# Patient Record
Sex: Female | Born: 1980
Health system: Southern US, Community
[De-identification: ages and names within clinical notes are randomized; demographics above are authoritative.]

## PROBLEM LIST (undated history)

## (undated) ENCOUNTER — Emergency Department (HOSPITAL_COMMUNITY): Discharge status: Absent without leave | Payer: 59

## (undated) DIAGNOSIS — I10 Essential (primary) hypertension: Secondary | ICD-10-CM

## (undated) HISTORY — DX: Essential (primary) hypertension: I10

---

## 2003-05-17 ENCOUNTER — Other Ambulatory Visit: Admission: RE | Admit: 2003-05-17 | Discharge: 2003-05-17 | Payer: Self-pay | Admitting: Internal Medicine

## 2005-08-05 ENCOUNTER — Emergency Department (HOSPITAL_COMMUNITY): Admission: EM | Admit: 2005-08-05 | Discharge: 2005-08-05 | Payer: Self-pay | Admitting: Family Medicine

## 2011-07-11 ENCOUNTER — Encounter (HOSPITAL_COMMUNITY): Payer: Self-pay

## 2011-07-11 ENCOUNTER — Emergency Department (HOSPITAL_COMMUNITY)
Admission: EM | Admit: 2011-07-11 | Discharge: 2011-07-11 | Disposition: A | Discharge status: Home / Self Care | Payer: 59 | Source: Home / Self Care | Attending: Family Medicine | Admitting: Family Medicine

## 2011-07-11 DIAGNOSIS — B373 Candidiasis of vulva and vagina: Secondary | ICD-10-CM

## 2011-07-11 LAB — POCT URINALYSIS DIP (DEVICE)
Glucose, UA: NEGATIVE mg/dL
Protein, ur: NEGATIVE mg/dL
Specific Gravity, Urine: 1.02 (ref 1.005–1.030)
Urobilinogen, UA: 1 mg/dL (ref 0.0–1.0)

## 2011-07-11 LAB — WET PREP, GENITAL: Yeast Wet Prep HPF POC: NONE SEEN

## 2011-07-11 MED ORDER — FLUCONAZOLE 150 MG PO TABS: 150.0000 mg | ORAL_TABLET | Freq: Once | ORAL | Status: AC

## 2011-07-11 MED ORDER — TERCONAZOLE 80 MG VA SUPP: 80.0000 mg | Freq: Every day | VAGINAL | Status: AC

## 2011-07-11 NOTE — ED Notes (Signed)
Call back number for lab issues verified 

## 2011-07-11 NOTE — ED Provider Notes (Signed)
History     CSN: [id number]  Arrival date & time 07/11/11  1225   First MD Initiated Contact with Patient 07/11/11 1243      Chief Complaint  Patient presents with  . Vaginal Discharge    (Consider location/radiation/quality/duration/timing/severity/associated sxs/prior treatment) Patient is a 31 y.o. female presenting with vaginal discharge. The history is provided by the patient.  Vaginal Discharge This is a new problem. The current episode started 2 days ago. The problem has not changed since onset.Treatments tried: has self treated but sx continue. The treatment provided no relief.    History reviewed. No pertinent past medical history.  History reviewed. No pertinent past surgical history.  History reviewed. No pertinent family history.  History  Substance Use Topics  . Smoking status: Current Everyday Smoker  . Smokeless tobacco: Not on file  . Alcohol Use: Yes    OB History    Grav Para Term Preterm Abortions TAB SAB Ect Mult Living                  Review of Systems  Constitutional: Negative.   Gastrointestinal: Negative.   Genitourinary: Positive for vaginal discharge. Negative for dysuria, urgency, frequency and vaginal pain.       Itching and thick d/c    Allergies  Review of patient's allergies indicates no known allergies.  Home Medications  No current outpatient prescriptions on file.  BP 125/73  Pulse 74  Temp(Src) 98 F (36.7 C) (Oral)  Resp 12  SpO2 100%  Physical Exam  Nursing note and vitals reviewed. Constitutional: She appears well-developed and well-nourished.  Abdominal: Soft. Bowel sounds are normal.  Genitourinary:    Vaginal discharge found.    ED Course  Procedures (including critical care time)  Labs Reviewed  WET PREP, GENITAL - Abnormal; Notable for the following:    Clue Cells Wet Prep HPF POC FEW (*)    WBC, Wet Prep HPF POC FEW (*)    All other components within normal limits  POCT URINALYSIS DIP (DEVICE) -  Abnormal; Notable for the following:    Hgb urine dipstick SMALL (*)    Nitrite POSITIVE (*)    Leukocytes, UA SMALL (*) Biochemical Testing Only. Please order routine urinalysis from main lab if confirmatory testing is needed.   All other components within normal limits  GC/CHLAMYDIA PROBE AMP, GENITAL  POCT PREGNANCY, URINE  LAB REPORT - SCANNED   No results found.   1. Candida vaginitis       MDM         Linna Hoff, MD 07/30/11 1907

## 2011-07-11 NOTE — ED Notes (Signed)
Sexually active w mostly compliant condom use; has been having white vaginal D/C for 3-4 days, treated w OTC meds w/o relief

## 2011-07-11 NOTE — Discharge Instructions (Signed)
Use medicine as prescribed, we will call if tests show a need for other treatment.  

## 2011-07-12 LAB — GC/CHLAMYDIA PROBE AMP, GENITAL: Chlamydia, DNA Probe: NEGATIVE

## 2011-11-17 ENCOUNTER — Ambulatory Visit (INDEPENDENT_AMBULATORY_CARE_PROVIDER_SITE_OTHER): Payer: 59 | Admitting: Family Medicine

## 2011-11-17 ENCOUNTER — Encounter: Payer: Self-pay | Admitting: Family Medicine

## 2011-11-17 VITALS — BP 112/68 | HR 90 | Temp 98.3°F | Ht 62.5 in | Wt 149.8 lb

## 2011-11-17 DIAGNOSIS — Z30011 Encounter for initial prescription of contraceptive pills: Secondary | ICD-10-CM

## 2011-11-17 DIAGNOSIS — L709 Acne, unspecified: Secondary | ICD-10-CM

## 2011-11-17 DIAGNOSIS — L708 Other acne: Secondary | ICD-10-CM

## 2011-11-17 DIAGNOSIS — F17200 Nicotine dependence, unspecified, uncomplicated: Secondary | ICD-10-CM

## 2011-11-17 DIAGNOSIS — Z720 Tobacco use: Secondary | ICD-10-CM

## 2011-11-17 DIAGNOSIS — Z0000 Encounter for general adult medical examination without abnormal findings: Secondary | ICD-10-CM

## 2011-11-17 DIAGNOSIS — Z3009 Encounter for other general counseling and advice on contraception: Secondary | ICD-10-CM

## 2011-11-17 MED ORDER — BENZOYL PEROXIDE-ERYTHROMYCIN 5-3 % EX GEL: Freq: Two times a day (BID) | CUTANEOUS | Status: DC

## 2011-11-17 MED ORDER — VARENICLINE TARTRATE 0.5 MG X 11 & 1 MG X 42 PO MISC: ORAL | Status: DC

## 2011-11-17 MED ORDER — NORGESTIM-ETH ESTRAD TRIPHASIC 0.18/0.215/0.25 MG-35 MCG PO TABS: 1.0000 | ORAL_TABLET | Freq: Every day | ORAL | Status: DC

## 2011-11-17 NOTE — Patient Instructions (Addendum)
Preventive Care for Adults, Female A healthy lifestyle and preventive care can promote health and wellness. Preventive health guidelines for women include the following key practices.  A routine yearly physical is a good way to check with your caregiver about your health and preventive screening. It is a chance to share any concerns and updates on your health, and to receive a thorough exam.   Visit your dentist for a routine exam and preventive care every 6 months. Brush your teeth twice a day and floss once a day. Good oral hygiene prevents tooth decay and gum disease.   The frequency of eye exams is based on your age, health, family medical history, use of contact lenses, and other factors. Follow your caregiver's recommendations for frequency of eye exams.   Eat a healthy diet. Foods like vegetables, fruits, whole grains, low-fat dairy products, and lean protein foods contain the nutrients you need without too many calories. Decrease your intake of foods high in solid fats, added sugars, and salt. Eat the right amount of calories for you.Get information about a proper diet from your caregiver, if necessary.   Regular physical exercise is one of the most important things you can do for your health. Most adults should get at least 150 minutes of moderate-intensity exercise (any activity that increases your heart rate and causes you to sweat) each week. In addition, most adults need muscle-strengthening exercises on 2 or more days a week.   Maintain a healthy weight. The body mass index (BMI) is a screening tool to identify possible weight problems. It provides an estimate of body fat based on height and weight. Your caregiver can help determine your BMI, and can help you achieve or maintain a healthy weight.For adults 20 years and older:   A BMI below 18.5 is considered underweight.   A BMI of 18.5 to 24.9 is normal.   A BMI of 25 to 29.9 is considered overweight.   A BMI of 30 and above is  considered obese.   Maintain normal blood lipids and cholesterol levels by exercising and minimizing your intake of saturated fat. Eat a balanced diet with plenty of fruit and vegetables. Blood tests for lipids and cholesterol should begin at age 20 and be repeated every 5 years. If your lipid or cholesterol levels are high, you are over 50, or you are at high risk for heart disease, you may need your cholesterol levels checked more frequently.Ongoing high lipid and cholesterol levels should be treated with medicines if diet and exercise are not effective.   If you smoke, find out from your caregiver how to quit. If you do not use tobacco, do not start.   If you are pregnant, do not drink alcohol. If you are breastfeeding, be very cautious about drinking alcohol. If you are not pregnant and choose to drink alcohol, do not exceed 1 drink per day. One drink is considered to be 12 ounces (355 mL) of beer, 5 ounces (148 mL) of wine, or 1.5 ounces (44 mL) of liquor.   Avoid use of street drugs. Do not share needles with anyone. Ask for help if you need support or instructions about stopping the use of drugs.   High blood pressure causes heart disease and increases the risk of stroke. Your blood pressure should be checked at least every 1 to 2 years. Ongoing high blood pressure should be treated with medicines if weight loss and exercise are not effective.   If you are 55 to 30   years old, ask your caregiver if you should take aspirin to prevent strokes.   Diabetes screening involves taking a blood sample to check your fasting blood sugar level. This should be done once every 3 years, after age 45, if you are within normal weight and without risk factors for diabetes. Testing should be considered at a younger age or be carried out more frequently if you are overweight and have at least 1 risk factor for diabetes.   Breast cancer screening is essential preventive care for women. You should practice "breast  self-awareness." This means understanding the normal appearance and feel of your breasts and may include breast self-examination. Any changes detected, no matter how small, should be reported to a caregiver. Women in their 20s and 30s should have a clinical breast exam (CBE) by a caregiver as part of a regular health exam every 1 to 3 years. After age 40, women should have a CBE every year. Starting at age 40, women should consider having a mammography (breast X-ray test) every year. Women who have a family history of breast cancer should talk to their caregiver about genetic screening. Women at a high risk of breast cancer should talk to their caregivers about having magnetic resonance imaging (MRI) and a mammography every year.   The Pap test is a screening test for cervical cancer. A Pap test can show cell changes on the cervix that might become cervical cancer if left untreated. A Pap test is a procedure in which cells are obtained and examined from the lower end of the uterus (cervix).   Women should have a Pap test starting at age 21.   Between ages 21 and 29, Pap tests should be repeated every 2 years.   Beginning at age 30, you should have a Pap test every 3 years as long as the past 3 Pap tests have been normal.   Some women have medical problems that increase the chance of getting cervical cancer. Talk to your caregiver about these problems. It is especially important to talk to your caregiver if a new problem develops soon after your last Pap test. In these cases, your caregiver may recommend more frequent screening and Pap tests.   The above recommendations are the same for women who have or have not gotten the vaccine for human papillomavirus (HPV).   If you had a hysterectomy for a problem that was not cancer or a condition that could lead to cancer, then you no longer need Pap tests. Even if you no longer need a Pap test, a regular exam is a good idea to make sure no other problems are  starting.   If you are between ages 65 and 70, and you have had normal Pap tests going back 10 years, you no longer need Pap tests. Even if you no longer need a Pap test, a regular exam is a good idea to make sure no other problems are starting.   If you have had past treatment for cervical cancer or a condition that could lead to cancer, you need Pap tests and screening for cancer for at least 20 years after your treatment.   If Pap tests have been discontinued, risk factors (such as a new sexual partner) need to be reassessed to determine if screening should be resumed.   The HPV test is an additional test that may be used for cervical cancer screening. The HPV test looks for the virus that can cause the cell changes on the cervix.   The cells collected during the Pap test can be tested for HPV. The HPV test could be used to screen women aged 30 years and older, and should be used in women of any age who have unclear Pap test results. After the age of 30, women should have HPV testing at the same frequency as a Pap test.   Colorectal cancer can be detected and often prevented. Most routine colorectal cancer screening begins at the age of 50 and continues through age 75. However, your caregiver may recommend screening at an earlier age if you have risk factors for colon cancer. On a yearly basis, your caregiver may provide home test kits to check for hidden blood in the stool. Use of a small camera at the end of a tube, to directly examine the colon (sigmoidoscopy or colonoscopy), can detect the earliest forms of colorectal cancer. Talk to your caregiver about this at age 50, when routine screening begins. Direct examination of the colon should be repeated every 5 to 10 years through age 75, unless early forms of pre-cancerous polyps or small growths are found.   Hepatitis C blood testing is recommended for all people born from 1945 through 1965 and any individual with known risks for hepatitis C.    Practice safe sex. Use condoms and avoid high-risk sexual practices to reduce the spread of sexually transmitted infections (STIs). STIs include gonorrhea, chlamydia, syphilis, trichomonas, herpes, HPV, and human immunodeficiency virus (HIV). Herpes, HIV, and HPV are viral illnesses that have no cure. They can result in disability, cancer, and death. Sexually active women aged 25 and younger should be checked for chlamydia. Older women with new or multiple partners should also be tested for chlamydia. Testing for other STIs is recommended if you are sexually active and at increased risk.   Osteoporosis is a disease in which the bones lose minerals and strength with aging. This can result in serious bone fractures. The risk of osteoporosis can be identified using a bone density scan. Women ages 65 and over and women at risk for fractures or osteoporosis should discuss screening with their caregivers. Ask your caregiver whether you should take a calcium supplement or vitamin D to reduce the rate of osteoporosis.   Menopause can be associated with physical symptoms and risks. Hormone replacement therapy is available to decrease symptoms and risks. You should talk to your caregiver about whether hormone replacement therapy is right for you.   Use sunscreen with sun protection factor (SPF) of 30 or more. Apply sunscreen liberally and repeatedly throughout the day. You should seek shade when your shadow is shorter than you. Protect yourself by wearing long sleeves, pants, a wide-brimmed hat, and sunglasses year round, whenever you are outdoors.   Once a month, do a whole body skin exam, using a mirror to look at the skin on your back. Notify your caregiver of new moles, moles that have irregular borders, moles that are larger than a pencil eraser, or moles that have changed in shape or color.   Stay current with required immunizations.   Influenza. You need a dose every fall (or winter). The composition of  the flu vaccine changes each year, so being vaccinated once is not enough.   Pneumococcal polysaccharide. You need 1 to 2 doses if you smoke cigarettes or if you have certain chronic medical conditions. You need 1 dose at age 65 (or older) if you have never been vaccinated.   Tetanus, diphtheria, pertussis (Tdap, Td). Get 1 dose of   Tdap vaccine if you are younger than age 65, are over 65 and have contact with an infant, are a healthcare worker, are pregnant, or simply want to be protected from whooping cough. After that, you need a Td booster dose every 10 years. Consult your caregiver if you have not had at least 3 tetanus and diphtheria-containing shots sometime in your life or have a deep or dirty wound.   HPV. You need this vaccine if you are a woman age 26 or younger. The vaccine is given in 3 doses over 6 months.   Measles, mumps, rubella (MMR). You need at least 1 dose of MMR if you were born in 1957 or later. You may also need a second dose.   Meningococcal. If you are age 19 to 21 and a first-year college student living in a residence hall, or have one of several medical conditions, you need to get vaccinated against meningococcal disease. You may also need additional booster doses.   Zoster (shingles). If you are age 60 or older, you should get this vaccine.   Varicella (chickenpox). If you have never had chickenpox or you were vaccinated but received only 1 dose, talk to your caregiver to find out if you need this vaccine.   Hepatitis A. You need this vaccine if you have a specific risk factor for hepatitis A virus infection or you simply wish to be protected from this disease. The vaccine is usually given as 2 doses, 6 to 18 months apart.   Hepatitis B. You need this vaccine if you have a specific risk factor for hepatitis B virus infection or you simply wish to be protected from this disease. The vaccine is given in 3 doses, usually over 6 months.  Preventive Services /  Frequency Ages 19 to 39  Blood pressure check.** / Every 1 to 2 years.   Lipid and cholesterol check.** / Every 5 years beginning at age 20.   Clinical breast exam.** / Every 3 years for women in their 20s and 30s.   Pap test.** / Every 2 years from ages 21 through 29. Every 3 years starting at age 30 through age 65 or 70 with a history of 3 consecutive normal Pap tests.   HPV screening.** / Every 3 years from ages 30 through ages 65 to 70 with a history of 3 consecutive normal Pap tests.   Hepatitis C blood test.** / For any individual with known risks for hepatitis C.   Skin self-exam. / Monthly.   Influenza immunization.** / Every year.   Pneumococcal polysaccharide immunization.** / 1 to 2 doses if you smoke cigarettes or if you have certain chronic medical conditions.   Tetanus, diphtheria, pertussis (Tdap, Td) immunization. / A one-time dose of Tdap vaccine. After that, you need a Td booster dose every 10 years.   HPV immunization. / 3 doses over 6 months, if you are 26 and younger.   Measles, mumps, rubella (MMR) immunization. / You need at least 1 dose of MMR if you were born in 1957 or later. You may also need a second dose.   Meningococcal immunization. / 1 dose if you are age 19 to 21 and a first-year college student living in a residence hall, or have one of several medical conditions, you need to get vaccinated against meningococcal disease. You may also need additional booster doses.   Varicella immunization.** / Consult your caregiver.   Hepatitis A immunization.** / Consult your caregiver. 2 doses, 6 to 18 months   apart.   Hepatitis B immunization.** / Consult your caregiver. 3 doses usually over 6 months.  Ages 40 to 64  Blood pressure check.** / Every 1 to 2 years.   Lipid and cholesterol check.** / Every 5 years beginning at age 20.   Clinical breast exam.** / Every year after age 40.   Mammogram.** / Every year beginning at age 40 and continuing for as  long as you are in good health. Consult with your caregiver.   Pap test.** / Every 3 years starting at age 30 through age 65 or 70 with a history of 3 consecutive normal Pap tests.   HPV screening.** / Every 3 years from ages 30 through ages 65 to 70 with a history of 3 consecutive normal Pap tests.   Fecal occult blood test (FOBT) of stool. / Every year beginning at age 50 and continuing until age 75. You may not need to do this test if you get a colonoscopy every 10 years.   Flexible sigmoidoscopy or colonoscopy.** / Every 5 years for a flexible sigmoidoscopy or every 10 years for a colonoscopy beginning at age 50 and continuing until age 75.   Hepatitis C blood test.** / For all people born from 1945 through 1965 and any individual with known risks for hepatitis C.   Skin self-exam. / Monthly.   Influenza immunization.** / Every year.   Pneumococcal polysaccharide immunization.** / 1 to 2 doses if you smoke cigarettes or if you have certain chronic medical conditions.   Tetanus, diphtheria, pertussis (Tdap, Td) immunization.** / A one-time dose of Tdap vaccine. After that, you need a Td booster dose every 10 years.   Measles, mumps, rubella (MMR) immunization. / You need at least 1 dose of MMR if you were born in 1957 or later. You may also need a second dose.   Varicella immunization.** / Consult your caregiver.   Meningococcal immunization.** / Consult your caregiver.   Hepatitis A immunization.** / Consult your caregiver. 2 doses, 6 to 18 months apart.   Hepatitis B immunization.** / Consult your caregiver. 3 doses, usually over 6 months.  Ages 65 and over  Blood pressure check.** / Every 1 to 2 years.   Lipid and cholesterol check.** / Every 5 years beginning at age 20.   Clinical breast exam.** / Every year after age 40.   Mammogram.** / Every year beginning at age 40 and continuing for as long as you are in good health. Consult with your caregiver.   Pap test.** /  Every 3 years starting at age 30 through age 65 or 70 with a 3 consecutive normal Pap tests. Testing can be stopped between 65 and 70 with 3 consecutive normal Pap tests and no abnormal Pap or HPV tests in the past 10 years.   HPV screening.** / Every 3 years from ages 30 through ages 65 or 70 with a history of 3 consecutive normal Pap tests. Testing can be stopped between 65 and 70 with 3 consecutive normal Pap tests and no abnormal Pap or HPV tests in the past 10 years.   Fecal occult blood test (FOBT) of stool. / Every year beginning at age 50 and continuing until age 75. You may not need to do this test if you get a colonoscopy every 10 years.   Flexible sigmoidoscopy or colonoscopy.** / Every 5 years for a flexible sigmoidoscopy or every 10 years for a colonoscopy beginning at age 50 and continuing until age 75.   Hepatitis   C blood test.** / For all people born from 1945 through 1965 and any individual with known risks for hepatitis C.   Osteoporosis screening.** / A one-time screening for women ages 65 and over and women at risk for fractures or osteoporosis.   Skin self-exam. / Monthly.   Influenza immunization.** / Every year.   Pneumococcal polysaccharide immunization.** / 1 dose at age 65 (or older) if you have never been vaccinated.   Tetanus, diphtheria, pertussis (Tdap, Td) immunization. / A one-time dose of Tdap vaccine if you are over 65 and have contact with an infant, are a healthcare worker, or simply want to be protected from whooping cough. After that, you need a Td booster dose every 10 years.   Varicella immunization.** / Consult your caregiver.   Meningococcal immunization.** / Consult your caregiver.   Hepatitis A immunization.** / Consult your caregiver. 2 doses, 6 to 18 months apart.   Hepatitis B immunization.** / Check with your caregiver. 3 doses, usually over 6 months.  ** Family history and personal history of risk and conditions may change your caregiver's  recommendations. Document Released: 05/20/2001 Document Revised: 03/13/2011 Document Reviewed: 08/19/2010 [employer] Patient Information 2012 [employer]. 

## 2011-11-17 NOTE — Progress Notes (Signed)
  Subjective:     Marissa Crawford is a 31 y.o. female and is here for a comprehensive physical exam. The patient reports no problems.  History   Social History  . Marital Status: Single    Spouse Name: N/A    Number of Children: N/A  . Years of Education: N/A   Occupational History  . Not on file.   Social History Main Topics  . Smoking status: Current Everyday Smoker -- 1.0 packs/day for 10 years    Types: Cigarettes  . Smokeless tobacco: Never Used  . Alcohol Use: Yes  . Drug Use: No  . Sexually Active: Yes    Birth Control/ Protection: Condom, None   Other Topics Concern  . Not on file   Social History Narrative  . No narrative on file   No health maintenance topics applied.  The following portions of the patient's history were reviewed and updated as appropriate: allergies, current medications, past family history, past medical history, past social history, past surgical history and problem list.  Review of Systems Review of Systems  Constitutional: Negative for activity change, appetite change and fatigue.  HENT: Negative for hearing loss, congestion, tinnitus and ear discharge.  dentist q1m Eyes: Negative for visual disturbance (see optho q1y -- vision corrected to 20/20 with glasses).  Respiratory: Negative for cough, chest tightness and shortness of breath.   Cardiovascular: Negative for chest pain, palpitations and leg swelling.  Gastrointestinal: Negative for abdominal pain, diarrhea, constipation and abdominal distention.  Genitourinary: Negative for urgency, frequency, decreased urine volume and difficulty urinating.  Musculoskeletal: Negative for back pain, arthralgias and gait problem.  Skin: Negative for color change, pallor and rash.  Neurological: Negative for dizziness, light-headedness, numbness and headaches.  Hematological: Negative for adenopathy. Does not bruise/bleed easily.  Psychiatric/Behavioral: Negative for suicidal ideas, confusion, sleep  disturbance, self-injury, dysphoric mood, decreased concentration and agitation.       Objective:    BP 112/68  Pulse 90  Temp 98.3 F (36.8 C) (Oral)  Ht 5' 2.5" (1.588 m)  Wt 149 lb 12.8 oz (67.949 kg)  BMI 26.96 kg/m2  SpO2 97%  LMP 11/14/2011 General appearance: alert, cooperative, appears stated age and no distress Head: Normocephalic, without obvious abnormality, atraumatic Eyes: negative findings: lids and lashes normal, conjunctivae and sclerae normal and pupils equal, round, reactive to light and accomodation Ears: normal TM's and external ear canals both ears Nose: Nares normal. Septum midline. Mucosa normal. No drainage or sinus tenderness. Throat: lips, mucosa, and tongue normal; teeth and gums normal Neck: no adenopathy, no carotid bruit, no JVD, supple, symmetrical, trachea midline and thyroid not enlarged, symmetric, no tenderness/mass/nodules Back: symmetric, no curvature. ROM normal. No CVA tenderness. Lungs: clear to auscultation bilaterally Breasts: normal appearance, no masses or tenderness Heart: regular rate and rhythm, S1, S2 normal, no murmur, click, rub or gallop Abdomen: soft, non-tender; bowel sounds normal; no masses,  no organomegaly Pelvic: deferred Extremities: extremities normal, atraumatic, no cyanosis or edema Pulses: 2+ and symmetric Skin: Skin color, texture, turgor normal. No rashes or lesions Lymph nodes: Cervical, supraclavicular, and axillary nodes normal. Neurologic: Alert and oriented X 3, normal strength and tone. Normal symmetric reflexes. Normal coordination and gait psych--no depression, anxiety    Assessment:    Healthy female exam.      Plan:    ghm utd Check labs See After Visit Summary for Counseling Recommendations

## 2011-11-26 ENCOUNTER — Encounter: Payer: Self-pay | Admitting: Family Medicine

## 2011-11-26 ENCOUNTER — Ambulatory Visit (INDEPENDENT_AMBULATORY_CARE_PROVIDER_SITE_OTHER): Payer: 59 | Admitting: Family Medicine

## 2011-11-26 ENCOUNTER — Other Ambulatory Visit (HOSPITAL_COMMUNITY)
Admission: RE | Admit: 2011-11-26 | Discharge: 2011-11-26 | Disposition: A | Discharge status: Home / Self Care | Payer: 59 | Source: Ambulatory Visit | Attending: Family Medicine | Admitting: Family Medicine

## 2011-11-26 ENCOUNTER — Other Ambulatory Visit: Payer: Self-pay | Admitting: Family Medicine

## 2011-11-26 VITALS — BP 108/62 | HR 76 | Temp 98.5°F | Wt 143.8 lb

## 2011-11-26 DIAGNOSIS — Z01419 Encounter for gynecological examination (general) (routine) without abnormal findings: Secondary | ICD-10-CM | POA: Insufficient documentation

## 2011-11-26 DIAGNOSIS — Z113 Encounter for screening for infections with a predominantly sexual mode of transmission: Secondary | ICD-10-CM | POA: Insufficient documentation

## 2011-11-26 DIAGNOSIS — Z0000 Encounter for general adult medical examination without abnormal findings: Secondary | ICD-10-CM

## 2011-11-26 DIAGNOSIS — N760 Acute vaginitis: Secondary | ICD-10-CM | POA: Insufficient documentation

## 2011-11-26 DIAGNOSIS — L689 Hypertrichosis, unspecified: Secondary | ICD-10-CM

## 2011-11-26 DIAGNOSIS — L680 Hirsutism: Secondary | ICD-10-CM

## 2011-11-26 DIAGNOSIS — B9689 Other specified bacterial agents as the cause of diseases classified elsewhere: Secondary | ICD-10-CM

## 2011-11-26 DIAGNOSIS — A499 Bacterial infection, unspecified: Secondary | ICD-10-CM

## 2011-11-26 DIAGNOSIS — Z124 Encounter for screening for malignant neoplasm of cervix: Secondary | ICD-10-CM

## 2011-11-26 LAB — LIPID PANEL
Cholesterol: 146 mg/dL (ref 0–200)
HDL: 48.5 mg/dL (ref >39.00)
Triglycerides: 48 mg/dL (ref 0.0–149.0)

## 2011-11-26 LAB — POCT URINALYSIS DIPSTICK
Bilirubin, UA: NEGATIVE
Glucose, UA: NEGATIVE
Ketones, UA: NEGATIVE
pH, UA: 6

## 2011-11-26 LAB — BASIC METABOLIC PANEL
CO2: 25 mEq/L (ref 19–32)
Calcium: 9.7 mg/dL (ref 8.4–10.5)
Glucose, Bld: 69 mg/dL — ABNORMAL LOW (ref 70–99)
Sodium: 139 mEq/L (ref 135–145)

## 2011-11-26 LAB — CBC WITH DIFFERENTIAL/PLATELET
Basophils Absolute: 0 10*3/uL (ref 0.0–0.1)
Eosinophils Absolute: 0.3 10*3/uL (ref 0.0–0.7)
Hemoglobin: 13.3 g/dL (ref 12.0–15.0)
Lymphocytes Relative: 37.1 % (ref 12.0–46.0)
Lymphs Abs: 2.8 10*3/uL (ref 0.7–4.0)
MCHC: 32.8 g/dL (ref 30.0–36.0)
MCV: 100.4 fl — ABNORMAL HIGH (ref 78.0–100.0)
Monocytes Absolute: 0.5 10*3/uL (ref 0.1–1.0)
Neutro Abs: 3.9 10*3/uL (ref 1.4–7.7)
RDW: 13.2 % (ref 11.5–14.6)

## 2011-11-26 LAB — HEPATIC FUNCTION PANEL
Albumin: 4.7 g/dL (ref 3.5–5.2)
Alkaline Phosphatase: 41 U/L (ref 39–117)

## 2011-11-26 MED ORDER — FLUCONAZOLE 150 MG PO TABS: 150.0000 mg | ORAL_TABLET | Freq: Once | ORAL | Status: AC

## 2011-11-26 MED ORDER — METRONIDAZOLE 0.75 % VA GEL: VAGINAL | Status: DC

## 2011-11-26 MED ORDER — METRONIDAZOLE 0.75 % VA GEL: 1.0000 | Freq: Two times a day (BID) | VAGINAL | Status: DC

## 2011-11-26 NOTE — Progress Notes (Deleted)
Subjective:     Marissa Crawford is a 31 y.o. female and is here for a comprehensive physical exam. The patient reports no problems.  History   Social History  . Marital Status: Single    Spouse Name: N/A    Number of Children: N/A  . Years of Education: N/A   Occupational History  . Not on file.   Social History Main Topics  . Smoking status: Current Everyday Smoker -- 1.0 packs/day for 10 years    Types: Cigarettes  . Smokeless tobacco: Never Used  . Alcohol Use: Yes  . Drug Use: No  . Sexually Active: Yes -- Female partner(s)    Birth Control/ Protection: Condom, None   Other Topics Concern  . Not on file   Social History Narrative   Exercise ---cardio   Health Maintenance  Topic Date Due  . Influenza Vaccine  01/06/2012  . Pap Smear  11/25/2012  . Tetanus/tdap  04/08/2015    The following portions of the patient's history were reviewed and updated as appropriate: allergies, current medications, past family history, past medical history, past social history, past surgical history and problem list.  Review of Systems Review of Systems  Constitutional: Negative for activity change, appetite change and fatigue.  HENT: Negative for hearing loss, congestion, tinnitus and ear discharge.  dentist q27m Eyes: Negative for visual disturbance (see optho q1y -- vision corrected to 20/20 with glasses).  Respiratory: Negative for cough, chest tightness and shortness of breath.   Cardiovascular: Negative for chest pain, palpitations and leg swelling.  Gastrointestinal: Negative for abdominal pain, diarrhea, constipation and abdominal distention.  Genitourinary: Negative for urgency, frequency, decreased urine volume and difficulty urinating.  Musculoskeletal: Negative for back pain, arthralgias and gait problem.  Skin: Negative for color change, pallor and rash.  Neurological: Negative for dizziness, light-headedness, numbness and headaches.  Hematological: Negative for adenopathy. Does  not bruise/bleed easily.  Psychiatric/Behavioral: Negative for suicidal ideas, confusion, sleep disturbance, self-injury, dysphoric mood, decreased concentration and agitation.       Objective:    BP 108/62  Pulse 76  Temp 98.5 F (36.9 C) (Oral)  Wt 143 lb 12.8 oz (65.227 kg)  SpO2 98%  LMP 11/14/2011 General appearance: {general exam:16600} Head: {head exam:30909::"Normocephalic, without obvious abnormality","atraumatic"} Eyes: {eye exam:201::"conjunctivae/corneas clear. PERRL, EOM's intact. Fundi benign."} Ears: {ear exam:5207::"normal TM's and external ear canals both ears"} Nose: {nose exam:13771::"Nares normal. Septum midline. Mucosa normal. No drainage or sinus tenderness."} Throat: {throat exam:17160::"lips, mucosa, and tongue normal; teeth and gums normal"} Neck: {neck exam:17463::"no adenopathy","no carotid bruit","no JVD","supple, symmetrical, trachea midline","thyroid not enlarged, symmetric, no tenderness/mass/nodules"} Back: {back exam:801::"symmetric, no curvature. ROM normal. No CVA tenderness."} Lungs: {lung exam:16931} Breasts: {breast exam:13139::"normal appearance, no masses or tenderness"} Heart: {heart exam:5510} Abdomen: {abdominal exam:16834} Pelvic: {pelvic exam:16852::"cervix normal in appearance","external genitalia normal","no adnexal masses or tenderness","no cervical motion tenderness","rectovaginal septum normal","uterus normal size, shape, and consistency","vagina normal without discharge"} Extremities: {extremity exam:5109} Pulses: {pulse exam:10866::"2+ and symmetric"} Skin: {skin exam:31329::"Skin color, texture, turgor normal. No rashes or lesions"} Lymph nodes: {lymph node exam:14039::"Cervical, supraclavicular, and axillary nodes normal."} Neurologic: {neuro exam:17854} ***    Assessment:    Healthy female exam. ***     Plan:     See After Visit Summary for Counseling Recommendations    Subjective:     Marissa Crawford is a 31 y.o. woman  who comes in today for a  pap smear only. Her most recent annual exam was about 2 weeks ago. Her most recent Pap smear  was last year and showed no abnormalities. Previous abnormal Pap smears: no. Contraception: condoms  The following portions of the patient's history were reviewed and updated as appropriate: allergies, current medications, past family history, past medical history, past social history, past surgical history and problem list.  Review of Systems Pertinent items are noted in HPI.   Objective:    BP 108/62  Pulse 76  Temp 98.5 F (36.9 C) (Oral)  Wt 143 lb 12.8 oz (65.227 kg)  SpO2 98%  LMP 11/14/2011 Pelvic Exam: cervix normal in appearance, external genitalia normal, no adnexal masses or tenderness, no bladder tenderness, no cervical motion tenderness and vagina normal without discharge. Pap smear obtained.   Assessment:    Screening pap smear.   Plan:    Follow up in 1 year, or as indicated by Pap results.

## 2011-11-26 NOTE — Progress Notes (Signed)
  Subjective:     Marissa Crawford is a 31 y.o. woman who comes in today for a  pap smear only. Her most recent annual exam was about 2 weeks ago. Her most recent Pap smear was last year and showed no abnormalities. Previous abnormal Pap smears: no. Contraception: condoms  The following portions of the patient's history were reviewed and updated as appropriate: allergies, current medications, past family history, past medical history, past social history, past surgical history and problem list.  Review of Systems Pertinent items are noted in HPI.   Objective:    BP 108/62  Pulse 76  Temp 98.5 F (36.9 C) (Oral)  Wt 143 lb 12.8 oz (65.227 kg)  SpO2 98%  LMP 11/14/2011 Pelvic Exam: cervix normal in appearance, external genitalia normal, no adnexal masses or tenderness, no bladder tenderness, no cervical motion tenderness, uterus normal size, shape, and consistency and + vaginal d/c. Pap smear obtained.   Assessment:    Screening pap smear.  BV-- metrogel Plan:    Follow up in 1 year, or as indicated by Pap results.

## 2011-11-26 NOTE — Telephone Encounter (Signed)
Pharm needs verification  on directions for METRONIDAZOLLE .75

## 2011-11-26 NOTE — Telephone Encounter (Signed)
Clarified directions with Dr.Lowne and resent Rx.      KP

## 2011-11-26 NOTE — Patient Instructions (Signed)
Preventive Care for Adults, Female A healthy lifestyle and preventive care can promote health and wellness. Preventive health guidelines for women include the following key practices.  A routine yearly physical is a good way to check with your caregiver about your health and preventive screening. It is a chance to share any concerns and updates on your health, and to receive a thorough exam.   Visit your dentist for a routine exam and preventive care every 6 months. Brush your teeth twice a day and floss once a day. Good oral hygiene prevents tooth decay and gum disease.   The frequency of eye exams is based on your age, health, family medical history, use of contact lenses, and other factors. Follow your caregiver's recommendations for frequency of eye exams.   Eat a healthy diet. Foods like vegetables, fruits, whole grains, low-fat dairy products, and lean protein foods contain the nutrients you need without too many calories. Decrease your intake of foods high in solid fats, added sugars, and salt. Eat the right amount of calories for you.Get information about a proper diet from your caregiver, if necessary.   Regular physical exercise is one of the most important things you can do for your health. Most adults should get at least 150 minutes of moderate-intensity exercise (any activity that increases your heart rate and causes you to sweat) each week. In addition, most adults need muscle-strengthening exercises on 2 or more days a week.   Maintain a healthy weight. The body mass index (BMI) is a screening tool to identify possible weight problems. It provides an estimate of body fat based on height and weight. Your caregiver can help determine your BMI, and can help you achieve or maintain a healthy weight.For adults 20 years and older:   A BMI below 18.5 is considered underweight.   A BMI of 18.5 to 24.9 is normal.   A BMI of 25 to 29.9 is considered overweight.   A BMI of 30 and above is  considered obese.   Maintain normal blood lipids and cholesterol levels by exercising and minimizing your intake of saturated fat. Eat a balanced diet with plenty of fruit and vegetables. Blood tests for lipids and cholesterol should begin at age 20 and be repeated every 5 years. If your lipid or cholesterol levels are high, you are over 50, or you are at high risk for heart disease, you may need your cholesterol levels checked more frequently.Ongoing high lipid and cholesterol levels should be treated with medicines if diet and exercise are not effective.   If you smoke, find out from your caregiver how to quit. If you do not use tobacco, do not start.   If you are pregnant, do not drink alcohol. If you are breastfeeding, be very cautious about drinking alcohol. If you are not pregnant and choose to drink alcohol, do not exceed 1 drink per day. One drink is considered to be 12 ounces (355 mL) of beer, 5 ounces (148 mL) of wine, or 1.5 ounces (44 mL) of liquor.   Avoid use of street drugs. Do not share needles with anyone. Ask for help if you need support or instructions about stopping the use of drugs.   High blood pressure causes heart disease and increases the risk of stroke. Your blood pressure should be checked at least every 1 to 2 years. Ongoing high blood pressure should be treated with medicines if weight loss and exercise are not effective.   If you are 55 to 30   years old, ask your caregiver if you should take aspirin to prevent strokes.   Diabetes screening involves taking a blood sample to check your fasting blood sugar level. This should be done once every 3 years, after age 45, if you are within normal weight and without risk factors for diabetes. Testing should be considered at a younger age or be carried out more frequently if you are overweight and have at least 1 risk factor for diabetes.   Breast cancer screening is essential preventive care for women. You should practice "breast  self-awareness." This means understanding the normal appearance and feel of your breasts and may include breast self-examination. Any changes detected, no matter how small, should be reported to a caregiver. Women in their 20s and 30s should have a clinical breast exam (CBE) by a caregiver as part of a regular health exam every 1 to 3 years. After age 40, women should have a CBE every year. Starting at age 40, women should consider having a mammography (breast X-ray test) every year. Women who have a family history of breast cancer should talk to their caregiver about genetic screening. Women at a high risk of breast cancer should talk to their caregivers about having magnetic resonance imaging (MRI) and a mammography every year.   The Pap test is a screening test for cervical cancer. A Pap test can show cell changes on the cervix that might become cervical cancer if left untreated. A Pap test is a procedure in which cells are obtained and examined from the lower end of the uterus (cervix).   Women should have a Pap test starting at age 21.   Between ages 21 and 29, Pap tests should be repeated every 2 years.   Beginning at age 30, you should have a Pap test every 3 years as long as the past 3 Pap tests have been normal.   Some women have medical problems that increase the chance of getting cervical cancer. Talk to your caregiver about these problems. It is especially important to talk to your caregiver if a new problem develops soon after your last Pap test. In these cases, your caregiver may recommend more frequent screening and Pap tests.   The above recommendations are the same for women who have or have not gotten the vaccine for human papillomavirus (HPV).   If you had a hysterectomy for a problem that was not cancer or a condition that could lead to cancer, then you no longer need Pap tests. Even if you no longer need a Pap test, a regular exam is a good idea to make sure no other problems are  starting.   If you are between ages 65 and 70, and you have had normal Pap tests going back 10 years, you no longer need Pap tests. Even if you no longer need a Pap test, a regular exam is a good idea to make sure no other problems are starting.   If you have had past treatment for cervical cancer or a condition that could lead to cancer, you need Pap tests and screening for cancer for at least 20 years after your treatment.   If Pap tests have been discontinued, risk factors (such as a new sexual partner) need to be reassessed to determine if screening should be resumed.   The HPV test is an additional test that may be used for cervical cancer screening. The HPV test looks for the virus that can cause the cell changes on the cervix.   The cells collected during the Pap test can be tested for HPV. The HPV test could be used to screen women aged 30 years and older, and should be used in women of any age who have unclear Pap test results. After the age of 30, women should have HPV testing at the same frequency as a Pap test.   Colorectal cancer can be detected and often prevented. Most routine colorectal cancer screening begins at the age of 50 and continues through age 75. However, your caregiver may recommend screening at an earlier age if you have risk factors for colon cancer. On a yearly basis, your caregiver may provide home test kits to check for hidden blood in the stool. Use of a small camera at the end of a tube, to directly examine the colon (sigmoidoscopy or colonoscopy), can detect the earliest forms of colorectal cancer. Talk to your caregiver about this at age 50, when routine screening begins. Direct examination of the colon should be repeated every 5 to 10 years through age 75, unless early forms of pre-cancerous polyps or small growths are found.   Hepatitis C blood testing is recommended for all people born from 1945 through 1965 and any individual with known risks for hepatitis C.    Practice safe sex. Use condoms and avoid high-risk sexual practices to reduce the spread of sexually transmitted infections (STIs). STIs include gonorrhea, chlamydia, syphilis, trichomonas, herpes, HPV, and human immunodeficiency virus (HIV). Herpes, HIV, and HPV are viral illnesses that have no cure. They can result in disability, cancer, and death. Sexually active women aged 25 and younger should be checked for chlamydia. Older women with new or multiple partners should also be tested for chlamydia. Testing for other STIs is recommended if you are sexually active and at increased risk.   Osteoporosis is a disease in which the bones lose minerals and strength with aging. This can result in serious bone fractures. The risk of osteoporosis can be identified using a bone density scan. Women ages 65 and over and women at risk for fractures or osteoporosis should discuss screening with their caregivers. Ask your caregiver whether you should take a calcium supplement or vitamin D to reduce the rate of osteoporosis.   Menopause can be associated with physical symptoms and risks. Hormone replacement therapy is available to decrease symptoms and risks. You should talk to your caregiver about whether hormone replacement therapy is right for you.   Use sunscreen with sun protection factor (SPF) of 30 or more. Apply sunscreen liberally and repeatedly throughout the day. You should seek shade when your shadow is shorter than you. Protect yourself by wearing long sleeves, pants, a wide-brimmed hat, and sunglasses year round, whenever you are outdoors.   Once a month, do a whole body skin exam, using a mirror to look at the skin on your back. Notify your caregiver of new moles, moles that have irregular borders, moles that are larger than a pencil eraser, or moles that have changed in shape or color.   Stay current with required immunizations.   Influenza. You need a dose every fall (or winter). The composition of  the flu vaccine changes each year, so being vaccinated once is not enough.   Pneumococcal polysaccharide. You need 1 to 2 doses if you smoke cigarettes or if you have certain chronic medical conditions. You need 1 dose at age 65 (or older) if you have never been vaccinated.   Tetanus, diphtheria, pertussis (Tdap, Td). Get 1 dose of   Tdap vaccine if you are younger than age 65, are over 65 and have contact with an infant, are a healthcare worker, are pregnant, or simply want to be protected from whooping cough. After that, you need a Td booster dose every 10 years. Consult your caregiver if you have not had at least 3 tetanus and diphtheria-containing shots sometime in your life or have a deep or dirty wound.   HPV. You need this vaccine if you are a woman age 26 or younger. The vaccine is given in 3 doses over 6 months.   Measles, mumps, rubella (MMR). You need at least 1 dose of MMR if you were born in 1957 or later. You may also need a second dose.   Meningococcal. If you are age 19 to 21 and a first-year college student living in a residence hall, or have one of several medical conditions, you need to get vaccinated against meningococcal disease. You may also need additional booster doses.   Zoster (shingles). If you are age 60 or older, you should get this vaccine.   Varicella (chickenpox). If you have never had chickenpox or you were vaccinated but received only 1 dose, talk to your caregiver to find out if you need this vaccine.   Hepatitis A. You need this vaccine if you have a specific risk factor for hepatitis A virus infection or you simply wish to be protected from this disease. The vaccine is usually given as 2 doses, 6 to 18 months apart.   Hepatitis B. You need this vaccine if you have a specific risk factor for hepatitis B virus infection or you simply wish to be protected from this disease. The vaccine is given in 3 doses, usually over 6 months.  Preventive Services /  Frequency Ages 19 to 39  Blood pressure check.** / Every 1 to 2 years.   Lipid and cholesterol check.** / Every 5 years beginning at age 20.   Clinical breast exam.** / Every 3 years for women in their 20s and 30s.   Pap test.** / Every 2 years from ages 21 through 29. Every 3 years starting at age 30 through age 65 or 70 with a history of 3 consecutive normal Pap tests.   HPV screening.** / Every 3 years from ages 30 through ages 65 to 70 with a history of 3 consecutive normal Pap tests.   Hepatitis C blood test.** / For any individual with known risks for hepatitis C.   Skin self-exam. / Monthly.   Influenza immunization.** / Every year.   Pneumococcal polysaccharide immunization.** / 1 to 2 doses if you smoke cigarettes or if you have certain chronic medical conditions.   Tetanus, diphtheria, pertussis (Tdap, Td) immunization. / A one-time dose of Tdap vaccine. After that, you need a Td booster dose every 10 years.   HPV immunization. / 3 doses over 6 months, if you are 26 and younger.   Measles, mumps, rubella (MMR) immunization. / You need at least 1 dose of MMR if you were born in 1957 or later. You may also need a second dose.   Meningococcal immunization. / 1 dose if you are age 19 to 21 and a first-year college student living in a residence hall, or have one of several medical conditions, you need to get vaccinated against meningococcal disease. You may also need additional booster doses.   Varicella immunization.** / Consult your caregiver.   Hepatitis A immunization.** / Consult your caregiver. 2 doses, 6 to 18 months   apart.   Hepatitis B immunization.** / Consult your caregiver. 3 doses usually over 6 months.  Ages 40 to 64  Blood pressure check.** / Every 1 to 2 years.   Lipid and cholesterol check.** / Every 5 years beginning at age 20.   Clinical breast exam.** / Every year after age 40.   Mammogram.** / Every year beginning at age 40 and continuing for as  long as you are in good health. Consult with your caregiver.   Pap test.** / Every 3 years starting at age 30 through age 65 or 70 with a history of 3 consecutive normal Pap tests.   HPV screening.** / Every 3 years from ages 30 through ages 65 to 70 with a history of 3 consecutive normal Pap tests.   Fecal occult blood test (FOBT) of stool. / Every year beginning at age 50 and continuing until age 75. You may not need to do this test if you get a colonoscopy every 10 years.   Flexible sigmoidoscopy or colonoscopy.** / Every 5 years for a flexible sigmoidoscopy or every 10 years for a colonoscopy beginning at age 50 and continuing until age 75.   Hepatitis C blood test.** / For all people born from 1945 through 1965 and any individual with known risks for hepatitis C.   Skin self-exam. / Monthly.   Influenza immunization.** / Every year.   Pneumococcal polysaccharide immunization.** / 1 to 2 doses if you smoke cigarettes or if you have certain chronic medical conditions.   Tetanus, diphtheria, pertussis (Tdap, Td) immunization.** / A one-time dose of Tdap vaccine. After that, you need a Td booster dose every 10 years.   Measles, mumps, rubella (MMR) immunization. / You need at least 1 dose of MMR if you were born in 1957 or later. You may also need a second dose.   Varicella immunization.** / Consult your caregiver.   Meningococcal immunization.** / Consult your caregiver.   Hepatitis A immunization.** / Consult your caregiver. 2 doses, 6 to 18 months apart.   Hepatitis B immunization.** / Consult your caregiver. 3 doses, usually over 6 months.  Ages 65 and over  Blood pressure check.** / Every 1 to 2 years.   Lipid and cholesterol check.** / Every 5 years beginning at age 20.   Clinical breast exam.** / Every year after age 40.   Mammogram.** / Every year beginning at age 40 and continuing for as long as you are in good health. Consult with your caregiver.   Pap test.** /  Every 3 years starting at age 30 through age 65 or 70 with a 3 consecutive normal Pap tests. Testing can be stopped between 65 and 70 with 3 consecutive normal Pap tests and no abnormal Pap or HPV tests in the past 10 years.   HPV screening.** / Every 3 years from ages 30 through ages 65 or 70 with a history of 3 consecutive normal Pap tests. Testing can be stopped between 65 and 70 with 3 consecutive normal Pap tests and no abnormal Pap or HPV tests in the past 10 years.   Fecal occult blood test (FOBT) of stool. / Every year beginning at age 50 and continuing until age 75. You may not need to do this test if you get a colonoscopy every 10 years.   Flexible sigmoidoscopy or colonoscopy.** / Every 5 years for a flexible sigmoidoscopy or every 10 years for a colonoscopy beginning at age 50 and continuing until age 75.   Hepatitis   C blood test.** / For all people born from 1945 through 1965 and any individual with known risks for hepatitis C.   Osteoporosis screening.** / A one-time screening for women ages 65 and over and women at risk for fractures or osteoporosis.   Skin self-exam. / Monthly.   Influenza immunization.** / Every year.   Pneumococcal polysaccharide immunization.** / 1 dose at age 65 (or older) if you have never been vaccinated.   Tetanus, diphtheria, pertussis (Tdap, Td) immunization. / A one-time dose of Tdap vaccine if you are over 65 and have contact with an infant, are a healthcare worker, or simply want to be protected from whooping cough. After that, you need a Td booster dose every 10 years.   Varicella immunization.** / Consult your caregiver.   Meningococcal immunization.** / Consult your caregiver.   Hepatitis A immunization.** / Consult your caregiver. 2 doses, 6 to 18 months apart.   Hepatitis B immunization.** / Check with your caregiver. 3 doses, usually over 6 months.  ** Family history and personal history of risk and conditions may change your caregiver's  recommendations. Document Released: 05/20/2001 Document Revised: 03/13/2011 Document Reviewed: 08/19/2010 [employer] Patient Information 2012 [employer]. 

## 2011-11-27 LAB — HIV ANTIBODY (ROUTINE TESTING W REFLEX): HIV: NONREACTIVE

## 2011-11-27 LAB — TESTOSTERONE, FREE, TOTAL, SHBG
Testosterone, Free: 5.7 pg/mL (ref 0.6–6.8)
Testosterone-% Free: 0.9 % (ref 0.4–2.4)
Testosterone: 66.96 ng/dL (ref 10–70)

## 2011-11-27 LAB — RPR

## 2011-11-27 LAB — HSV 2 ANTIBODY, IGG: HSV 2 Glycoprotein G Ab, IgG: 7.41 IV — ABNORMAL HIGH

## 2011-11-28 ENCOUNTER — Telehealth: Payer: Self-pay

## 2011-11-28 DIAGNOSIS — Z720 Tobacco use: Secondary | ICD-10-CM

## 2011-11-28 MED ORDER — VARENICLINE TARTRATE 0.5 MG X 11 & 1 MG X 42 PO MISC: ORAL | Status: AC

## 2011-11-28 NOTE — Telephone Encounter (Signed)
Message left on triage voicemail, patient contacted the pharmacy and all rx's received except Chantix, please resubmit [facility]

## 2011-12-03 ENCOUNTER — Telehealth: Payer: Self-pay

## 2011-12-03 MED ORDER — METRONIDAZOLE 500 MG PO TABS: 500.0000 mg | ORAL_TABLET | Freq: Two times a day (BID) | ORAL | Status: AC

## 2011-12-03 NOTE — Telephone Encounter (Signed)
Message copied by Arnette Norris on Wed Dec 03, 2011  1:20 PM ------      Message from: Lelon Perla      Created: Mon Dec 01, 2011 12:46 PM       + trich on pap--- change metrogel to flagyl 500 mg bid for 7 days

## 2011-12-03 NOTE — Telephone Encounter (Signed)
HSV + ---- repeat in 3-4 weeks   Discussed results with patient and she voiced understanding.  Rx faxed    KP

## 2012-04-21 ENCOUNTER — Other Ambulatory Visit: Payer: Self-pay | Admitting: Family Medicine

## 2012-05-22 ENCOUNTER — Other Ambulatory Visit: Payer: Self-pay

## 2012-12-01 ENCOUNTER — Other Ambulatory Visit (HOSPITAL_COMMUNITY)
Admission: RE | Admit: 2012-12-01 | Discharge: 2012-12-01 | Disposition: A | Discharge status: Home / Self Care | Payer: 59 | Source: Ambulatory Visit | Attending: Family Medicine | Admitting: Family Medicine

## 2012-12-01 ENCOUNTER — Encounter: Payer: Self-pay | Admitting: Family Medicine

## 2012-12-01 ENCOUNTER — Ambulatory Visit (INDEPENDENT_AMBULATORY_CARE_PROVIDER_SITE_OTHER): Payer: 59 | Admitting: Family Medicine

## 2012-12-01 VITALS — BP 120/80 | HR 97 | Temp 98.3°F | Ht 61.5 in | Wt 148.0 lb

## 2012-12-01 DIAGNOSIS — N760 Acute vaginitis: Secondary | ICD-10-CM | POA: Insufficient documentation

## 2012-12-01 DIAGNOSIS — Z7251 High risk heterosexual behavior: Secondary | ICD-10-CM

## 2012-12-01 DIAGNOSIS — Z113 Encounter for screening for infections with a predominantly sexual mode of transmission: Secondary | ICD-10-CM | POA: Insufficient documentation

## 2012-12-01 DIAGNOSIS — R319 Hematuria, unspecified: Secondary | ICD-10-CM

## 2012-12-01 DIAGNOSIS — R87810 Cervical high risk human papillomavirus (HPV) DNA test positive: Secondary | ICD-10-CM | POA: Insufficient documentation

## 2012-12-01 DIAGNOSIS — Z01419 Encounter for gynecological examination (general) (routine) without abnormal findings: Secondary | ICD-10-CM | POA: Insufficient documentation

## 2012-12-01 DIAGNOSIS — Z0000 Encounter for general adult medical examination without abnormal findings: Secondary | ICD-10-CM

## 2012-12-01 DIAGNOSIS — Z124 Encounter for screening for malignant neoplasm of cervix: Secondary | ICD-10-CM

## 2012-12-01 DIAGNOSIS — Z1151 Encounter for screening for human papillomavirus (HPV): Secondary | ICD-10-CM | POA: Insufficient documentation

## 2012-12-01 LAB — CBC WITH DIFFERENTIAL/PLATELET
Basophils Relative: 0.4 % (ref 0.0–3.0)
Eosinophils Absolute: 0.3 10*3/uL (ref 0.0–0.7)
Lymphocytes Relative: 48.4 % — ABNORMAL HIGH (ref 12.0–46.0)
MCHC: 34.1 g/dL (ref 30.0–36.0)
MCV: 98.1 fl (ref 78.0–100.0)
Monocytes Absolute: 0.5 10*3/uL (ref 0.1–1.0)
Neutrophils Relative %: 40.2 % — ABNORMAL LOW (ref 43.0–77.0)
Platelets: 252 10*3/uL (ref 150.0–400.0)
RBC: 4 Mil/uL (ref 3.87–5.11)
WBC: 7.6 10*3/uL (ref 4.5–10.5)

## 2012-12-01 LAB — POCT URINALYSIS DIPSTICK
Bilirubin, UA: NEGATIVE
Leukocytes, UA: NEGATIVE
Nitrite, UA: NEGATIVE
Protein, UA: NEGATIVE
Urobilinogen, UA: 0.2
pH, UA: 6

## 2012-12-01 NOTE — Progress Notes (Signed)
Subjective:     Marissa Crawford is a 32 y.o. female and is here for a comprehensive physical exam. The patient reports problems - condom broke --concerned about STD.  History   Social History  . Marital Status: Single    Spouse Name: N/A    Number of Children: N/A  . Years of Education: N/A   Occupational History  . 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 Smoking status: Current Every Day Smoker -- 1.00 packs/day for 11 years    Types: Cigarettes  . Smokeless tobacco: Never Used  . Alcohol Use: 1.0 oz/week    2 drink(s) per week  . Drug Use: No  . Sexual Activity: Yes    Partners: Male    Birth Control/ Protection: Condom, None   Other Topics Concern  . Not on file   Social History Narrative   Exercise ---cardio   Health Maintenance  Topic Date Due  . Influenza Vaccine  11/05/2012  . Pap Smear  11/25/2012  . Tetanus/tdap  04/08/2015    The following portions of the patient's history were reviewed and updated as appropriate:  She  has no past medical history on file. She  does not have a problem list on file. She  has no past surgical history on file. Her family history includes Alcohol abuse in her father; Cancer (age of onset: 63) in her mother; Cervical cancer in her mother; Diabetes in her brother, maternal grandfather, maternal grandmother, paternal grandfather, and paternal grandmother; Gout in her father; Hypertension in her father; Seizures in her sister; Stroke in her maternal grandfather and paternal grandmother. She  reports that she has been smoking Cigarettes.  She has a 11 pack-year smoking history. She has never used smokeless tobacco. She reports that she drinks about 1.0 ounces of alcohol per week. She reports that she does not use illicit drugs. She has a current medication list which includes the following prescription(s): benzoyl peroxide-erythromycin and multivitamin. Current Outpatient Prescriptions on File Prior to Visit  Medication  Sig Dispense Refill  . benzoyl peroxide-erythromycin (BENZAMYCIN) gel APPLY TOPICALLY 2 TIMES DAILY.  46.6 g  0  . Multiple Vitamin (MULTIVITAMIN) tablet Take 1 tablet by mouth daily.       No current facility-administered medications on file prior to visit.   She has No Known Allergies..  Review of Systems Review of Systems  Constitutional: Negative for activity change, appetite change and fatigue.  HENT: Negative for hearing loss, congestion, tinnitus and ear discharge.  dentist q42m Eyes: Negative for visual disturbance (see optho q1y -- vision corrected to 20/20 with glasses).  Respiratory: Negative for cough, chest tightness and shortness of breath.   Cardiovascular: Negative for chest pain, palpitations and leg swelling.  Gastrointestinal: Negative for abdominal pain, diarrhea, constipation and abdominal distention.  Genitourinary: Negative for urgency, frequency, decreased urine volume and difficulty urinating.  Musculoskeletal: Negative for back pain, arthralgias and gait problem.  Skin: Negative for color change, pallor and rash.  Neurological: Negative for dizziness, light-headedness, numbness and headaches.  Hematological: Negative for adenopathy. Does not bruise/bleed easily.  Psychiatric/Behavioral: Negative for suicidal ideas, confusion, sleep disturbance, self-injury, dysphoric mood, decreased concentration and agitation.       Objective:    BP 120/80  Pulse 97  Temp(Src) 98.3 F (36.8 C) (Oral)  Ht 5' 1.5" (1.562 m)  Wt 148 lb (67.132 kg)  BMI 27.51 kg/m2  SpO2 98%  LMP 11/18/2012 General appearance: alert, cooperative, appears stated age  and no distress Head: Normocephalic, without obvious abnormality, atraumatic Eyes: conjunctivae/corneas clear. PERRL, EOM's intact. Fundi benign. Ears: normal TM's and external ear canals both ears Nose: Nares normal. Septum midline. Mucosa normal. No drainage or sinus tenderness. Throat: lips, mucosa, and tongue normal;  teeth and gums normal Neck: no adenopathy, no carotid bruit, no JVD, supple, symmetrical, trachea midline and thyroid not enlarged, symmetric, no tenderness/mass/nodules Back: symmetric, no curvature. ROM normal. No CVA tenderness. Lungs: clear to auscultation bilaterally Breasts: normal appearance, no masses or tenderness Heart: regular rate and rhythm, S1, S2 normal, no murmur, click, rub or gallop Abdomen: soft, non-tender; bowel sounds normal; no masses,  no organomegaly Pelvic: cervix normal in appearance, external genitalia normal, no adnexal masses or tenderness, no cervical motion tenderness, rectovaginal septum normal, uterus normal size, shape, and consistency and vagina normal without discharge Extremities: extremities normal, atraumatic, no cyanosis or edema Pulses: 2+ and symmetric Skin: Skin color, texture, turgor normal. No rashes or lesions Lymph nodes: Cervical, supraclavicular, and axillary nodes normal. Neurologic: Alert and oriented X 3, normal strength and tone. Normal symmetric reflexes. Normal coordination and gait    Assessment:    Healthy female exam.      Plan:  [facility] utd Check labs including STD Pt will con't using condoms--- she does not want bcp-- will consider other options and call us with decision Pt also does not want valtrex etc   See After Visit Summary for Counseling Recommendations

## 2012-12-01 NOTE — Addendum Note (Signed)
Addended by: Silvio Pate D on: 12/01/2012 02:40 PM   Modules accepted: Orders

## 2012-12-01 NOTE — Addendum Note (Signed)
Addended by: Arnette Norris on: 12/01/2012 05:13 PM   Modules accepted: Orders

## 2012-12-01 NOTE — Patient Instructions (Addendum)
Preventive Care for Adults, Female A healthy lifestyle and preventive care can promote health and wellness. Preventive health guidelines for women include the following key practices.  A routine yearly physical is a good way to check with your caregiver about your health and preventive screening. It is a chance to share any concerns and updates on your health, and to receive a thorough exam.  Visit your dentist for a routine exam and preventive care every 6 months. Brush your teeth twice a day and floss once a day. Good oral hygiene prevents tooth decay and gum disease.  The frequency of eye exams is based on your age, health, family medical history, use of contact lenses, and other factors. Follow your caregiver's recommendations for frequency of eye exams.  Eat a healthy diet. Foods like vegetables, fruits, whole grains, low-fat dairy products, and lean protein foods contain the nutrients you need without too many calories. Decrease your intake of foods high in solid fats, added sugars, and salt. Eat the right amount of calories for you.Get information about a proper diet from your caregiver, if necessary.  Regular physical exercise is one of the most important things you can do for your health. Most adults should get at least 150 minutes of moderate-intensity exercise (any activity that increases your heart rate and causes you to sweat) each week. In addition, most adults need muscle-strengthening exercises on 2 or more days a week.  Maintain a healthy weight. The body mass index (BMI) is a screening tool to identify possible weight problems. It provides an estimate of body fat based on height and weight. Your caregiver can help determine your BMI, and can help you achieve or maintain a healthy weight.For adults 20 years and older:  A BMI below 18.5 is considered underweight.  A BMI of 18.5 to 24.9 is normal.  A BMI of 25 to 29.9 is considered overweight.  A BMI of 30 and above is  considered obese.  Maintain normal blood lipids and cholesterol levels by exercising and minimizing your intake of saturated fat. Eat a balanced diet with plenty of fruit and vegetables. Blood tests for lipids and cholesterol should begin at age 20 and be repeated every 5 years. If your lipid or cholesterol levels are high, you are over 50, or you are at high risk for heart disease, you may need your cholesterol levels checked more frequently.Ongoing high lipid and cholesterol levels should be treated with medicines if diet and exercise are not effective.  If you smoke, find out from your caregiver how to quit. If you do not use tobacco, do not start.  If you are pregnant, do not drink alcohol. If you are breastfeeding, be very cautious about drinking alcohol. If you are not pregnant and choose to drink alcohol, do not exceed 1 drink per day. One drink is considered to be 12 ounces (355 mL) of beer, 5 ounces (148 mL) of wine, or 1.5 ounces (44 mL) of liquor.  Avoid use of street drugs. Do not share needles with anyone. Ask for help if you need support or instructions about stopping the use of drugs.  High blood pressure causes heart disease and increases the risk of stroke. Your blood pressure should be checked at least every 1 to 2 years. Ongoing high blood pressure should be treated with medicines if weight loss and exercise are not effective.  If you are 55 to 31 years old, ask your caregiver if you should take aspirin to prevent strokes.  Diabetes   screening involves taking a blood sample to check your fasting blood sugar level. This should be done once every 3 years, after age 45, if you are within normal weight and without risk factors for diabetes. Testing should be considered at a younger age or be carried out more frequently if you are overweight and have at least 1 risk factor for diabetes.  Breast cancer screening is essential preventive care for women. You should practice "breast  self-awareness." This means understanding the normal appearance and feel of your breasts and may include breast self-examination. Any changes detected, no matter how small, should be reported to a caregiver. Women in their 20s and 30s should have a clinical breast exam (CBE) by a caregiver as part of a regular health exam every 1 to 3 years. After age 40, women should have a CBE every year. Starting at age 40, women should consider having a mammography (breast X-ray test) every year. Women who have a family history of breast cancer should talk to their caregiver about genetic screening. Women at a high risk of breast cancer should talk to their caregivers about having magnetic resonance imaging (MRI) and a mammography every year.  The Pap test is a screening test for cervical cancer. A Pap test can show cell changes on the cervix that might become cervical cancer if left untreated. A Pap test is a procedure in which cells are obtained and examined from the lower end of the uterus (cervix).  Women should have a Pap test starting at age 21.  Between ages 21 and 29, Pap tests should be repeated every 2 years.  Beginning at age 30, you should have a Pap test every 3 years as long as the past 3 Pap tests have been normal.  Some women have medical problems that increase the chance of getting cervical cancer. Talk to your caregiver about these problems. It is especially important to talk to your caregiver if a new problem develops soon after your last Pap test. In these cases, your caregiver may recommend more frequent screening and Pap tests.  The above recommendations are the same for women who have or have not gotten the vaccine for human papillomavirus (HPV).  If you had a hysterectomy for a problem that was not cancer or a condition that could lead to cancer, then you no longer need Pap tests. Even if you no longer need a Pap test, a regular exam is a good idea to make sure no other problems are  starting.  If you are between ages 65 and 70, and you have had normal Pap tests going back 10 years, you no longer need Pap tests. Even if you no longer need a Pap test, a regular exam is a good idea to make sure no other problems are starting.  If you have had past treatment for cervical cancer or a condition that could lead to cancer, you need Pap tests and screening for cancer for at least 20 years after your treatment.  If Pap tests have been discontinued, risk factors (such as a new sexual partner) need to be reassessed to determine if screening should be resumed.  The HPV test is an additional test that may be used for cervical cancer screening. The HPV test looks for the virus that can cause the cell changes on the cervix. The cells collected during the Pap test can be tested for HPV. The HPV test could be used to screen women aged 30 years and older, and should   be used in women of any age who have unclear Pap test results. After the age of 30, women should have HPV testing at the same frequency as a Pap test.  Colorectal cancer can be detected and often prevented. Most routine colorectal cancer screening begins at the age of 50 and continues through age 75. However, your caregiver may recommend screening at an earlier age if you have risk factors for colon cancer. On a yearly basis, your caregiver may provide home test kits to check for hidden blood in the stool. Use of a small camera at the end of a tube, to directly examine the colon (sigmoidoscopy or colonoscopy), can detect the earliest forms of colorectal cancer. Talk to your caregiver about this at age 50, when routine screening begins. Direct examination of the colon should be repeated every 5 to 10 years through age 75, unless early forms of pre-cancerous polyps or small growths are found.  Hepatitis C blood testing is recommended for all people born from 1945 through 1965 and any individual with known risks for hepatitis C.  Practice  safe sex. Use condoms and avoid high-risk sexual practices to reduce the spread of sexually transmitted infections (STIs). STIs include gonorrhea, chlamydia, syphilis, trichomonas, herpes, HPV, and human immunodeficiency virus (HIV). Herpes, HIV, and HPV are viral illnesses that have no cure. They can result in disability, cancer, and death. Sexually active women aged 25 and younger should be checked for chlamydia. Older women with new or multiple partners should also be tested for chlamydia. Testing for other STIs is recommended if you are sexually active and at increased risk.  Osteoporosis is a disease in which the bones lose minerals and strength with aging. This can result in serious bone fractures. The risk of osteoporosis can be identified using a bone density scan. Women ages 65 and over and women at risk for fractures or osteoporosis should discuss screening with their caregivers. Ask your caregiver whether you should take a calcium supplement or vitamin D to reduce the rate of osteoporosis.  Menopause can be associated with physical symptoms and risks. Hormone replacement therapy is available to decrease symptoms and risks. You should talk to your caregiver about whether hormone replacement therapy is right for you.  Use sunscreen with sun protection factor (SPF) of 30 or more. Apply sunscreen liberally and repeatedly throughout the day. You should seek shade when your shadow is shorter than you. Protect yourself by wearing long sleeves, pants, a wide-brimmed hat, and sunglasses year round, whenever you are outdoors.  Once a month, do a whole body skin exam, using a mirror to look at the skin on your back. Notify your caregiver of new moles, moles that have irregular borders, moles that are larger than a pencil eraser, or moles that have changed in shape or color.  Stay current with required immunizations.  Influenza. You need a dose every fall (or winter). The composition of the flu vaccine  changes each year, so being vaccinated once is not enough.  Pneumococcal polysaccharide. You need 1 to 2 doses if you smoke cigarettes or if you have certain chronic medical conditions. You need 1 dose at age 65 (or older) if you have never been vaccinated.  Tetanus, diphtheria, pertussis (Tdap, Td). Get 1 dose of Tdap vaccine if you are younger than age 65, are over 65 and have contact with an infant, are a healthcare worker, are pregnant, or simply want to be protected from whooping cough. After that, you need a Td   booster dose every 10 years. Consult your caregiver if you have not had at least 3 tetanus and diphtheria-containing shots sometime in your life or have a deep or dirty wound.  HPV. You need this vaccine if you are a woman age 26 or younger. The vaccine is given in 3 doses over 6 months.  Measles, mumps, rubella (MMR). You need at least 1 dose of MMR if you were born in 1957 or later. You may also need a second dose.  Meningococcal. If you are age 19 to 21 and a first-year college student living in a residence hall, or have one of several medical conditions, you need to get vaccinated against meningococcal disease. You may also need additional booster doses.  Zoster (shingles). If you are age 60 or older, you should get this vaccine.  Varicella (chickenpox). If you have never had chickenpox or you were vaccinated but received only 1 dose, talk to your caregiver to find out if you need this vaccine.  Hepatitis A. You need this vaccine if you have a specific risk factor for hepatitis A virus infection or you simply wish to be protected from this disease. The vaccine is usually given as 2 doses, 6 to 18 months apart.  Hepatitis B. You need this vaccine if you have a specific risk factor for hepatitis B virus infection or you simply wish to be protected from this disease. The vaccine is given in 3 doses, usually over 6 months. Preventive Services / Frequency Ages 19 to 39  Blood  pressure check.** / Every 1 to 2 years.  Lipid and cholesterol check.** / Every 5 years beginning at age 20.  Clinical breast exam.** / Every 3 years for women in their 20s and 30s.  Pap test.** / Every 2 years from ages 21 through 29. Every 3 years starting at age 30 through age 65 or 70 with a history of 3 consecutive normal Pap tests.  HPV screening.** / Every 3 years from ages 30 through ages 65 to 70 with a history of 3 consecutive normal Pap tests.  Hepatitis C blood test.** / For any individual with known risks for hepatitis C.  Skin self-exam. / Monthly.  Influenza immunization.** / Every year.  Pneumococcal polysaccharide immunization.** / 1 to 2 doses if you smoke cigarettes or if you have certain chronic medical conditions.  Tetanus, diphtheria, pertussis (Tdap, Td) immunization. / A one-time dose of Tdap vaccine. After that, you need a Td booster dose every 10 years.  HPV immunization. / 3 doses over 6 months, if you are 26 and younger.  Measles, mumps, rubella (MMR) immunization. / You need at least 1 dose of MMR if you were born in 1957 or later. You may also need a second dose.  Meningococcal immunization. / 1 dose if you are age 19 to 21 and a first-year college student living in a residence hall, or have one of several medical conditions, you need to get vaccinated against meningococcal disease. You may also need additional booster doses.  Varicella immunization.** / Consult your caregiver.  Hepatitis A immunization.** / Consult your caregiver. 2 doses, 6 to 18 months apart.  Hepatitis B immunization.** / Consult your caregiver. 3 doses usually over 6 months. Ages 40 to 64  Blood pressure check.** / Every 1 to 2 years.  Lipid and cholesterol check.** / Every 5 years beginning at age 20.  Clinical breast exam.** / Every year after age 40.  Mammogram.** / Every year beginning at age 40   and continuing for as long as you are in good health. Consult with your  caregiver.  Pap test.** / Every 3 years starting at age 30 through age 65 or 70 with a history of 3 consecutive normal Pap tests.  HPV screening.** / Every 3 years from ages 30 through ages 65 to 70 with a history of 3 consecutive normal Pap tests.  Fecal occult blood test (FOBT) of stool. / Every year beginning at age 50 and continuing until age 75. You may not need to do this test if you get a colonoscopy every 10 years.  Flexible sigmoidoscopy or colonoscopy.** / Every 5 years for a flexible sigmoidoscopy or every 10 years for a colonoscopy beginning at age 50 and continuing until age 75.  Hepatitis C blood test.** / For all people born from 1945 through 1965 and any individual with known risks for hepatitis C.  Skin self-exam. / Monthly.  Influenza immunization.** / Every year.  Pneumococcal polysaccharide immunization.** / 1 to 2 doses if you smoke cigarettes or if you have certain chronic medical conditions.  Tetanus, diphtheria, pertussis (Tdap, Td) immunization.** / A one-time dose of Tdap vaccine. After that, you need a Td booster dose every 10 years.  Measles, mumps, rubella (MMR) immunization. / You need at least 1 dose of MMR if you were born in 1957 or later. You may also need a second dose.  Varicella immunization.** / Consult your caregiver.  Meningococcal immunization.** / Consult your caregiver.  Hepatitis A immunization.** / Consult your caregiver. 2 doses, 6 to 18 months apart.  Hepatitis B immunization.** / Consult your caregiver. 3 doses, usually over 6 months. Ages 65 and over  Blood pressure check.** / Every 1 to 2 years.  Lipid and cholesterol check.** / Every 5 years beginning at age 20.  Clinical breast exam.** / Every year after age 40.  Mammogram.** / Every year beginning at age 40 and continuing for as long as you are in good health. Consult with your caregiver.  Pap test.** / Every 3 years starting at age 30 through age 65 or 70 with a 3  consecutive normal Pap tests. Testing can be stopped between 65 and 70 with 3 consecutive normal Pap tests and no abnormal Pap or HPV tests in the past 10 years.  HPV screening.** / Every 3 years from ages 30 through ages 65 or 70 with a history of 3 consecutive normal Pap tests. Testing can be stopped between 65 and 70 with 3 consecutive normal Pap tests and no abnormal Pap or HPV tests in the past 10 years.  Fecal occult blood test (FOBT) of stool. / Every year beginning at age 50 and continuing until age 75. You may not need to do this test if you get a colonoscopy every 10 years.  Flexible sigmoidoscopy or colonoscopy.** / Every 5 years for a flexible sigmoidoscopy or every 10 years for a colonoscopy beginning at age 50 and continuing until age 75.  Hepatitis C blood test.** / For all people born from 1945 through 1965 and any individual with known risks for hepatitis C.  Osteoporosis screening.** / A one-time screening for women ages 65 and over and women at risk for fractures or osteoporosis.  Skin self-exam. / Monthly.  Influenza immunization.** / Every year.  Pneumococcal polysaccharide immunization.** / 1 dose at age 65 (or older) if you have never been vaccinated.  Tetanus, diphtheria, pertussis (Tdap, Td) immunization. / A one-time dose of Tdap vaccine if you are over   65 and have contact with an infant, are a Research scientist (physical sciences), or simply want to be protected from whooping cough. After that, you need a Td booster dose every 10 years.  Varicella immunization.** / Consult your caregiver.  Meningococcal immunization.** / Consult your caregiver.  Hepatitis A immunization.** / Consult your caregiver. 2 doses, 6 to 18 months apart.  Hepatitis B immunization.** / Check with your caregiver. 3 doses, usually over 6 months. ** Family history and personal history of risk and conditions may change your caregiver's recommendations. Document Released: 05/20/2001 Document Revised: 06/16/2011  Document Reviewed: 08/19/2010 [facility] Patient Information 2014 [place], [place].  Contraceptive Implant Information A contraceptive implant is a plastic rod that is inserted under the skin. It is usually inserted under the skin of your upper arm. It continually releases small amounts of progestin (synthetic progesterone) into the bloodstream. This prevents an egg from being released from the ovary. It also thickens the cervical mucus to prevent sperm from entering the cervix, and it thins the uterine lining to prevent a fertilized egg from attaching to the uterus. They can be effective for up to 3 years. Implants do not provide protection against sexually transmitted diseases (STDs).  The procedure to insert an implant usually takes about 10 minutes. There may be minor bruising, swelling, and discomfort at the insertion site for a couple days. The implant begins to work within the first day. Other contraceptive protection should be used for 2 weeks. Follow up with your caregiver to get rechecked as directed. Your caregiver will make sure you are a good candidate for the contraceptive implant. Discuss with your caregiver the possible side effects of the implant ADVANTAGES  It prevents pregnancy for up to 3 years.  It is easily reversible.  It is convenient.  The progestins may protect against uterine and ovarian cancer.  It can be used when breastfeeding.  It can be used by women who cannot take estrogen. DISADVANTAGES  You may have irregular or unplanned vaginal bleeding.  You may develop side effects, including headache, weight gain, acne, breast tenderness, or mood changes.  You may have tissue or nerve damage after insertion (rare).  It may be difficult and uncomfortable to remove.  Certain medications may interfere with the effectiveness of the implants. REMOVAL OF IMPLANT The implant should be removed in 3 years or as directed by your caregiver. The implants effect wears off in  a few hours after removal. Your ability to get pregnant (fertility) is restored within a couple of weeks. New implants can be inserted as soon as the old ones are removed if desired. DO NOT GET THE IMPLANT IF:   You are pregnant.  You have a history of breast cancer, osteoporosis, blood clots, heart disease, diabetes, high blood pressure, liver disease, tumors, or stroke.   You have undiagnosed vaginal bleeding.  You have overly sensitive to certain parts of the implant. Document Released: 03/13/2011 Document Revised: 06/16/2011 Document Reviewed: 03/13/2011 [facility] Patient Information 2014 [place], [place].  Intrauterine Device Information An intrauterine device (IUD) is inserted into your uterus and prevents pregnancy. There are 2 types of IUDs available:  Copper IUD. This type of IUD is wrapped in copper wire and is placed inside the uterus. Copper makes the uterus and fallopian tubes produce a fluid that kills sperm. The copper IUD can stay in place for 10 years.  Hormone IUD. This type of IUD contains the hormone progestin (synthetic progesterone). The hormone thickens the cervical mucus and prevents sperm from entering  the uterus, and it also thins the uterine lining to prevent implantation of a fertilized egg. The hormone can weaken or kill the sperm that get into the uterus. The hormone IUD can stay in place for 5 years. Your caregiver will make sure you are a good candidate for a contraceptive IUD. Discuss with your caregiver the possible side effects. ADVANTAGES  It is highly effective, reversible, long-acting, and low maintenance.  There are no estrogen-related side effects.  An IUD can be used when breastfeeding.  It is not associated with weight gain.  It works immediately after insertion.  The copper IUD does not interfere with your female hormones.  The progesterone IUD can make heavy menstrual periods lighter.  The progesterone IUD can be used for 5 years.  The  copper IUD can be used for 10 years. DISADVANTAGES  The progesterone IUD can be associated with irregular bleeding patterns.  The copper IUD can make your menstrual flow heavier and more painful.  You may experience cramping and vaginal bleeding after insertion. Document Released: 02/26/2004 Document Revised: 06/16/2011 Document Reviewed: 07/27/2010 [facility] Patient Information 2014 [place], [place].

## 2012-12-02 LAB — LIPID PANEL
HDL: 49.6 mg/dL (ref >39.00)
LDL Cholesterol: 113 mg/dL — ABNORMAL HIGH (ref 0–99)
Total CHOL/HDL Ratio: 3
Triglycerides: 48 mg/dL (ref 0.0–149.0)
VLDL: 9.6 mg/dL (ref 0.0–40.0)

## 2012-12-02 LAB — BASIC METABOLIC PANEL
BUN: 21 mg/dL (ref 6–23)
CO2: 26 mEq/L (ref 19–32)
Calcium: 9.5 mg/dL (ref 8.4–10.5)
Creatinine, Ser: 0.8 mg/dL (ref 0.4–1.2)

## 2012-12-02 LAB — HIV ANTIBODY (ROUTINE TESTING W REFLEX): HIV: NONREACTIVE

## 2012-12-02 LAB — HEPATIC FUNCTION PANEL
Alkaline Phosphatase: 42 U/L (ref 39–117)
Bilirubin, Direct: 0.1 mg/dL (ref 0.0–0.3)
Total Bilirubin: 0.6 mg/dL (ref 0.3–1.2)
Total Protein: 7.9 g/dL (ref 6.0–8.3)

## 2012-12-02 LAB — TSH: TSH: 5.06 u[IU]/mL (ref 0.35–5.50)

## 2012-12-04 ENCOUNTER — Other Ambulatory Visit: Payer: Self-pay | Admitting: Family Medicine

## 2012-12-04 DIAGNOSIS — B9689 Other specified bacterial agents as the cause of diseases classified elsewhere: Secondary | ICD-10-CM

## 2012-12-04 MED ORDER — METRONIDAZOLE 0.75 % VA GEL: VAGINAL | Status: DC

## 2012-12-08 ENCOUNTER — Telehealth: Payer: Self-pay

## 2012-12-08 DIAGNOSIS — IMO0002 Reserved for concepts with insufficient information to code with codable children: Secondary | ICD-10-CM

## 2012-12-08 MED ORDER — FLUCONAZOLE 150 MG PO TABS: ORAL_TABLET | ORAL | Status: DC

## 2012-12-08 MED ORDER — METRONIDAZOLE 500 MG PO TABS: 500.0000 mg | ORAL_TABLET | Freq: Two times a day (BID) | ORAL | Status: DC

## 2012-12-08 NOTE — Telephone Encounter (Signed)
Notes Recorded by Lelon Perla, DO on 12/07/2012 at 2:41 PM Flagyl 500 mg 1 po bid for 7 days ---+ trich +HPV--- refer to gyn Notes Recorded by Lelon Perla, DO on 12/04/2012 at 10:52 AM BV---metrogel sent to pharmacy Rest of the labs are good Notes Recorded by Lelon Perla, DO on 12/02/2012 at 10:00 AM HIV negative

## 2012-12-08 NOTE — Telephone Encounter (Signed)
Message copied by Arnette Norris on Wed Dec 08, 2012  9:25 AM ------      Message from: Lelon Perla      Created: Sat Dec 04, 2012 10:52 AM       BV---metrogel sent to pharmacy      Rest of the labs are good ------

## 2012-12-15 ENCOUNTER — Other Ambulatory Visit: Payer: Self-pay | Admitting: Family Medicine

## 2013-02-10 ENCOUNTER — Other Ambulatory Visit: Payer: Self-pay

## 2013-12-05 ENCOUNTER — Ambulatory Visit (INDEPENDENT_AMBULATORY_CARE_PROVIDER_SITE_OTHER): Payer: 59 | Admitting: Family Medicine

## 2013-12-05 ENCOUNTER — Encounter: Payer: Self-pay | Admitting: Family Medicine

## 2013-12-05 ENCOUNTER — Other Ambulatory Visit (HOSPITAL_COMMUNITY)
Admission: RE | Admit: 2013-12-05 | Discharge: 2013-12-05 | Disposition: A | Discharge status: Home / Self Care | Payer: 59 | Source: Ambulatory Visit | Attending: Family Medicine | Admitting: Family Medicine

## 2013-12-05 VITALS — BP 120/80 | HR 94 | Temp 98.6°F | Ht 63.0 in | Wt 158.1 lb

## 2013-12-05 DIAGNOSIS — Z0000 Encounter for general adult medical examination without abnormal findings: Secondary | ICD-10-CM

## 2013-12-05 DIAGNOSIS — Z01419 Encounter for gynecological examination (general) (routine) without abnormal findings: Secondary | ICD-10-CM | POA: Insufficient documentation

## 2013-12-05 DIAGNOSIS — Z113 Encounter for screening for infections with a predominantly sexual mode of transmission: Secondary | ICD-10-CM | POA: Diagnosis present

## 2013-12-05 DIAGNOSIS — Z124 Encounter for screening for malignant neoplasm of cervix: Secondary | ICD-10-CM

## 2013-12-05 DIAGNOSIS — Z1151 Encounter for screening for human papillomavirus (HPV): Secondary | ICD-10-CM | POA: Diagnosis present

## 2013-12-05 DIAGNOSIS — R87810 Cervical high risk human papillomavirus (HPV) DNA test positive: Secondary | ICD-10-CM | POA: Diagnosis not present

## 2013-12-05 LAB — CBC WITH DIFFERENTIAL/PLATELET
BASOS PCT: 0.3 % (ref 0.0–3.0)
Basophils Absolute: 0 10*3/uL (ref 0.0–0.1)
EOS PCT: 3.1 % (ref 0.0–5.0)
Eosinophils Absolute: 0.2 10*3/uL (ref 0.0–0.7)
HEMATOCRIT: 39.2 % (ref 36.0–46.0)
HEMOGLOBIN: 13 g/dL (ref 12.0–15.0)
LYMPHS ABS: 2.1 10*3/uL (ref 0.7–4.0)
Lymphocytes Relative: 35.3 % (ref 12.0–46.0)
MCHC: 33.2 g/dL (ref 30.0–36.0)
MCV: 99.5 fl (ref 78.0–100.0)
MONO ABS: 0.6 10*3/uL (ref 0.1–1.0)
Monocytes Relative: 9.5 % (ref 3.0–12.0)
NEUTROS ABS: 3.1 10*3/uL (ref 1.4–7.7)
Neutrophils Relative %: 51.8 % (ref 43.0–77.0)
Platelets: 286 10*3/uL (ref 150.0–400.0)
RBC: 3.94 Mil/uL (ref 3.87–5.11)
RDW: 13.6 % (ref 11.5–15.5)
WBC: 5.9 10*3/uL (ref 4.0–10.5)

## 2013-12-05 LAB — HEPATIC FUNCTION PANEL
ALT: 12 U/L (ref 0–35)
AST: 25 U/L (ref 0–37)
Albumin: 4.2 g/dL (ref 3.5–5.2)
Alkaline Phosphatase: 45 U/L (ref 39–117)
Bilirubin, Direct: 0.1 mg/dL (ref 0.0–0.3)
TOTAL PROTEIN: 7.4 g/dL (ref 6.0–8.3)
Total Bilirubin: 0.8 mg/dL (ref 0.2–1.2)

## 2013-12-05 LAB — BASIC METABOLIC PANEL
BUN: 11 mg/dL (ref 6–23)
CHLORIDE: 105 meq/L (ref 96–112)
CO2: 26 mEq/L (ref 19–32)
Calcium: 9.6 mg/dL (ref 8.4–10.5)
Creatinine, Ser: 0.8 mg/dL (ref 0.4–1.2)
GFR: 103.8 mL/min (ref >60.00)
Glucose, Bld: 77 mg/dL (ref 70–99)
POTASSIUM: 3.6 meq/L (ref 3.5–5.1)
SODIUM: 140 meq/L (ref 135–145)

## 2013-12-05 LAB — LIPID PANEL
CHOLESTEROL: 156 mg/dL (ref 0–200)
HDL: 46.5 mg/dL (ref >39.00)
LDL Cholesterol: 95 mg/dL (ref 0–99)
NonHDL: 109.5
TRIGLYCERIDES: 71 mg/dL (ref 0.0–149.0)
Total CHOL/HDL Ratio: 3
VLDL: 14.2 mg/dL (ref 0.0–40.0)

## 2013-12-05 LAB — TSH: TSH: 1.91 u[IU]/mL (ref 0.35–4.50)

## 2013-12-05 NOTE — Patient Instructions (Signed)
Preventive Care for Adults A healthy lifestyle and preventive care can promote health and wellness. Preventive health guidelines for women include the following key practices.  A routine yearly physical is a good way to check with your health care provider about your health and preventive screening. It is a chance to share any concerns and updates on your health and to receive a thorough exam.  Visit your dentist for a routine exam and preventive care every 6 months. Brush your teeth twice a day and floss once a day. Good oral hygiene prevents tooth decay and gum disease.  The frequency of eye exams is based on your age, health, family medical history, use of contact lenses, and other factors. Follow your health care provider's recommendations for frequency of eye exams.  Eat a healthy diet. Foods like vegetables, fruits, whole grains, low-fat dairy products, and lean protein foods contain the nutrients you need without too many calories. Decrease your intake of foods high in solid fats, added sugars, and salt. Eat the right amount of calories for you.Get information about a proper diet from your health care provider, if necessary.  Regular physical exercise is one of the most important things you can do for your health. Most adults should get at least 150 minutes of moderate-intensity exercise (any activity that increases your heart rate and causes you to sweat) each week. In addition, most adults need muscle-strengthening exercises on 2 or more days a week.  Maintain a healthy weight. The body mass index (BMI) is a screening tool to identify possible weight problems. It provides an estimate of body fat based on height and weight. Your health care provider can find your BMI and can help you achieve or maintain a healthy weight.For adults 20 years and older:  A BMI below 18.5 is considered underweight.  A BMI of 18.5 to 24.9 is normal.  A BMI of 25 to 29.9 is considered overweight.  A BMI of  30 and above is considered obese.  Maintain normal blood lipids and cholesterol levels by exercising and minimizing your intake of saturated fat. Eat a balanced diet with plenty of fruit and vegetables. Blood tests for lipids and cholesterol should begin at age 76 and be repeated every 5 years. If your lipid or cholesterol levels are high, you are over 50, or you are at high risk for heart disease, you may need your cholesterol levels checked more frequently.Ongoing high lipid and cholesterol levels should be treated with medicines if diet and exercise are not working.  If you smoke, find out from your health care provider how to quit. If you do not use tobacco, do not start.  Lung cancer screening is recommended for adults aged 22-80 years who are at high risk for developing lung cancer because of a history of smoking. A yearly low-dose CT scan of the lungs is recommended for people who have at least a 30-pack-year history of smoking and are a current smoker or have quit within the past 15 years. A pack year of smoking is smoking an average of 1 pack of cigarettes a day for 1 year (for example: 1 pack a day for 30 years or 2 packs a day for 15 years). Yearly screening should continue until the smoker has stopped smoking for at least 15 years. Yearly screening should be stopped for people who develop a health problem that would prevent them from having lung cancer treatment.  If you are pregnant, do not drink alcohol. If you are breastfeeding,  be very cautious about drinking alcohol. If you are not pregnant and choose to drink alcohol, do not have more than 1 drink per day. One drink is considered to be 12 ounces (355 mL) of beer, 5 ounces (148 mL) of wine, or 1.5 ounces (44 mL) of liquor.  Avoid use of street drugs. Do not share needles with anyone. Ask for help if you need support or instructions about stopping the use of drugs.  High blood pressure causes heart disease and increases the risk of  stroke. Your blood pressure should be checked at least every 1 to 2 years. Ongoing high blood pressure should be treated with medicines if weight loss and exercise do not work.  If you are 75-52 years old, ask your health care provider if you should take aspirin to prevent strokes.  Diabetes screening involves taking a blood sample to check your fasting blood sugar level. This should be done once every 3 years, after age 15, if you are within normal weight and without risk factors for diabetes. Testing should be considered at a younger age or be carried out more frequently if you are overweight and have at least 1 risk factor for diabetes.  Breast cancer screening is essential preventive care for women. You should practice "breast self-awareness." This means understanding the normal appearance and feel of your breasts and may include breast self-examination. Any changes detected, no matter how small, should be reported to a health care provider. Women in their 58s and 30s should have a clinical breast exam (CBE) by a health care provider as part of a regular health exam every 1 to 3 years. After age 16, women should have a CBE every year. Starting at age 53, women should consider having a mammogram (breast X-ray test) every year. Women who have a family history of breast cancer should talk to their health care provider about genetic screening. Women at a high risk of breast cancer should talk to their health care providers about having an MRI and a mammogram every year.  Breast cancer gene (BRCA)-related cancer risk assessment is recommended for women who have family members with BRCA-related cancers. BRCA-related cancers include breast, ovarian, tubal, and peritoneal cancers. Having family members with these cancers may be associated with an increased risk for harmful changes (mutations) in the breast cancer genes BRCA1 and BRCA2. Results of the assessment will determine the need for genetic counseling and  BRCA1 and BRCA2 testing.  Routine pelvic exams to screen for cancer are no longer recommended for nonpregnant women who are considered low risk for cancer of the pelvic organs (ovaries, uterus, and vagina) and who do not have symptoms. Ask your health care provider if a screening pelvic exam is right for you.  If you have had past treatment for cervical cancer or a condition that could lead to cancer, you need Pap tests and screening for cancer for at least 20 years after your treatment. If Pap tests have been discontinued, your risk factors (such as having a new sexual partner) need to be reassessed to determine if screening should be resumed. Some women have medical problems that increase the chance of getting cervical cancer. In these cases, your health care provider may recommend more frequent screening and Pap tests.  The HPV test is an additional test that may be used for cervical cancer screening. The HPV test looks for the virus that can cause the cell changes on the cervix. The cells collected during the Pap test can be  tested for HPV. The HPV test could be used to screen women aged 30 years and older, and should be used in women of any age who have unclear Pap test results. After the age of 30, women should have HPV testing at the same frequency as a Pap test.  Colorectal cancer can be detected and often prevented. Most routine colorectal cancer screening begins at the age of 50 years and continues through age 75 years. However, your health care provider may recommend screening at an earlier age if you have risk factors for colon cancer. On a yearly basis, your health care provider may provide home test kits to check for hidden blood in the stool. Use of a small camera at the end of a tube, to directly examine the colon (sigmoidoscopy or colonoscopy), can detect the earliest forms of colorectal cancer. Talk to your health care provider about this at age 50, when routine screening begins. Direct  exam of the colon should be repeated every 5-10 years through age 75 years, unless early forms of pre-cancerous polyps or small growths are found.  People who are at an increased risk for hepatitis B should be screened for this virus. You are considered at high risk for hepatitis B if:  You were born in a country where hepatitis B occurs often. Talk with your health care provider about which countries are considered high risk.  Your parents were born in a high-risk country and you have not received a shot to protect against hepatitis B (hepatitis B vaccine).  You have HIV or AIDS.  You use needles to inject street drugs.  You live with, or have sex with, someone who has hepatitis B.  You get hemodialysis treatment.  You take certain medicines for conditions like cancer, organ transplantation, and autoimmune conditions.  Hepatitis C blood testing is recommended for all people born from 1945 through 1965 and any individual with known risks for hepatitis C.  Practice safe sex. Use condoms and avoid high-risk sexual practices to reduce the spread of sexually transmitted infections (STIs). STIs include gonorrhea, chlamydia, syphilis, trichomonas, herpes, HPV, and human immunodeficiency virus (HIV). Herpes, HIV, and HPV are viral illnesses that have no cure. They can result in disability, cancer, and death.  You should be screened for sexually transmitted illnesses (STIs) including gonorrhea and chlamydia if:  You are sexually active and are younger than 24 years.  You are older than 24 years and your health care provider tells you that you are at risk for this type of infection.  Your sexual activity has changed since you were last screened and you are at an increased risk for chlamydia or gonorrhea. Ask your health care provider if you are at risk.  If you are at risk of being infected with HIV, it is recommended that you take a prescription medicine daily to prevent HIV infection. This is  called preexposure prophylaxis (PrEP). You are considered at risk if:  You are a heterosexual woman, are sexually active, and are at increased risk for HIV infection.  You take drugs by injection.  You are sexually active with a partner who has HIV.  Talk with your health care provider about whether you are at high risk of being infected with HIV. If you choose to begin PrEP, you should first be tested for HIV. You should then be tested every 3 months for as long as you are taking PrEP.  Osteoporosis is a disease in which the bones lose minerals and strength   with aging. This can result in serious bone fractures or breaks. The risk of osteoporosis can be identified using a bone density scan. Women ages 65 years and over and women at risk for fractures or osteoporosis should discuss screening with their health care providers. Ask your health care provider whether you should take a calcium supplement or vitamin D to reduce the rate of osteoporosis.  Menopause can be associated with physical symptoms and risks. Hormone replacement therapy is available to decrease symptoms and risks. You should talk to your health care provider about whether hormone replacement therapy is right for you.  Use sunscreen. Apply sunscreen liberally and repeatedly throughout the day. You should seek shade when your shadow is shorter than you. Protect yourself by wearing long sleeves, pants, a wide-brimmed hat, and sunglasses year round, whenever you are outdoors.  Once a month, do a whole body skin exam, using a mirror to look at the skin on your back. Tell your health care provider of new moles, moles that have irregular borders, moles that are larger than a pencil eraser, or moles that have changed in shape or color.  Stay current with required vaccines (immunizations).  Influenza vaccine. All adults should be immunized every year.  Tetanus, diphtheria, and acellular pertussis (Td, Tdap) vaccine. Pregnant women should  receive 1 dose of Tdap vaccine during each pregnancy. The dose should be obtained regardless of the length of time since the last dose. Immunization is preferred during the 27th-36th week of gestation. An adult who has not previously received Tdap or who does not know her vaccine status should receive 1 dose of Tdap. This initial dose should be followed by tetanus and diphtheria toxoids (Td) booster doses every 10 years. Adults with an unknown or incomplete history of completing a 3-dose immunization series with Td-containing vaccines should begin or complete a primary immunization series including a Tdap dose. Adults should receive a Td booster every 10 years.  Varicella vaccine. An adult without evidence of immunity to varicella should receive 2 doses or a second dose if she has previously received 1 dose. Pregnant females who do not have evidence of immunity should receive the first dose after pregnancy. This first dose should be obtained before leaving the health care facility. The second dose should be obtained 4-8 weeks after the first dose.  Human papillomavirus (HPV) vaccine. Females aged 13-26 years who have not received the vaccine previously should obtain the 3-dose series. The vaccine is not recommended for use in pregnant females. However, pregnancy testing is not needed before receiving a dose. If a female is found to be pregnant after receiving a dose, no treatment is needed. In that case, the remaining doses should be delayed until after the pregnancy. Immunization is recommended for any person with an immunocompromised condition through the age of 26 years if she did not get any or all doses earlier. During the 3-dose series, the second dose should be obtained 4-8 weeks after the first dose. The third dose should be obtained 24 weeks after the first dose and 16 weeks after the second dose.  Zoster vaccine. One dose is recommended for adults aged 60 years or older unless certain conditions are  present.  Measles, mumps, and rubella (MMR) vaccine. Adults born before 1957 generally are considered immune to measles and mumps. Adults born in 1957 or later should have 1 or more doses of MMR vaccine unless there is a contraindication to the vaccine or there is laboratory evidence of immunity to   each of the three diseases. A routine second dose of MMR vaccine should be obtained at least 28 days after the first dose for students attending postsecondary schools, health care workers, or international travelers. People who received inactivated measles vaccine or an unknown type of measles vaccine during 1963-1967 should receive 2 doses of MMR vaccine. People who received inactivated mumps vaccine or an unknown type of mumps vaccine before 1979 and are at high risk for mumps infection should consider immunization with 2 doses of MMR vaccine. For females of childbearing age, rubella immunity should be determined. If there is no evidence of immunity, females who are not pregnant should be vaccinated. If there is no evidence of immunity, females who are pregnant should delay immunization until after pregnancy. Unvaccinated health care workers born before 1957 who lack laboratory evidence of measles, mumps, or rubella immunity or laboratory confirmation of disease should consider measles and mumps immunization with 2 doses of MMR vaccine or rubella immunization with 1 dose of MMR vaccine.  Pneumococcal 13-valent conjugate (PCV13) vaccine. When indicated, a person who is uncertain of her immunization history and has no record of immunization should receive the PCV13 vaccine. An adult aged 19 years or older who has certain medical conditions and has not been previously immunized should receive 1 dose of PCV13 vaccine. This PCV13 should be followed with a dose of pneumococcal polysaccharide (PPSV23) vaccine. The PPSV23 vaccine dose should be obtained at least 8 weeks after the dose of PCV13 vaccine. An adult aged 19  years or older who has certain medical conditions and previously received 1 or more doses of PPSV23 vaccine should receive 1 dose of PCV13. The PCV13 vaccine dose should be obtained 1 or more years after the last PPSV23 vaccine dose.  Pneumococcal polysaccharide (PPSV23) vaccine. When PCV13 is also indicated, PCV13 should be obtained first. All adults aged 65 years and older should be immunized. An adult younger than age 65 years who has certain medical conditions should be immunized. Any person who resides in a nursing home or long-term care facility should be immunized. An adult smoker should be immunized. People with an immunocompromised condition and certain other conditions should receive both PCV13 and PPSV23 vaccines. People with human immunodeficiency virus (HIV) infection should be immunized as soon as possible after diagnosis. Immunization during chemotherapy or radiation therapy should be avoided. Routine use of PPSV23 vaccine is not recommended for American Indians, [place] Natives, or people younger than 65 years unless there are medical conditions that require PPSV23 vaccine. When indicated, people who have unknown immunization and have no record of immunization should receive PPSV23 vaccine. One-time revaccination 5 years after the first dose of PPSV23 is recommended for people aged 19-64 years who have chronic kidney failure, nephrotic syndrome, asplenia, or immunocompromised conditions. People who received 1-2 doses of PPSV23 before age 65 years should receive another dose of PPSV23 vaccine at age 65 years or later if at least 5 years have passed since the previous dose. Doses of PPSV23 are not needed for people immunized with PPSV23 at or after age 65 years.  Meningococcal vaccine. Adults with asplenia or persistent complement component deficiencies should receive 2 doses of quadrivalent meningococcal conjugate (MenACWY-D) vaccine. The doses should be obtained at least 2 months apart.  Microbiologists working with certain meningococcal bacteria, military recruits, people at risk during an outbreak, and people who travel to or live in countries with a high rate of meningitis should be immunized. A first-year college student up through age   21 years who is living in a residence hall should receive a dose if she did not receive a dose on or after her 16th birthday. Adults who have certain high-risk conditions should receive one or more doses of vaccine.  Hepatitis A vaccine. Adults who wish to be protected from this disease, have certain high-risk conditions, work with hepatitis A-infected animals, work in hepatitis A research labs, or travel to or work in countries with a high rate of hepatitis A should be immunized. Adults who were previously unvaccinated and who anticipate close contact with an international adoptee during the first 60 days after arrival in the [place] from a country with a high rate of hepatitis A should be immunized.  Hepatitis B vaccine. Adults who wish to be protected from this disease, have certain high-risk conditions, may be exposed to blood or other infectious body fluids, are household contacts or sex partners of hepatitis B positive people, are clients or workers in certain care facilities, or travel to or work in countries with a high rate of hepatitis B should be immunized.  Haemophilus influenzae type b (Hib) vaccine. A previously unvaccinated person with asplenia or sickle cell disease or having a scheduled splenectomy should receive 1 dose of Hib vaccine. Regardless of previous immunization, a recipient of a hematopoietic stem cell transplant should receive a 3-dose series 6-12 months after her successful transplant. Hib vaccine is not recommended for adults with HIV infection. Preventive Services / Frequency Ages 64 to 68 years  Blood pressure check.** / Every 1 to 2 years.  Lipid and cholesterol check.** / Every 5 years beginning at age  22.  Clinical breast exam.** / Every 3 years for women in their 88s and 53s.  BRCA-related cancer risk assessment.** / For women who have family members with a BRCA-related cancer (breast, ovarian, tubal, or peritoneal cancers).  Pap test.** / Every 2 years from ages 90 through 51. Every 3 years starting at age 21 through age 56 or 3 with a history of 3 consecutive normal Pap tests.  HPV screening.** / Every 3 years from ages 24 through ages 1 to 46 with a history of 3 consecutive normal Pap tests.  Hepatitis C blood test.** / For any individual with known risks for hepatitis C.  Skin self-exam. / Monthly.  Influenza vaccine. / Every year.  Tetanus, diphtheria, and acellular pertussis (Tdap, Td) vaccine.** / Consult your health care provider. Pregnant women should receive 1 dose of Tdap vaccine during each pregnancy. 1 dose of Td every 10 years.  Varicella vaccine.** / Consult your health care provider. Pregnant females who do not have evidence of immunity should receive the first dose after pregnancy.  HPV vaccine. / 3 doses over 6 months, if 72 and younger. The vaccine is not recommended for use in pregnant females. However, pregnancy testing is not needed before receiving a dose.  Measles, mumps, rubella (MMR) vaccine.** / You need at least 1 dose of MMR if you were born in 1957 or later. You may also need a 2nd dose. For females of childbearing age, rubella immunity should be determined. If there is no evidence of immunity, females who are not pregnant should be vaccinated. If there is no evidence of immunity, females who are pregnant should delay immunization until after pregnancy.  Pneumococcal 13-valent conjugate (PCV13) vaccine.** / Consult your health care provider.  Pneumococcal polysaccharide (PPSV23) vaccine.** / 1 to 2 doses if you smoke cigarettes or if you have certain conditions.  Meningococcal vaccine.** /  1 dose if you are age 19 to 21 years and a first-year college  student living in a residence hall, or have one of several medical conditions, you need to get vaccinated against meningococcal disease. You may also need additional booster doses.  Hepatitis A vaccine.** / Consult your health care provider.  Hepatitis B vaccine.** / Consult your health care provider.  Haemophilus influenzae type b (Hib) vaccine.** / Consult your health care provider. Ages 40 to 64 years  Blood pressure check.** / Every 1 to 2 years.  Lipid and cholesterol check.** / Every 5 years beginning at age 20 years.  Lung cancer screening. / Every year if you are aged 55-80 years and have a 30-pack-year history of smoking and currently smoke or have quit within the past 15 years. Yearly screening is stopped once you have quit smoking for at least 15 years or develop a health problem that would prevent you from having lung cancer treatment.  Clinical breast exam.** / Every year after age 40 years.  BRCA-related cancer risk assessment.** / For women who have family members with a BRCA-related cancer (breast, ovarian, tubal, or peritoneal cancers).  Mammogram.** / Every year beginning at age 40 years and continuing for as long as you are in good health. Consult with your health care provider.  Pap test.** / Every 3 years starting at age 30 years through age 65 or 70 years with a history of 3 consecutive normal Pap tests.  HPV screening.** / Every 3 years from ages 30 years through ages 65 to 70 years with a history of 3 consecutive normal Pap tests.  Fecal occult blood test (FOBT) of stool. / Every year beginning at age 50 years and continuing until age 75 years. You may not need to do this test if you get a colonoscopy every 10 years.  Flexible sigmoidoscopy or colonoscopy.** / Every 5 years for a flexible sigmoidoscopy or every 10 years for a colonoscopy beginning at age 50 years and continuing until age 75 years.  Hepatitis C blood test.** / For all people born from 1945 through  1965 and any individual with known risks for hepatitis C.  Skin self-exam. / Monthly.  Influenza vaccine. / Every year.  Tetanus, diphtheria, and acellular pertussis (Tdap/Td) vaccine.** / Consult your health care provider. Pregnant women should receive 1 dose of Tdap vaccine during each pregnancy. 1 dose of Td every 10 years.  Varicella vaccine.** / Consult your health care provider. Pregnant females who do not have evidence of immunity should receive the first dose after pregnancy.  Zoster vaccine.** / 1 dose for adults aged 60 years or older.  Measles, mumps, rubella (MMR) vaccine.** / You need at least 1 dose of MMR if you were born in 1957 or later. You may also need a 2nd dose. For females of childbearing age, rubella immunity should be determined. If there is no evidence of immunity, females who are not pregnant should be vaccinated. If there is no evidence of immunity, females who are pregnant should delay immunization until after pregnancy.  Pneumococcal 13-valent conjugate (PCV13) vaccine.** / Consult your health care provider.  Pneumococcal polysaccharide (PPSV23) vaccine.** / 1 to 2 doses if you smoke cigarettes or if you have certain conditions.  Meningococcal vaccine.** / Consult your health care provider.  Hepatitis A vaccine.** / Consult your health care provider.  Hepatitis B vaccine.** / Consult your health care provider.  Haemophilus influenzae type b (Hib) vaccine.** / Consult your health care provider. Ages 65   years and over  Blood pressure check.** / Every 1 to 2 years.  Lipid and cholesterol check.** / Every 5 years beginning at age 22 years.  Lung cancer screening. / Every year if you are aged 73-80 years and have a 30-pack-year history of smoking and currently smoke or have quit within the past 15 years. Yearly screening is stopped once you have quit smoking for at least 15 years or develop a health problem that would prevent you from having lung cancer  treatment.  Clinical breast exam.** / Every year after age 4 years.  BRCA-related cancer risk assessment.** / For women who have family members with a BRCA-related cancer (breast, ovarian, tubal, or peritoneal cancers).  Mammogram.** / Every year beginning at age 40 years and continuing for as long as you are in good health. Consult with your health care provider.  Pap test.** / Every 3 years starting at age 9 years through age 34 or 91 years with 3 consecutive normal Pap tests. Testing can be stopped between 65 and 70 years with 3 consecutive normal Pap tests and no abnormal Pap or HPV tests in the past 10 years.  HPV screening.** / Every 3 years from ages 57 years through ages 64 or 45 years with a history of 3 consecutive normal Pap tests. Testing can be stopped between 65 and 70 years with 3 consecutive normal Pap tests and no abnormal Pap or HPV tests in the past 10 years.  Fecal occult blood test (FOBT) of stool. / Every year beginning at age 15 years and continuing until age 17 years. You may not need to do this test if you get a colonoscopy every 10 years.  Flexible sigmoidoscopy or colonoscopy.** / Every 5 years for a flexible sigmoidoscopy or every 10 years for a colonoscopy beginning at age 86 years and continuing until age 71 years.  Hepatitis C blood test.** / For all people born from [birth year] through 1964 and any individual with known risks for hepatitis C.  Osteoporosis screening.** / A one-time screening for women ages 83 years and over and women at risk for fractures or osteoporosis.  Skin self-exam. / Monthly.  Influenza vaccine. / Every year.  Tetanus, diphtheria, and acellular pertussis (Tdap/Td) vaccine.** / 1 dose of Td every 10 years.  Varicella vaccine.** / Consult your health care provider.  Zoster vaccine.** / 1 dose for adults aged 61 years or older.  Pneumococcal 13-valent conjugate (PCV13) vaccine.** / Consult your health care provider.  Pneumococcal  polysaccharide (PPSV23) vaccine.** / 1 dose for all adults aged 28 years and older.  Meningococcal vaccine.** / Consult your health care provider.  Hepatitis A vaccine.** / Consult your health care provider.  Hepatitis B vaccine.** / Consult your health care provider.  Haemophilus influenzae type b (Hib) vaccine.** / Consult your health care provider. ** Family history and personal history of risk and conditions may change your health care provider's recommendations. Document Released: 05/20/2001 Document Revised: 08/08/2013 Document Reviewed: 08/19/2010 [facility] Patient Information 2015 [place], [place]. This information is not intended to replace advice given to you by your health care provider. Make sure you discuss any questions you have with your health care provider.

## 2013-12-05 NOTE — Progress Notes (Signed)
Pre visit review using our clinic review tool, if applicable. No additional management support is needed unless otherwise documented below in the visit note. 

## 2013-12-05 NOTE — Progress Notes (Signed)
Subjective:     Marissa Crawford is a 33 y.o. female and is here for a comprehensive physical exam. The patient reports no problems.  History   Social History  . Marital Status: Single    Spouse Name: N/A    Number of Children: N/A  . Years of Education: N/A   Occupational History  . 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 Smoking status: Current Every Day Smoker -- 1.00 packs/day for 11 years    Types: Cigarettes  . Smokeless tobacco: Never Used  . Alcohol Use: 1.0 oz/week    2 drink(s) per week  . Drug Use: No  . Sexual Activity: Yes    Partners: Male    Birth Control/ Protection: Condom, None   Other Topics Concern  . Not on file   Social History Narrative   Exercise ---cardio   Health Maintenance  Topic Date Due  . Pap Smear  12/01/2013  . Influenza Vaccine  12/06/2014 (Originally 11/05/2013)  . Tetanus/tdap  04/08/2015    The following portions of the patient's history were reviewed and updated as appropriate:  She  has no past medical history on file. She  does not have a problem list on file. She  has no past surgical history on file. Her family history includes Alcohol abuse in her father; Cancer (age of onset: 45) in her mother; Cervical cancer in her mother; Diabetes in her brother, maternal grandfather, maternal grandmother, paternal grandfather, and paternal grandmother; Gout in her father; Hypertension in her father; Seizures in her sister; Stroke in her maternal grandfather and paternal grandmother. She  reports that she has been smoking Cigarettes.  She has a 11 pack-year smoking history. She has never used smokeless tobacco. She reports that she drinks about one ounce of alcohol per week. She reports that she does not use illicit drugs. She has a current medication list which includes the following prescription(s): benzoyl peroxide-erythromycin. Current Outpatient Prescriptions on File Prior to Visit  Medication Sig Dispense Refill  .  benzoyl peroxide-erythromycin (BENZAMYCIN) gel APPLY TOPICALLY 2 TIMES DAILY.  46.6 g  2   No current facility-administered medications on file prior to visit.   She has No Known Allergies..  Review of Systems Review of Systems  Constitutional: Negative for activity change, appetite change and fatigue.  HENT: Negative for hearing loss, congestion, tinnitus and ear discharge.  dentist q33m Eyes: Negative for visual disturbance (see optho q1y -- vision corrected to 20/20 with glasses).  Respiratory: Negative for cough, chest tightness and shortness of breath.   Cardiovascular: Negative for chest pain, palpitations and leg swelling.  Gastrointestinal: Negative for abdominal pain, diarrhea, constipation and abdominal distention.  Genitourinary: Negative for urgency, frequency, decreased urine volume and difficulty urinating.  Musculoskeletal: Negative for back pain, arthralgias and gait problem.  Skin: Negative for color change, pallor and rash.  Neurological: Negative for dizziness, light-headedness, numbness and headaches.  Hematological: Negative for adenopathy. Does not bruise/bleed easily.  Psychiatric/Behavioral: Negative for suicidal ideas, confusion, sleep disturbance, self-injury, dysphoric mood, decreased concentration and agitation.       Objective:    BP 120/80  Pulse 94  Temp(Src) 98.6 F (37 C) (Oral)  Ht 5\' 3"  (1.6 m)  Wt 158 lb 1.1 oz (71.7 kg)  BMI 28.01 kg/m2  SpO2 98%  LMP 11/20/2013 General appearance: alert, cooperative, appears stated age and no distress Head: Normocephalic, without obvious abnormality, atraumatic Eyes: conjunctivae/corneas clear. PERRL, EOM's intact. Fundi benign. Ears: normal  TM's and external ear canals both ears Nose: Nares normal. Septum midline. Mucosa normal. No drainage or sinus tenderness. Throat: lips, mucosa, and tongue normal; teeth and gums normal Neck: no adenopathy, no carotid bruit, no JVD, supple, symmetrical, trachea  midline and thyroid not enlarged, symmetric, no tenderness/mass/nodules Back: symmetric, no curvature. ROM normal. No CVA tenderness. Lungs: clear to auscultation bilaterally Breasts: normal appearance, no masses or tenderness Heart: regular rate and rhythm, S1, S2 normal, no murmur, click, rub or gallop Abdomen: soft, non-tender; bowel sounds normal; no masses,  no organomegaly Pelvic: cervix normal in appearance, external genitalia normal, no adnexal masses or tenderness, no cervical motion tenderness, rectovaginal septum normal, uterus normal size, shape, and consistency and vagina normal without discharge--pap done Extremities: extremities normal, atraumatic, no cyanosis or edema Pulses: 2+ and symmetric Skin: Skin color, texture, turgor normal. No rashes or lesions Lymph nodes: Cervical, supraclavicular, and axillary nodes normal. Neurologic: Alert and oriented X 3, normal strength and tone. Normal symmetric reflexes. Normal coordination and gait Psych- no depression, no anxiety      Assessment:    Healthy female exam.      Plan:    ghm utd Check labs See After Visit Summary for Counseling Recommendations

## 2013-12-06 LAB — HSV(HERPES SIMPLEX VRS) I + II AB-IGG
HSV 1 Glycoprotein G Ab, IgG: 11.91 IV — ABNORMAL HIGH
HSV 2 GLYCOPROTEIN G AB, IGG: 5.36 IV — AB

## 2013-12-06 LAB — HIV ANTIBODY (ROUTINE TESTING W REFLEX): HIV: NONREACTIVE

## 2013-12-06 LAB — CYTOLOGY - PAP

## 2013-12-06 LAB — RPR

## 2014-06-02 ENCOUNTER — Ambulatory Visit: Payer: 59 | Admitting: Physician Assistant

## 2014-06-08 ENCOUNTER — Telehealth: Payer: Self-pay | Admitting: Family Medicine

## 2014-06-08 NOTE — Telephone Encounter (Signed)
Charge. 

## 2014-06-08 NOTE — Telephone Encounter (Signed)
Charge request sent

## 2014-06-08 NOTE — Telephone Encounter (Signed)
PT was no show for appt on 02/26-  I spoke with the PT today and stated she fell asleep and forgot about the appointment. Do you want to charge a no show? PT states she made appt with OBGYN for issue.

## 2014-11-17 ENCOUNTER — Other Ambulatory Visit: Payer: Self-pay | Admitting: Obstetrics & Gynecology

## 2014-11-20 LAB — CYTOLOGY - PAP

## 2014-11-27 ENCOUNTER — Telehealth: Payer: Self-pay | Admitting: Family Medicine

## 2014-11-27 NOTE — Telephone Encounter (Signed)
Pre visit letter mailed 11/27/14

## 2014-12-15 ENCOUNTER — Other Ambulatory Visit: Payer: Self-pay

## 2014-12-15 ENCOUNTER — Telehealth: Payer: Self-pay

## 2014-12-15 ENCOUNTER — Encounter: Payer: Self-pay | Admitting: Family Medicine

## 2014-12-15 NOTE — Telephone Encounter (Signed)
Pre visit call completed 

## 2014-12-18 ENCOUNTER — Encounter: Payer: Self-pay | Admitting: Family Medicine

## 2014-12-18 ENCOUNTER — Ambulatory Visit (INDEPENDENT_AMBULATORY_CARE_PROVIDER_SITE_OTHER): Payer: 59 | Admitting: Family Medicine

## 2014-12-18 VITALS — BP 120/80 | HR 89 | Ht 63.0 in | Wt 156.0 lb

## 2014-12-18 DIAGNOSIS — Z0000 Encounter for general adult medical examination without abnormal findings: Secondary | ICD-10-CM

## 2014-12-18 DIAGNOSIS — F430 Acute stress reaction: Secondary | ICD-10-CM | POA: Diagnosis not present

## 2014-12-18 DIAGNOSIS — Z720 Tobacco use: Secondary | ICD-10-CM | POA: Diagnosis not present

## 2014-12-18 DIAGNOSIS — F17200 Nicotine dependence, unspecified, uncomplicated: Secondary | ICD-10-CM

## 2014-12-18 LAB — COMPREHENSIVE METABOLIC PANEL
ALT: 15 U/L (ref 0–35)
AST: 26 U/L (ref 0–37)
Albumin: 4.2 g/dL (ref 3.5–5.2)
Alkaline Phosphatase: 46 U/L (ref 39–117)
BILIRUBIN TOTAL: 0.8 mg/dL (ref 0.2–1.2)
BUN: 13 mg/dL (ref 6–23)
CALCIUM: 9.2 mg/dL (ref 8.4–10.5)
CHLORIDE: 105 meq/L (ref 96–112)
CO2: 25 meq/L (ref 19–32)
Creatinine, Ser: 0.79 mg/dL (ref 0.40–1.20)
GFR: 107.67 mL/min (ref >60.00)
Glucose, Bld: 72 mg/dL (ref 70–99)
Potassium: 3.8 mEq/L (ref 3.5–5.1)
Sodium: 138 mEq/L (ref 135–145)
Total Protein: 7.2 g/dL (ref 6.0–8.3)

## 2014-12-18 LAB — POCT URINALYSIS DIPSTICK
BILIRUBIN UA: NEGATIVE
GLUCOSE UA: NEGATIVE
Leukocytes, UA: NEGATIVE
NITRITE UA: NEGATIVE
Protein, UA: NEGATIVE
RBC UA: NEGATIVE
Spec Grav, UA: 1.025
Urobilinogen, UA: 0.2
pH, UA: 8.5

## 2014-12-18 LAB — CBC WITH DIFFERENTIAL/PLATELET
BASOS PCT: 0.4 % (ref 0.0–3.0)
Basophils Absolute: 0 10*3/uL (ref 0.0–0.1)
Eosinophils Absolute: 0.2 10*3/uL (ref 0.0–0.7)
Eosinophils Relative: 2.1 % (ref 0.0–5.0)
HEMATOCRIT: 37 % (ref 36.0–46.0)
Hemoglobin: 12.5 g/dL (ref 12.0–15.0)
LYMPHS PCT: 36.5 % (ref 12.0–46.0)
Lymphs Abs: 2.9 10*3/uL (ref 0.7–4.0)
MCHC: 33.8 g/dL (ref 30.0–36.0)
MCV: 98.3 fl (ref 78.0–100.0)
MONOS PCT: 8.5 % (ref 3.0–12.0)
Monocytes Absolute: 0.7 10*3/uL (ref 0.1–1.0)
NEUTROS ABS: 4.2 10*3/uL (ref 1.4–7.7)
Neutrophils Relative %: 52.5 % (ref 43.0–77.0)
PLATELETS: 255 10*3/uL (ref 150.0–400.0)
RBC: 3.77 Mil/uL — ABNORMAL LOW (ref 3.87–5.11)
RDW: 13.7 % (ref 11.5–15.5)
WBC: 7.9 10*3/uL (ref 4.0–10.5)

## 2014-12-18 LAB — LIPID PANEL
CHOL/HDL RATIO: 3
Cholesterol: 128 mg/dL (ref 0–200)
HDL: 51.1 mg/dL (ref >39.00)
LDL CALC: 67 mg/dL (ref 0–99)
NonHDL: 77.09
TRIGLYCERIDES: 49 mg/dL (ref 0.0–149.0)
VLDL: 9.8 mg/dL (ref 0.0–40.0)

## 2014-12-18 LAB — TSH: TSH: 2.25 u[IU]/mL (ref 0.35–4.50)

## 2014-12-18 MED ORDER — BUPROPION HCL ER (XL) 300 MG PO TB24: 300.0000 mg | ORAL_TABLET | Freq: Every day | ORAL | Status: DC

## 2014-12-18 MED ORDER — BUPROPION HCL ER (XL) 150 MG PO TB24: ORAL_TABLET | ORAL | Status: DC

## 2014-12-18 NOTE — Progress Notes (Signed)
Pre visit review using our clinic review tool, if applicable. No additional management support is needed unless otherwise documented below in the visit note. 

## 2014-12-18 NOTE — Progress Notes (Signed)
Subjective:     Marissa Crawford is a 34 y.o. female and is here for a comprehensive physical exam. The patient reports problems - inc stress at work and she really wants to quit smoking..  Social History   Social History  . Marital Status: Single    Spouse Name: N/A  . Number of Children: N/A  . Years of Education: N/A   Occupational History  . 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 Smoking status: Current Every Day Smoker -- 1.00 packs/day for 11 years    Types: Cigarettes  . Smokeless tobacco: Never Used  . Alcohol Use: 1.0 oz/week    2 drink(s) per week  . Drug Use: No  . Sexual Activity:    Partners: Male    Birth Control/ Protection: Condom, None   Other Topics Concern  . Not on file   Social History Narrative   Exercise ---cardio   Health Maintenance  Topic Date Due  . INFLUENZA VACCINE  12/18/2015 (Originally 11/06/2014)  . TETANUS/TDAP  04/08/2015  . PAP SMEAR  11/17/2015  . HIV Screening  Completed    The following portions of the patient's history were reviewed and updated as appropriate:  She  has no past medical history on file. She  does not have a problem list on file. She  has no past surgical history on file. Her family history includes Alcohol abuse in her father; Cancer (age of onset: 36) in her mother; Cervical cancer in her mother; Diabetes in her brother, maternal grandfather, maternal grandmother, paternal grandfather, and paternal grandmother; Gout in her father; Hypertension in her father; Seizures in her sister; Stroke in her maternal grandfather and paternal grandmother. She  reports that she has been smoking Cigarettes.  She has a 11 pack-year smoking history. She has never used smokeless tobacco. She reports that she drinks about 1.0 oz of alcohol per week. She reports that she does not use illicit drugs. She has a current medication list which includes the following prescription(s): norethindrone acet-ethinyl est, bupropion,  and bupropion. No current outpatient prescriptions on file prior to visit.   No current facility-administered medications on file prior to visit.   She has No Known Allergies..  Review of Systems  Review of Systems  Constitutional: Negative for activity change, appetite change and fatigue.  HENT: Negative for hearing loss, congestion, tinnitus and ear discharge.  dentist q6m Eyes: Negative for visual disturbance (see optho q1y -- vision corrected to 20/20 with glasses).  Respiratory: Negative for cough, chest tightness and shortness of breath.   Cardiovascular: Negative for chest pain, palpitations and leg swelling.  Gastrointestinal: Negative for abdominal pain, diarrhea, constipation and abdominal distention.  Genitourinary: Negative for urgency, frequency, decreased urine volume and difficulty urinating.  Musculoskeletal: Negative for back pain, arthralgias and gait problem.  Skin: Negative for color change, pallor and rash.  Neurological: Negative for dizziness, light-headedness, numbness and headaches.  Hematological: Negative for adenopathy. Does not bruise/bleed easily.  Psychiatric/Behavioral: Negative for suicidal ideas, confusion, sleep disturbance, self-injury, dysphoric mood, decreased concentration and agitation.      Objective:    BP 120/80 mmHg  Pulse 89  Ht 5' 3" (1.6 m)  Wt 156 lb (70.761 kg)  BMI 27.64 kg/m2  SpO2 99% General appearance: alert, cooperative, appears stated age and no distress Head: Normocephalic, without obvious abnormality, atraumatic Eyes: conjunctivae/corneas clear. PERRL, EOM's intact. Fundi benign. Ears: normal TM's and external ear canals both ears Nose: Nares normal.   Septum midline. Mucosa normal. No drainage or sinus tenderness. Throat: lips, mucosa, and tongue normal; teeth and gums normal Neck: no adenopathy, no carotid bruit, no JVD, supple, symmetrical, trachea midline and thyroid not enlarged, symmetric, no  tenderness/mass/nodules Back: symmetric, no curvature. ROM normal. No CVA tenderness. Lungs: clear to auscultation bilaterally Breasts: deferred--gyn Heart: regular rate and rhythm, S1, S2 normal, no murmur, click, rub or gallop Abdomen: soft, non-tender; bowel sounds normal; no masses,  no organomegaly Pelvic: deferred--gyn Extremities: extremities normal, atraumatic, no cyanosis or edema Pulses: 2+ and symmetric Skin: Skin color, texture, turgor normal. No rashes or lesions Lymph nodes: Cervical, supraclavicular, and axillary nodes normal. Neurologic: Alert and oriented X 3, normal strength and tone. Normal symmetric reflexes. Normal coordination and gait Psych- no depression, no anxiety---- under a lot of stress and would like something to help with mood, smoking and weight      Assessment:    Healthy female exam.      Plan:    ghm utd Check labs  See After Visit Summary for Counseling Recommendations    1. Preventative health care ghm utd  Check labs - CBC with Differential/Platelet - Comp Met (CMET) - Lipid panel - POCT urinalysis dipstick - TSH  2. Stress reaction Start meds---rto prn - buPROPion (WELLBUTRIN XL) 150 MG 24 hr tablet; 1 po qam x 1 week then increase to 2 po qd  Dispense: 60 tablet; Refill: 0 - buPROPion (WELLBUTRIN XL) 300 MG 24 hr tablet; Take 1 tablet (300 mg total) by mouth daily.  Dispense: 30 tablet; Refill: 2  3. Tobacco use disorder   - buPROPion (WELLBUTRIN XL) 150 MG 24 hr tablet; 1 po qam x 1 week then increase to 2 po qd  Dispense: 60 tablet; Refill: 0 - buPROPion (WELLBUTRIN XL) 300 MG 24 hr tablet; Take 1 tablet (300 mg total) by mouth daily.  Dispense: 30 tablet; Refill: 2 

## 2014-12-18 NOTE — Patient Instructions (Signed)
Preventive Care for Adults A healthy lifestyle and preventive care can promote health and wellness. Preventive health guidelines for women include the following key practices.  A routine yearly physical is a good way to check with your health care provider about your health and preventive screening. It is a chance to share any concerns and updates on your health and to receive a thorough exam.  Visit your dentist for a routine exam and preventive care every 6 months. Brush your teeth twice a day and floss once a day. Good oral hygiene prevents tooth decay and gum disease.  The frequency of eye exams is based on your age, health, family medical history, use of contact lenses, and other factors. Follow your health care provider's recommendations for frequency of eye exams.  Eat a healthy diet. Foods like vegetables, fruits, whole grains, low-fat dairy products, and lean protein foods contain the nutrients you need without too many calories. Decrease your intake of foods high in solid fats, added sugars, and salt. Eat the right amount of calories for you.Get information about a proper diet from your health care provider, if necessary.  Regular physical exercise is one of the most important things you can do for your health. Most adults should get at least 150 minutes of moderate-intensity exercise (any activity that increases your heart rate and causes you to sweat) each week. In addition, most adults need muscle-strengthening exercises on 2 or more days a week.  Maintain a healthy weight. The body mass index (BMI) is a screening tool to identify possible weight problems. It provides an estimate of body fat based on height and weight. Your health care provider can find your BMI and can help you achieve or maintain a healthy weight.For adults 20 years and older:  A BMI below 18.5 is considered underweight.  A BMI of 18.5 to 24.9 is normal.  A BMI of 25 to 29.9 is considered overweight.  A BMI of  30 and above is considered obese.  Maintain normal blood lipids and cholesterol levels by exercising and minimizing your intake of saturated fat. Eat a balanced diet with plenty of fruit and vegetables. Blood tests for lipids and cholesterol should begin at age 81 and be repeated every 5 years. If your lipid or cholesterol levels are high, you are over 50, or you are at high risk for heart disease, you may need your cholesterol levels checked more frequently.Ongoing high lipid and cholesterol levels should be treated with medicines if diet and exercise are not working.  If you smoke, find out from your health care provider how to quit. If you do not use tobacco, do not start.  Lung cancer screening is recommended for adults aged 11-80 years who are at high risk for developing lung cancer because of a history of smoking. A yearly low-dose CT scan of the lungs is recommended for people who have at least a 30-pack-year history of smoking and are a current smoker or have quit within the past 15 years. A pack year of smoking is smoking an average of 1 pack of cigarettes a day for 1 year (for example: 1 pack a day for 30 years or 2 packs a day for 15 years). Yearly screening should continue until the smoker has stopped smoking for at least 15 years. Yearly screening should be stopped for people who develop a health problem that would prevent them from having lung cancer treatment.  If you are pregnant, do not drink alcohol. If you are breastfeeding,  be very cautious about drinking alcohol. If you are not pregnant and choose to drink alcohol, do not have more than 1 drink per day. One drink is considered to be 12 ounces (355 mL) of beer, 5 ounces (148 mL) of wine, or 1.5 ounces (44 mL) of liquor.  Avoid use of street drugs. Do not share needles with anyone. Ask for help if you need support or instructions about stopping the use of drugs.  High blood pressure causes heart disease and increases the risk of  stroke. Your blood pressure should be checked at least every 1 to 2 years. Ongoing high blood pressure should be treated with medicines if weight loss and exercise do not work.  If you are 75-52 years old, ask your health care provider if you should take aspirin to prevent strokes.  Diabetes screening involves taking a blood sample to check your fasting blood sugar level. This should be done once every 3 years, after age 15, if you are within normal weight and without risk factors for diabetes. Testing should be considered at a younger age or be carried out more frequently if you are overweight and have at least 1 risk factor for diabetes.  Breast cancer screening is essential preventive care for women. You should practice "breast self-awareness." This means understanding the normal appearance and feel of your breasts and may include breast self-examination. Any changes detected, no matter how small, should be reported to a health care provider. Women in their 58s and 30s should have a clinical breast exam (CBE) by a health care provider as part of a regular health exam every 1 to 3 years. After age 16, women should have a CBE every year. Starting at age 53, women should consider having a mammogram (breast X-ray test) every year. Women who have a family history of breast cancer should talk to their health care provider about genetic screening. Women at a high risk of breast cancer should talk to their health care providers about having an MRI and a mammogram every year.  Breast cancer gene (BRCA)-related cancer risk assessment is recommended for women who have family members with BRCA-related cancers. BRCA-related cancers include breast, ovarian, tubal, and peritoneal cancers. Having family members with these cancers may be associated with an increased risk for harmful changes (mutations) in the breast cancer genes BRCA1 and BRCA2. Results of the assessment will determine the need for genetic counseling and  BRCA1 and BRCA2 testing.  Routine pelvic exams to screen for cancer are no longer recommended for nonpregnant women who are considered low risk for cancer of the pelvic organs (ovaries, uterus, and vagina) and who do not have symptoms. Ask your health care provider if a screening pelvic exam is right for you.  If you have had past treatment for cervical cancer or a condition that could lead to cancer, you need Pap tests and screening for cancer for at least 20 years after your treatment. If Pap tests have been discontinued, your risk factors (such as having a new sexual partner) need to be reassessed to determine if screening should be resumed. Some women have medical problems that increase the chance of getting cervical cancer. In these cases, your health care provider may recommend more frequent screening and Pap tests.  The HPV test is an additional test that may be used for cervical cancer screening. The HPV test looks for the virus that can cause the cell changes on the cervix. The cells collected during the Pap test can be  tested for HPV. The HPV test could be used to screen women aged 30 years and older, and should be used in women of any age who have unclear Pap test results. After the age of 30, women should have HPV testing at the same frequency as a Pap test.  Colorectal cancer can be detected and often prevented. Most routine colorectal cancer screening begins at the age of 50 years and continues through age 75 years. However, your health care provider may recommend screening at an earlier age if you have risk factors for colon cancer. On a yearly basis, your health care provider may provide home test kits to check for hidden blood in the stool. Use of a small camera at the end of a tube, to directly examine the colon (sigmoidoscopy or colonoscopy), can detect the earliest forms of colorectal cancer. Talk to your health care provider about this at age 50, when routine screening begins. Direct  exam of the colon should be repeated every 5-10 years through age 75 years, unless early forms of pre-cancerous polyps or small growths are found.  People who are at an increased risk for hepatitis B should be screened for this virus. You are considered at high risk for hepatitis B if:  You were born in a country where hepatitis B occurs often. Talk with your health care provider about which countries are considered high risk.  Your parents were born in a high-risk country and you have not received a shot to protect against hepatitis B (hepatitis B vaccine).  You have HIV or AIDS.  You use needles to inject street drugs.  You live with, or have sex with, someone who has hepatitis B.  You get hemodialysis treatment.  You take certain medicines for conditions like cancer, organ transplantation, and autoimmune conditions.  Hepatitis C blood testing is recommended for all people born from 1945 through 1965 and any individual with known risks for hepatitis C.  Practice safe sex. Use condoms and avoid high-risk sexual practices to reduce the spread of sexually transmitted infections (STIs). STIs include gonorrhea, chlamydia, syphilis, trichomonas, herpes, HPV, and human immunodeficiency virus (HIV). Herpes, HIV, and HPV are viral illnesses that have no cure. They can result in disability, cancer, and death.  You should be screened for sexually transmitted illnesses (STIs) including gonorrhea and chlamydia if:  You are sexually active and are younger than 24 years.  You are older than 24 years and your health care provider tells you that you are at risk for this type of infection.  Your sexual activity has changed since you were last screened and you are at an increased risk for chlamydia or gonorrhea. Ask your health care provider if you are at risk.  If you are at risk of being infected with HIV, it is recommended that you take a prescription medicine daily to prevent HIV infection. This is  called preexposure prophylaxis (PrEP). You are considered at risk if:  You are a heterosexual woman, are sexually active, and are at increased risk for HIV infection.  You take drugs by injection.  You are sexually active with a partner who has HIV.  Talk with your health care provider about whether you are at high risk of being infected with HIV. If you choose to begin PrEP, you should first be tested for HIV. You should then be tested every 3 months for as long as you are taking PrEP.  Osteoporosis is a disease in which the bones lose minerals and strength   with aging. This can result in serious bone fractures or breaks. The risk of osteoporosis can be identified using a bone density scan. Women ages 61 years and over and women at risk for fractures or osteoporosis should discuss screening with their health care providers. Ask your health care provider whether you should take a calcium supplement or vitamin D to reduce the rate of osteoporosis.  Menopause can be associated with physical symptoms and risks. Hormone replacement therapy is available to decrease symptoms and risks. You should talk to your health care provider about whether hormone replacement therapy is right for you.  Use sunscreen. Apply sunscreen liberally and repeatedly throughout the day. You should seek shade when your shadow is shorter than you. Protect yourself by wearing long sleeves, pants, a wide-brimmed hat, and sunglasses year round, whenever you are outdoors.  Once a month, do a whole body skin exam, using a mirror to look at the skin on your back. Tell your health care provider of new moles, moles that have irregular borders, moles that are larger than a pencil eraser, or moles that have changed in shape or color.  Stay current with required vaccines (immunizations).  Influenza vaccine. All adults should be immunized every year.  Tetanus, diphtheria, and acellular pertussis (Td, Tdap) vaccine. Pregnant women should  receive 1 dose of Tdap vaccine during each pregnancy. The dose should be obtained regardless of the length of time since the last dose. Immunization is preferred during the 27th-36th week of gestation. An adult who has not previously received Tdap or who does not know her vaccine status should receive 1 dose of Tdap. This initial dose should be followed by tetanus and diphtheria toxoids (Td) booster doses every 10 years. Adults with an unknown or incomplete history of completing a 3-dose immunization series with Td-containing vaccines should begin or complete a primary immunization series including a Tdap dose. Adults should receive a Td booster every 10 years.  Varicella vaccine. An adult without evidence of immunity to varicella should receive 2 doses or a second dose if she has previously received 1 dose. Pregnant females who do not have evidence of immunity should receive the first dose after pregnancy. This first dose should be obtained before leaving the health care facility. The second dose should be obtained 4-8 weeks after the first dose.  Human papillomavirus (HPV) vaccine. Females aged 13-26 years who have not received the vaccine previously should obtain the 3-dose series. The vaccine is not recommended for use in pregnant females. However, pregnancy testing is not needed before receiving a dose. If a female is found to be pregnant after receiving a dose, no treatment is needed. In that case, the remaining doses should be delayed until after the pregnancy. Immunization is recommended for any person with an immunocompromised condition through the age of 64 years if she did not get any or all doses earlier. During the 3-dose series, the second dose should be obtained 4-8 weeks after the first dose. The third dose should be obtained 24 weeks after the first dose and 16 weeks after the second dose.  Zoster vaccine. One dose is recommended for adults aged 62 years or older unless certain conditions are  present.  Measles, mumps, and rubella (MMR) vaccine. Adults born before [birth year] generally are considered immune to measles and mumps. Adults born in [birth year] or later should have 1 or more doses of MMR vaccine unless there is a contraindication to the vaccine or there is laboratory evidence of immunity to  each of the three diseases. A routine second dose of MMR vaccine should be obtained at least 28 days after the first dose for students attending postsecondary schools, health care workers, or international travelers. People who received inactivated measles vaccine or an unknown type of measles vaccine during 1963-1967 should receive 2 doses of MMR vaccine. People who received inactivated mumps vaccine or an unknown type of mumps vaccine before 1979 and are at high risk for mumps infection should consider immunization with 2 doses of MMR vaccine. For females of childbearing age, rubella immunity should be determined. If there is no evidence of immunity, females who are not pregnant should be vaccinated. If there is no evidence of immunity, females who are pregnant should delay immunization until after pregnancy. Unvaccinated health care workers born before 1957 who lack laboratory evidence of measles, mumps, or rubella immunity or laboratory confirmation of disease should consider measles and mumps immunization with 2 doses of MMR vaccine or rubella immunization with 1 dose of MMR vaccine.  Pneumococcal 13-valent conjugate (PCV13) vaccine. When indicated, a person who is uncertain of her immunization history and has no record of immunization should receive the PCV13 vaccine. An adult aged 19 years or older who has certain medical conditions and has not been previously immunized should receive 1 dose of PCV13 vaccine. This PCV13 should be followed with a dose of pneumococcal polysaccharide (PPSV23) vaccine. The PPSV23 vaccine dose should be obtained at least 8 weeks after the dose of PCV13 vaccine. An adult aged 19  years or older who has certain medical conditions and previously received 1 or more doses of PPSV23 vaccine should receive 1 dose of PCV13. The PCV13 vaccine dose should be obtained 1 or more years after the last PPSV23 vaccine dose.  Pneumococcal polysaccharide (PPSV23) vaccine. When PCV13 is also indicated, PCV13 should be obtained first. All adults aged 65 years and older should be immunized. An adult younger than age 65 years who has certain medical conditions should be immunized. Any person who resides in a nursing home or long-term care facility should be immunized. An adult smoker should be immunized. People with an immunocompromised condition and certain other conditions should receive both PCV13 and PPSV23 vaccines. People with human immunodeficiency virus (HIV) infection should be immunized as soon as possible after diagnosis. Immunization during chemotherapy or radiation therapy should be avoided. Routine use of PPSV23 vaccine is not recommended for American Indians, [place] Natives, or people younger than 65 years unless there are medical conditions that require PPSV23 vaccine. When indicated, people who have unknown immunization and have no record of immunization should receive PPSV23 vaccine. One-time revaccination 5 years after the first dose of PPSV23 is recommended for people aged 19-64 years who have chronic kidney failure, nephrotic syndrome, asplenia, or immunocompromised conditions. People who received 1-2 doses of PPSV23 before age 65 years should receive another dose of PPSV23 vaccine at age 65 years or later if at least 5 years have passed since the previous dose. Doses of PPSV23 are not needed for people immunized with PPSV23 at or after age 65 years.  Meningococcal vaccine. Adults with asplenia or persistent complement component deficiencies should receive 2 doses of quadrivalent meningococcal conjugate (MenACWY-D) vaccine. The doses should be obtained at least 2 months apart.  Microbiologists working with certain meningococcal bacteria, military recruits, people at risk during an outbreak, and people who travel to or live in countries with a high rate of meningitis should be immunized. A first-year college student up through age   21 years who is living in a residence hall should receive a dose if she did not receive a dose on or after her 16th birthday. Adults who have certain high-risk conditions should receive one or more doses of vaccine.  Hepatitis A vaccine. Adults who wish to be protected from this disease, have certain high-risk conditions, work with hepatitis A-infected animals, work in hepatitis A research labs, or travel to or work in countries with a high rate of hepatitis A should be immunized. Adults who were previously unvaccinated and who anticipate close contact with an international adoptee during the first 60 days after arrival in the [place] from a country with a high rate of hepatitis A should be immunized.  Hepatitis B vaccine. Adults who wish to be protected from this disease, have certain high-risk conditions, may be exposed to blood or other infectious body fluids, are household contacts or sex partners of hepatitis B positive people, are clients or workers in certain care facilities, or travel to or work in countries with a high rate of hepatitis B should be immunized.  Haemophilus influenzae type b (Hib) vaccine. A previously unvaccinated person with asplenia or sickle cell disease or having a scheduled splenectomy should receive 1 dose of Hib vaccine. Regardless of previous immunization, a recipient of a hematopoietic stem cell transplant should receive a 3-dose series 6-12 months after her successful transplant. Hib vaccine is not recommended for adults with HIV infection. Preventive Services / Frequency Ages 19 to 56 years  Blood pressure check.** / Every 1 to 2 years.  Lipid and cholesterol check.** / Every 5 years beginning at age  79.  Clinical breast exam.** / Every 3 years for women in their 36s and 12s.  BRCA-related cancer risk assessment.** / For women who have family members with a BRCA-related cancer (breast, ovarian, tubal, or peritoneal cancers).  Pap test.** / Every 2 years from ages 55 through 79. Every 3 years starting at age 45 through age 66 or 57 with a history of 3 consecutive normal Pap tests.  HPV screening.** / Every 3 years from ages 40 through ages 73 to 15 with a history of 3 consecutive normal Pap tests.  Hepatitis C blood test.** / For any individual with known risks for hepatitis C.  Skin self-exam. / Monthly.  Influenza vaccine. / Every year.  Tetanus, diphtheria, and acellular pertussis (Tdap, Td) vaccine.** / Consult your health care provider. Pregnant women should receive 1 dose of Tdap vaccine during each pregnancy. 1 dose of Td every 10 years.  Varicella vaccine.** / Consult your health care provider. Pregnant females who do not have evidence of immunity should receive the first dose after pregnancy.  HPV vaccine. / 3 doses over 6 months, if 59 and younger. The vaccine is not recommended for use in pregnant females. However, pregnancy testing is not needed before receiving a dose.  Measles, mumps, rubella (MMR) vaccine.** / You need at least 1 dose of MMR if you were born in 1957 or later. You may also need a 2nd dose. For females of childbearing age, rubella immunity should be determined. If there is no evidence of immunity, females who are not pregnant should be vaccinated. If there is no evidence of immunity, females who are pregnant should delay immunization until after pregnancy.  Pneumococcal 13-valent conjugate (PCV13) vaccine.** / Consult your health care provider.  Pneumococcal polysaccharide (PPSV23) vaccine.** / 1 to 2 doses if you smoke cigarettes or if you have certain conditions.  Meningococcal vaccine.** /  1 dose if you are age 19 to 21 years and a first-year college  student living in a residence hall, or have one of several medical conditions, you need to get vaccinated against meningococcal disease. You may also need additional booster doses.  Hepatitis A vaccine.** / Consult your health care provider.  Hepatitis B vaccine.** / Consult your health care provider.  Haemophilus influenzae type b (Hib) vaccine.** / Consult your health care provider. Ages 40 to 64 years  Blood pressure check.** / Every 1 to 2 years.  Lipid and cholesterol check.** / Every 5 years beginning at age 20 years.  Lung cancer screening. / Every year if you are aged 55-80 years and have a 30-pack-year history of smoking and currently smoke or have quit within the past 15 years. Yearly screening is stopped once you have quit smoking for at least 15 years or develop a health problem that would prevent you from having lung cancer treatment.  Clinical breast exam.** / Every year after age 40 years.  BRCA-related cancer risk assessment.** / For women who have family members with a BRCA-related cancer (breast, ovarian, tubal, or peritoneal cancers).  Mammogram.** / Every year beginning at age 40 years and continuing for as long as you are in good health. Consult with your health care provider.  Pap test.** / Every 3 years starting at age 30 years through age 65 or 70 years with a history of 3 consecutive normal Pap tests.  HPV screening.** / Every 3 years from ages 30 years through ages 65 to 70 years with a history of 3 consecutive normal Pap tests.  Fecal occult blood test (FOBT) of stool. / Every year beginning at age 50 years and continuing until age 75 years. You may not need to do this test if you get a colonoscopy every 10 years.  Flexible sigmoidoscopy or colonoscopy.** / Every 5 years for a flexible sigmoidoscopy or every 10 years for a colonoscopy beginning at age 50 years and continuing until age 75 years.  Hepatitis C blood test.** / For all people born from 1945 through  1965 and any individual with known risks for hepatitis C.  Skin self-exam. / Monthly.  Influenza vaccine. / Every year.  Tetanus, diphtheria, and acellular pertussis (Tdap/Td) vaccine.** / Consult your health care provider. Pregnant women should receive 1 dose of Tdap vaccine during each pregnancy. 1 dose of Td every 10 years.  Varicella vaccine.** / Consult your health care provider. Pregnant females who do not have evidence of immunity should receive the first dose after pregnancy.  Zoster vaccine.** / 1 dose for adults aged 60 years or older.  Measles, mumps, rubella (MMR) vaccine.** / You need at least 1 dose of MMR if you were born in 1957 or later. You may also need a 2nd dose. For females of childbearing age, rubella immunity should be determined. If there is no evidence of immunity, females who are not pregnant should be vaccinated. If there is no evidence of immunity, females who are pregnant should delay immunization until after pregnancy.  Pneumococcal 13-valent conjugate (PCV13) vaccine.** / Consult your health care provider.  Pneumococcal polysaccharide (PPSV23) vaccine.** / 1 to 2 doses if you smoke cigarettes or if you have certain conditions.  Meningococcal vaccine.** / Consult your health care provider.  Hepatitis A vaccine.** / Consult your health care provider.  Hepatitis B vaccine.** / Consult your health care provider.  Haemophilus influenzae type b (Hib) vaccine.** / Consult your health care provider. Ages 65   years and over  Blood pressure check.** / Every 1 to 2 years.  Lipid and cholesterol check.** / Every 5 years beginning at age 22 years.  Lung cancer screening. / Every year if you are aged 73-80 years and have a 30-pack-year history of smoking and currently smoke or have quit within the past 15 years. Yearly screening is stopped once you have quit smoking for at least 15 years or develop a health problem that would prevent you from having lung cancer  treatment.  Clinical breast exam.** / Every year after age 4 years.  BRCA-related cancer risk assessment.** / For women who have family members with a BRCA-related cancer (breast, ovarian, tubal, or peritoneal cancers).  Mammogram.** / Every year beginning at age 40 years and continuing for as long as you are in good health. Consult with your health care provider.  Pap test.** / Every 3 years starting at age 9 years through age 34 or 91 years with 3 consecutive normal Pap tests. Testing can be stopped between 65 and 70 years with 3 consecutive normal Pap tests and no abnormal Pap or HPV tests in the past 10 years.  HPV screening.** / Every 3 years from ages 57 years through ages 64 or 45 years with a history of 3 consecutive normal Pap tests. Testing can be stopped between 65 and 70 years with 3 consecutive normal Pap tests and no abnormal Pap or HPV tests in the past 10 years.  Fecal occult blood test (FOBT) of stool. / Every year beginning at age 15 years and continuing until age 17 years. You may not need to do this test if you get a colonoscopy every 10 years.  Flexible sigmoidoscopy or colonoscopy.** / Every 5 years for a flexible sigmoidoscopy or every 10 years for a colonoscopy beginning at age 86 years and continuing until age 71 years.  Hepatitis C blood test.** / For all people born from [birth year] through 1964 and any individual with known risks for hepatitis C.  Osteoporosis screening.** / A one-time screening for women ages 83 years and over and women at risk for fractures or osteoporosis.  Skin self-exam. / Monthly.  Influenza vaccine. / Every year.  Tetanus, diphtheria, and acellular pertussis (Tdap/Td) vaccine.** / 1 dose of Td every 10 years.  Varicella vaccine.** / Consult your health care provider.  Zoster vaccine.** / 1 dose for adults aged 61 years or older.  Pneumococcal 13-valent conjugate (PCV13) vaccine.** / Consult your health care provider.  Pneumococcal  polysaccharide (PPSV23) vaccine.** / 1 dose for all adults aged 28 years and older.  Meningococcal vaccine.** / Consult your health care provider.  Hepatitis A vaccine.** / Consult your health care provider.  Hepatitis B vaccine.** / Consult your health care provider.  Haemophilus influenzae type b (Hib) vaccine.** / Consult your health care provider. ** Family history and personal history of risk and conditions may change your health care provider's recommendations. Document Released: 05/20/2001 Document Revised: 08/08/2013 Document Reviewed: 08/19/2010 [facility] Patient Information 2015 [place], [place]. This information is not intended to replace advice given to you by your health care provider. Make sure you discuss any questions you have with your health care provider.

## 2015-01-26 ENCOUNTER — Other Ambulatory Visit: Payer: Self-pay | Admitting: Family Medicine

## 2015-01-26 ENCOUNTER — Other Ambulatory Visit: Payer: Self-pay

## 2015-01-26 DIAGNOSIS — F17200 Nicotine dependence, unspecified, uncomplicated: Secondary | ICD-10-CM

## 2015-01-26 DIAGNOSIS — F430 Acute stress reaction: Secondary | ICD-10-CM

## 2015-01-26 MED ORDER — BUPROPION HCL ER (XL) 300 MG PO TB24: 300.0000 mg | ORAL_TABLET | Freq: Every day | ORAL | Status: DC

## 2015-08-15 DIAGNOSIS — N911 Secondary amenorrhea: Secondary | ICD-10-CM | POA: Diagnosis not present

## 2015-08-22 DIAGNOSIS — Z36 Encounter for antenatal screening of mother: Secondary | ICD-10-CM | POA: Diagnosis not present

## 2015-08-22 DIAGNOSIS — Z3401 Encounter for supervision of normal first pregnancy, first trimester: Secondary | ICD-10-CM | POA: Diagnosis not present

## 2015-08-22 DIAGNOSIS — Z3A09 9 weeks gestation of pregnancy: Secondary | ICD-10-CM | POA: Diagnosis not present

## 2015-08-24 LAB — OB RESULTS CONSOLE ABO/RH: RH TYPE: POSITIVE

## 2015-08-24 LAB — OB RESULTS CONSOLE HIV ANTIBODY (ROUTINE TESTING): HIV: NONREACTIVE

## 2015-08-24 LAB — OB RESULTS CONSOLE GC/CHLAMYDIA
CHLAMYDIA, DNA PROBE: NEGATIVE
Gonorrhea: NEGATIVE

## 2015-08-24 LAB — OB RESULTS CONSOLE RUBELLA ANTIBODY, IGM: RUBELLA: IMMUNE

## 2015-08-24 LAB — OB RESULTS CONSOLE HEPATITIS B SURFACE ANTIGEN: HEP B S AG: NEGATIVE

## 2015-08-24 LAB — OB RESULTS CONSOLE RPR: RPR: NONREACTIVE

## 2015-09-06 DIAGNOSIS — Z113 Encounter for screening for infections with a predominantly sexual mode of transmission: Secondary | ICD-10-CM | POA: Diagnosis not present

## 2015-09-06 DIAGNOSIS — Z3A12 12 weeks gestation of pregnancy: Secondary | ICD-10-CM | POA: Diagnosis not present

## 2015-09-06 DIAGNOSIS — Z36 Encounter for antenatal screening of mother: Secondary | ICD-10-CM | POA: Diagnosis not present

## 2015-09-06 DIAGNOSIS — Z3491 Encounter for supervision of normal pregnancy, unspecified, first trimester: Secondary | ICD-10-CM | POA: Diagnosis not present

## 2015-09-07 DIAGNOSIS — Z36 Encounter for antenatal screening of mother: Secondary | ICD-10-CM | POA: Diagnosis not present

## 2015-09-07 DIAGNOSIS — Z3491 Encounter for supervision of normal pregnancy, unspecified, first trimester: Secondary | ICD-10-CM | POA: Diagnosis not present

## 2015-09-10 DIAGNOSIS — Z3A12 12 weeks gestation of pregnancy: Secondary | ICD-10-CM | POA: Diagnosis not present

## 2015-09-10 DIAGNOSIS — Z36 Encounter for antenatal screening of mother: Secondary | ICD-10-CM | POA: Diagnosis not present

## 2015-10-05 DIAGNOSIS — Z3482 Encounter for supervision of other normal pregnancy, second trimester: Secondary | ICD-10-CM | POA: Diagnosis not present

## 2015-10-05 DIAGNOSIS — Z130 Encounter for screening for diseases of the blood and blood-forming organs and certain disorders involving the immune mechanism: Secondary | ICD-10-CM | POA: Diagnosis not present

## 2015-10-05 DIAGNOSIS — Z36 Encounter for antenatal screening of mother: Secondary | ICD-10-CM | POA: Diagnosis not present

## 2015-10-05 DIAGNOSIS — Z13228 Encounter for screening for other metabolic disorders: Secondary | ICD-10-CM | POA: Diagnosis not present

## 2015-10-05 DIAGNOSIS — Z31430 Encounter of female for testing for genetic disease carrier status for procreative management: Secondary | ICD-10-CM | POA: Diagnosis not present

## 2015-10-25 DIAGNOSIS — Z36 Encounter for antenatal screening of mother: Secondary | ICD-10-CM | POA: Diagnosis not present

## 2015-10-25 DIAGNOSIS — Z3402 Encounter for supervision of normal first pregnancy, second trimester: Secondary | ICD-10-CM | POA: Diagnosis not present

## 2015-10-25 DIAGNOSIS — Z3A19 19 weeks gestation of pregnancy: Secondary | ICD-10-CM | POA: Diagnosis not present

## 2015-12-20 ENCOUNTER — Ambulatory Visit (INDEPENDENT_AMBULATORY_CARE_PROVIDER_SITE_OTHER): Payer: 59 | Admitting: Family Medicine

## 2015-12-20 ENCOUNTER — Encounter: Payer: Self-pay | Admitting: Family Medicine

## 2015-12-20 VITALS — BP 92/50 | HR 77 | Temp 98.0°F | Resp 16 | Ht 63.0 in | Wt 183.0 lb

## 2015-12-20 DIAGNOSIS — Z0000 Encounter for general adult medical examination without abnormal findings: Secondary | ICD-10-CM

## 2015-12-20 LAB — CBC WITH DIFFERENTIAL/PLATELET
BASOS PCT: 0.3 % (ref 0.0–3.0)
Basophils Absolute: 0 10*3/uL (ref 0.0–0.1)
EOS ABS: 0.4 10*3/uL (ref 0.0–0.7)
EOS PCT: 3.2 % (ref 0.0–5.0)
HCT: 30.6 % — ABNORMAL LOW (ref 36.0–46.0)
HEMOGLOBIN: 10.5 g/dL — AB (ref 12.0–15.0)
LYMPHS ABS: 2.5 10*3/uL (ref 0.7–4.0)
Lymphocytes Relative: 20.1 % (ref 12.0–46.0)
MCHC: 34.3 g/dL (ref 30.0–36.0)
MCV: 97.1 fl (ref 78.0–100.0)
MONO ABS: 1 10*3/uL (ref 0.1–1.0)
Monocytes Relative: 7.8 % (ref 3.0–12.0)
NEUTROS PCT: 68.6 % (ref 43.0–77.0)
Neutro Abs: 8.5 10*3/uL — ABNORMAL HIGH (ref 1.4–7.7)
PLATELETS: 337 10*3/uL (ref 150.0–400.0)
RBC: 3.15 Mil/uL — ABNORMAL LOW (ref 3.87–5.11)
RDW: 13.7 % (ref 11.5–15.5)
WBC: 12.4 10*3/uL — AB (ref 4.0–10.5)

## 2015-12-20 LAB — COMPREHENSIVE METABOLIC PANEL
ALT: 44 U/L — ABNORMAL HIGH (ref 0–35)
AST: 31 U/L (ref 0–37)
Albumin: 3.7 g/dL (ref 3.5–5.2)
Alkaline Phosphatase: 86 U/L (ref 39–117)
BUN: 9 mg/dL (ref 6–23)
CHLORIDE: 105 meq/L (ref 96–112)
CO2: 25 mEq/L (ref 19–32)
Calcium: 8.9 mg/dL (ref 8.4–10.5)
Creatinine, Ser: 0.51 mg/dL (ref 0.40–1.20)
GFR: 177.34 mL/min (ref >60.00)
GLUCOSE: 67 mg/dL — AB (ref 70–99)
POTASSIUM: 3.8 meq/L (ref 3.5–5.1)
SODIUM: 137 meq/L (ref 135–145)
Total Bilirubin: 0.2 mg/dL (ref 0.2–1.2)
Total Protein: 7 g/dL (ref 6.0–8.3)

## 2015-12-20 LAB — POCT URINALYSIS DIPSTICK
Bilirubin, UA: NEGATIVE
Glucose, UA: NEGATIVE
KETONES UA: NEGATIVE
Leukocytes, UA: NEGATIVE
Nitrite, UA: NEGATIVE
PH UA: 6
PROTEIN UA: NEGATIVE
RBC UA: NEGATIVE
UROBILINOGEN UA: 0.2

## 2015-12-20 LAB — LIPID PANEL
CHOLESTEROL: 214 mg/dL — AB (ref 0–200)
HDL: 64.8 mg/dL (ref >39.00)
LDL CALC: 126 mg/dL — AB (ref 0–99)
NonHDL: 149.47
Total CHOL/HDL Ratio: 3
Triglycerides: 119 mg/dL (ref 0.0–149.0)
VLDL: 23.8 mg/dL (ref 0.0–40.0)

## 2015-12-20 LAB — TSH: TSH: 3.38 u[IU]/mL (ref 0.35–4.50)

## 2015-12-20 NOTE — Progress Notes (Signed)
Subjective:     Marissa Crawford is a 35 y.o. female and is here for a comprehensive physical exam. The patient reports she is [redacted] weeks pregnant with a little girl.  .  Social History   Social History  . Marital status: Single    Spouse name: N/A  . Number of children: N/A  . Years of education: N/A   Occupational History  . 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 Smoking status: Former Smoker    Packs/day: 1.00    Years: 11.00    Types: Cigarettes    Quit date: 08/06/2015  . Smokeless tobacco: Never Used  . Alcohol use 1.0 oz/week    2 Standard drinks or equivalent per week  . Drug use: No  . Sexual activity: Yes    Partners: Male    Birth control/ protection: Condom, None   Other Topics Concern  . Not on file   Social History Narrative   Exercise ---cardio   Health Maintenance  Topic Date Due  . TETANUS/TDAP  04/08/2015  . PAP SMEAR  11/17/2015  . INFLUENZA VACCINE  12/19/2016 (Originally 11/06/2015)  . HIV Screening  Completed    The following portions of the patient's history were reviewed and updated as appropriate:  She  has no past medical history on file. She  does not have a problem list on file. She  has no past surgical history on file. Her family history includes Alcohol abuse in her father; Cancer (age of onset: 72) in her mother; Cervical cancer in her mother; Diabetes in her brother, maternal grandfather, maternal grandmother, paternal grandfather, and paternal grandmother; Gout in her father; Hypertension in her father; Seizures in her sister; Stroke in her maternal grandfather and paternal grandmother. She  reports that she quit smoking about 4 months ago. Her smoking use included Cigarettes. She has a 11.00 pack-year smoking history. She has never used smokeless tobacco. She reports that she drinks about 1.0 oz of alcohol per week . She reports that she does not use drugs. She has a current medication list which includes the following  prescription(s): bupropion and bupropion. Current Outpatient Prescriptions on File Prior to Visit  Medication Sig Dispense Refill  . buPROPion (WELLBUTRIN XL) 150 MG 24 hr tablet 1 po qam x 1 week then increase to 2 po qd (Patient not taking: Reported on 12/20/2015) 60 tablet 0  . buPROPion (WELLBUTRIN XL) 300 MG 24 hr tablet Take 1 tablet (300 mg total) by mouth daily. (Patient not taking: Reported on 12/20/2015) 90 tablet 1   No current facility-administered medications on file prior to visit.    She has No Known Allergies..  Review of Systems Review of Systems  Constitutional: Negative for activity change, appetite change and fatigue.  HENT: Negative for hearing loss, congestion, tinnitus and ear discharge.  dentist q22m Eyes: Negative for visual disturbance (see optho q1y -- vision corrected to 20/20 with glasses).  Respiratory: Negative for cough, chest tightness and shortness of breath.   Cardiovascular: Negative for chest pain, palpitations and leg swelling.  Gastrointestinal: Negative for abdominal pain, diarrhea, constipation and abdominal distention.  Genitourinary: Negative for urgency, frequency, decreased urine volume and difficulty urinating.  Musculoskeletal: Negative for back pain, arthralgias and gait problem.  Skin: Negative for color change, pallor and rash.  Neurological: Negative for dizziness, light-headedness, numbness and headaches.  Hematological: Negative for adenopathy. Does not bruise/bleed easily.  Psychiatric/Behavioral: Negative for suicidal ideas, confusion, sleep disturbance, self-injury,  dysphoric mood, decreased concentration and agitation.       Objective:  BP (!) 92/50 (BP Location: Right Arm, Patient Position: Sitting, Cuff Size: Normal)   Pulse 77   Temp 98 F (36.7 C) (Oral)   Resp 16   Ht 5\' 3"  (1.6 m)   Wt 183 lb (83 kg)   SpO2 99%   BMI 32.42 kg/m  General appearance: alert, cooperative, appears stated age and no distress Head:  Normocephalic, without obvious abnormality, atraumatic Eyes: conjunctivae/corneas clear. PERRL, EOM's intact. Fundi benign. Ears: normal TM's and external ear canals both ears Nose: Nares normal. Septum midline. Mucosa normal. No drainage or sinus tenderness. Throat: lips, mucosa, and tongue normal; teeth and gums normal Neck: no adenopathy, no carotid bruit, no JVD, supple, symmetrical, trachea midline and thyroid not enlarged, symmetric, no tenderness/mass/nodules Back: symmetric, no curvature. ROM normal. No CVA tenderness. Lungs: clear to auscultation bilaterally Breasts: gyn Heart: regular rate and rhythm, S1, S2 normal, no murmur, click, rub or gallop Abdomen: gravid Pelvic: deferred--gyn Extremities: extremities normal, atraumatic, no cyanosis or edema Pulses: 2+ and symmetric Skin: Skin color, texture, turgor normal. No rashes or lesions Lymph nodes: Cervical, supraclavicular, and axillary nodes normal. Neurologic: Alert and oriented X 3, normal strength and tone. Normal symmetric reflexes. Normal coordination and gait    Assessment:    Healthy female exam.   Plan:    ghm utd Check labs See After Visit Summary for Counseling Recommendations    1. Preventative health care See above - Comprehensive metabolic panel - Lipid panel - CBC with Differential/Platelet - POCT urinalysis dipstick - TSH

## 2015-12-20 NOTE — Patient Instructions (Signed)
Preventive Care for Adults, Female A healthy lifestyle and preventive care can promote health and wellness. Preventive health guidelines for women include the following key practices.  A routine yearly physical is a good way to check with your health care provider about your health and preventive screening. It is a chance to share any concerns and updates on your health and to receive a thorough exam.  Visit your dentist for a routine exam and preventive care every 6 months. Brush your teeth twice a day and floss once a day. Good oral hygiene prevents tooth decay and gum disease.  The frequency of eye exams is based on your age, health, family medical history, use of contact lenses, and other factors. Follow your health care provider's recommendations for frequency of eye exams.  Eat a healthy diet. Foods like vegetables, fruits, whole grains, low-fat dairy products, and lean protein foods contain the nutrients you need without too many calories. Decrease your intake of foods high in solid fats, added sugars, and salt. Eat the right amount of calories for you.Get information about a proper diet from your health care provider, if necessary.  Regular physical exercise is one of the most important things you can do for your health. Most adults should get at least 150 minutes of moderate-intensity exercise (any activity that increases your heart rate and causes you to sweat) each week. In addition, most adults need muscle-strengthening exercises on 2 or more days a week.  Maintain a healthy weight. The body mass index (BMI) is a screening tool to identify possible weight problems. It provides an estimate of body fat based on height and weight. Your health care provider can find your BMI and can help you achieve or maintain a healthy weight.For adults 20 years and older:  A BMI below 18.5 is considered underweight.  A BMI of 18.5 to 24.9 is normal.  A BMI of 25 to 29.9 is considered overweight.  A  BMI of 30 and above is considered obese.  Maintain normal blood lipids and cholesterol levels by exercising and minimizing your intake of saturated fat. Eat a balanced diet with plenty of fruit and vegetables. Blood tests for lipids and cholesterol should begin at age 45 and be repeated every 5 years. If your lipid or cholesterol levels are high, you are over 50, or you are at high risk for heart disease, you may need your cholesterol levels checked more frequently.Ongoing high lipid and cholesterol levels should be treated with medicines if diet and exercise are not working.  If you smoke, find out from your health care provider how to quit. If you do not use tobacco, do not start.  Lung cancer screening is recommended for adults aged 45-80 years who are at high risk for developing lung cancer because of a history of smoking. A yearly low-dose CT scan of the lungs is recommended for people who have at least a 30-pack-year history of smoking and are a current smoker or have quit within the past 15 years. A pack year of smoking is smoking an average of 1 pack of cigarettes a day for 1 year (for example: 1 pack a day for 30 years or 2 packs a day for 15 years). Yearly screening should continue until the smoker has stopped smoking for at least 15 years. Yearly screening should be stopped for people who develop a health problem that would prevent them from having lung cancer treatment.  If you are pregnant, do not drink alcohol. If you are  breastfeeding, be very cautious about drinking alcohol. If you are not pregnant and choose to drink alcohol, do not have more than 1 drink per day. One drink is considered to be 12 ounces (355 mL) of beer, 5 ounces (148 mL) of wine, or 1.5 ounces (44 mL) of liquor.  Avoid use of street drugs. Do not share needles with anyone. Ask for help if you need support or instructions about stopping the use of drugs.  High blood pressure causes heart disease and increases the risk  of stroke. Your blood pressure should be checked at least every 1 to 2 years. Ongoing high blood pressure should be treated with medicines if weight loss and exercise do not work.  If you are 55-79 years old, ask your health care provider if you should take aspirin to prevent strokes.  Diabetes screening is done by taking a blood sample to check your blood glucose level after you have not eaten for a certain period of time (fasting). If you are not overweight and you do not have risk factors for diabetes, you should be screened once every 3 years starting at age 45. If you are overweight or obese and you are 40-70 years of age, you should be screened for diabetes every year as part of your cardiovascular risk assessment.  Breast cancer screening is essential preventive care for women. You should practice "breast self-awareness." This means understanding the normal appearance and feel of your breasts and may include breast self-examination. Any changes detected, no matter how small, should be reported to a health care provider. Women in their 20s and 30s should have a clinical breast exam (CBE) by a health care provider as part of a regular health exam every 1 to 3 years. After age 40, women should have a CBE every year. Starting at age 40, women should consider having a mammogram (breast X-ray test) every year. Women who have a family history of breast cancer should talk to their health care provider about genetic screening. Women at a high risk of breast cancer should talk to their health care providers about having an MRI and a mammogram every year.  Breast cancer gene (BRCA)-related cancer risk assessment is recommended for women who have family members with BRCA-related cancers. BRCA-related cancers include breast, ovarian, tubal, and peritoneal cancers. Having family members with these cancers may be associated with an increased risk for harmful changes (mutations) in the breast cancer genes BRCA1 and  BRCA2. Results of the assessment will determine the need for genetic counseling and BRCA1 and BRCA2 testing.  Your health care provider may recommend that you be screened regularly for cancer of the pelvic organs (ovaries, uterus, and vagina). This screening involves a pelvic examination, including checking for microscopic changes to the surface of your cervix (Pap test). You may be encouraged to have this screening done every 3 years, beginning at age 21.  For women ages 30-65, health care providers may recommend pelvic exams and Pap testing every 3 years, or they may recommend the Pap and pelvic exam, combined with testing for human papilloma virus (HPV), every 5 years. Some types of HPV increase your risk of cervical cancer. Testing for HPV may also be done on women of any age with unclear Pap test results.  Other health care providers may not recommend any screening for nonpregnant women who are considered low risk for pelvic cancer and who do not have symptoms. Ask your health care provider if a screening pelvic exam is right for   you.  If you have had past treatment for cervical cancer or a condition that could lead to cancer, you need Pap tests and screening for cancer for at least 20 years after your treatment. If Pap tests have been discontinued, your risk factors (such as having a new sexual partner) need to be reassessed to determine if screening should resume. Some women have medical problems that increase the chance of getting cervical cancer. In these cases, your health care provider may recommend more frequent screening and Pap tests.  Colorectal cancer can be detected and often prevented. Most routine colorectal cancer screening begins at the age of 50 years and continues through age 75 years. However, your health care provider may recommend screening at an earlier age if you have risk factors for colon cancer. On a yearly basis, your health care provider may provide home test kits to check  for hidden blood in the stool. Use of a small camera at the end of a tube, to directly examine the colon (sigmoidoscopy or colonoscopy), can detect the earliest forms of colorectal cancer. Talk to your health care provider about this at age 50, when routine screening begins. Direct exam of the colon should be repeated every 5-10 years through age 75 years, unless early forms of precancerous polyps or small growths are found.  People who are at an increased risk for hepatitis B should be screened for this virus. You are considered at high risk for hepatitis B if:  You were born in a country where hepatitis B occurs often. Talk with your health care provider about which countries are considered high risk.  Your parents were born in a high-risk country and you have not received a shot to protect against hepatitis B (hepatitis B vaccine).  You have HIV or AIDS.  You use needles to inject street drugs.  You live with, or have sex with, someone who has hepatitis B.  You get hemodialysis treatment.  You take certain medicines for conditions like cancer, organ transplantation, and autoimmune conditions.  Hepatitis C blood testing is recommended for all people born from 1945 through 1965 and any individual with known risks for hepatitis C.  Practice safe sex. Use condoms and avoid high-risk sexual practices to reduce the spread of sexually transmitted infections (STIs). STIs include gonorrhea, chlamydia, syphilis, trichomonas, herpes, HPV, and human immunodeficiency virus (HIV). Herpes, HIV, and HPV are viral illnesses that have no cure. They can result in disability, cancer, and death.  You should be screened for sexually transmitted illnesses (STIs) including gonorrhea and chlamydia if:  You are sexually active and are younger than 24 years.  You are older than 24 years and your health care provider tells you that you are at risk for this type of infection.  Your sexual activity has changed  since you were last screened and you are at an increased risk for chlamydia or gonorrhea. Ask your health care provider if you are at risk.  If you are at risk of being infected with HIV, it is recommended that you take a prescription medicine daily to prevent HIV infection. This is called preexposure prophylaxis (PrEP). You are considered at risk if:  You are sexually active and do not regularly use condoms or know the HIV status of your partner(s).  You take drugs by injection.  You are sexually active with a partner who has HIV.  Talk with your health care provider about whether you are at high risk of being infected with HIV. If   you choose to begin PrEP, you should first be tested for HIV. You should then be tested every 3 months for as long as you are taking PrEP.  Osteoporosis is a disease in which the bones lose minerals and strength with aging. This can result in serious bone fractures or breaks. The risk of osteoporosis can be identified using a bone density scan. Women ages 67 years and over and women at risk for fractures or osteoporosis should discuss screening with their health care providers. Ask your health care provider whether you should take a calcium supplement or vitamin D to reduce the rate of osteoporosis.  Menopause can be associated with physical symptoms and risks. Hormone replacement therapy is available to decrease symptoms and risks. You should talk to your health care provider about whether hormone replacement therapy is right for you.  Use sunscreen. Apply sunscreen liberally and repeatedly throughout the day. You should seek shade when your shadow is shorter than you. Protect yourself by wearing long sleeves, pants, a wide-brimmed hat, and sunglasses year round, whenever you are outdoors.  Once a month, do a whole body skin exam, using a mirror to look at the skin on your back. Tell your health care provider of new moles, moles that have irregular borders, moles that  are larger than a pencil eraser, or moles that have changed in shape or color.  Stay current with required vaccines (immunizations).  Influenza vaccine. All adults should be immunized every year.  Tetanus, diphtheria, and acellular pertussis (Td, Tdap) vaccine. Pregnant women should receive 1 dose of Tdap vaccine during each pregnancy. The dose should be obtained regardless of the length of time since the last dose. Immunization is preferred during the 27th-36th week of gestation. An adult who has not previously received Tdap or who does not know her vaccine status should receive 1 dose of Tdap. This initial dose should be followed by tetanus and diphtheria toxoids (Td) booster doses every 10 years. Adults with an unknown or incomplete history of completing a 3-dose immunization series with Td-containing vaccines should begin or complete a primary immunization series including a Tdap dose. Adults should receive a Td booster every 10 years.  Varicella vaccine. An adult without evidence of immunity to varicella should receive 2 doses or a second dose if she has previously received 1 dose. Pregnant females who do not have evidence of immunity should receive the first dose after pregnancy. This first dose should be obtained before leaving the health care facility. The second dose should be obtained 4-8 weeks after the first dose.  Human papillomavirus (HPV) vaccine. Females aged 13-26 years who have not received the vaccine previously should obtain the 3-dose series. The vaccine is not recommended for use in pregnant females. However, pregnancy testing is not needed before receiving a dose. If a female is found to be pregnant after receiving a dose, no treatment is needed. In that case, the remaining doses should be delayed until after the pregnancy. Immunization is recommended for any person with an immunocompromised condition through the age of 61 years if she did not get any or all doses earlier. During the  3-dose series, the second dose should be obtained 4-8 weeks after the first dose. The third dose should be obtained 24 weeks after the first dose and 16 weeks after the second dose.  Zoster vaccine. One dose is recommended for adults aged 30 years or older unless certain conditions are present.  Measles, mumps, and rubella (MMR) vaccine. Adults born  before 1957 generally are considered immune to measles and mumps. Adults born in 1957 or later should have 1 or more doses of MMR vaccine unless there is a contraindication to the vaccine or there is laboratory evidence of immunity to each of the three diseases. A routine second dose of MMR vaccine should be obtained at least 28 days after the first dose for students attending postsecondary schools, health care workers, or international travelers. People who received inactivated measles vaccine or an unknown type of measles vaccine during 1963-1967 should receive 2 doses of MMR vaccine. People who received inactivated mumps vaccine or an unknown type of mumps vaccine before 1979 and are at high risk for mumps infection should consider immunization with 2 doses of MMR vaccine. For females of childbearing age, rubella immunity should be determined. If there is no evidence of immunity, females who are not pregnant should be vaccinated. If there is no evidence of immunity, females who are pregnant should delay immunization until after pregnancy. Unvaccinated health care workers born before 1957 who lack laboratory evidence of measles, mumps, or rubella immunity or laboratory confirmation of disease should consider measles and mumps immunization with 2 doses of MMR vaccine or rubella immunization with 1 dose of MMR vaccine.  Pneumococcal 13-valent conjugate (PCV13) vaccine. When indicated, a person who is uncertain of his immunization history and has no record of immunization should receive the PCV13 vaccine. All adults 65 years of age and older should receive this  vaccine. An adult aged 19 years or older who has certain medical conditions and has not been previously immunized should receive 1 dose of PCV13 vaccine. This PCV13 should be followed with a dose of pneumococcal polysaccharide (PPSV23) vaccine. Adults who are at high risk for pneumococcal disease should obtain the PPSV23 vaccine at least 8 weeks after the dose of PCV13 vaccine. Adults older than 34 years of age who have normal immune system function should obtain the PPSV23 vaccine dose at least 1 year after the dose of PCV13 vaccine.  Pneumococcal polysaccharide (PPSV23) vaccine. When PCV13 is also indicated, PCV13 should be obtained first. All adults aged 65 years and older should be immunized. An adult younger than age 65 years who has certain medical conditions should be immunized. Any person who resides in a nursing home or long-term care facility should be immunized. An adult smoker should be immunized. People with an immunocompromised condition and certain other conditions should receive both PCV13 and PPSV23 vaccines. People with human immunodeficiency virus (HIV) infection should be immunized as soon as possible after diagnosis. Immunization during chemotherapy or radiation therapy should be avoided. Routine use of PPSV23 vaccine is not recommended for American Indians, [place] Natives, or people younger than 65 years unless there are medical conditions that require PPSV23 vaccine. When indicated, people who have unknown immunization and have no record of immunization should receive PPSV23 vaccine. One-time revaccination 5 years after the first dose of PPSV23 is recommended for people aged 19-64 years who have chronic kidney failure, nephrotic syndrome, asplenia, or immunocompromised conditions. People who received 1-2 doses of PPSV23 before age 65 years should receive another dose of PPSV23 vaccine at age 65 years or later if at least 5 years have passed since the previous dose. Doses of PPSV23 are not  needed for people immunized with PPSV23 at or after age 65 years.  Meningococcal vaccine. Adults with asplenia or persistent complement component deficiencies should receive 2 doses of quadrivalent meningococcal conjugate (MenACWY-D) vaccine. The doses should be obtained   at least 2 months apart. Microbiologists working with certain meningococcal bacteria, [place] recruits, people at risk during an outbreak, and people who travel to or live in countries with a high rate of meningitis should be immunized. A first-year college student up through age 34 years who is living in a residence hall should receive a dose if she did not receive a dose on or after her 16th birthday. Adults who have certain high-risk conditions should receive one or more doses of vaccine.  Hepatitis A vaccine. Adults who wish to be protected from this disease, have certain high-risk conditions, work with hepatitis A-infected animals, work in hepatitis A research labs, or travel to or work in countries with a high rate of hepatitis A should be immunized. Adults who were previously unvaccinated and who anticipate close contact with an international adoptee during the first 60 days after arrival in the [place] from a country with a high rate of hepatitis A should be immunized.  Hepatitis B vaccine. Adults who wish to be protected from this disease, have certain high-risk conditions, may be exposed to blood or other infectious body fluids, are household contacts or sex partners of hepatitis B positive people, are clients or workers in certain care facilities, or travel to or work in countries with a high rate of hepatitis B should be immunized.  Haemophilus influenzae type b (Hib) vaccine. A previously unvaccinated person with asplenia or sickle cell disease or having a scheduled splenectomy should receive 1 dose of Hib vaccine. Regardless of previous immunization, a recipient of a hematopoietic stem cell transplant should receive a  3-dose series 6-12 months after her successful transplant. Hib vaccine is not recommended for adults with HIV infection. Preventive Services / Frequency Ages 35 to 4 years  Blood pressure check.** / Every 3-5 years.  Lipid and cholesterol check.** / Every 5 years beginning at age 60.  Clinical breast exam.** / Every 3 years for women in their 71s and 10s.  BRCA-related cancer risk assessment.** / For women who have family members with a BRCA-related cancer (breast, ovarian, tubal, or peritoneal cancers).  Pap test.** / Every 2 years from ages 76 through 26. Every 3 years starting at age 61 through age 76 or 93 with a history of 3 consecutive normal Pap tests.  HPV screening.** / Every 3 years from ages 37 through ages 60 to 51 with a history of 3 consecutive normal Pap tests.  Hepatitis C blood test.** / For any individual with known risks for hepatitis C.  Skin self-exam. / Monthly.  Influenza vaccine. / Every year.  Tetanus, diphtheria, and acellular pertussis (Tdap, Td) vaccine.** / Consult your health care provider. Pregnant women should receive 1 dose of Tdap vaccine during each pregnancy. 1 dose of Td every 10 years.  Varicella vaccine.** / Consult your health care provider. Pregnant females who do not have evidence of immunity should receive the first dose after pregnancy.  HPV vaccine. / 3 doses over 6 months, if 93 and younger. The vaccine is not recommended for use in pregnant females. However, pregnancy testing is not needed before receiving a dose.  Measles, mumps, rubella (MMR) vaccine.** / You need at least 1 dose of MMR if you were born in 1957 or later. You may also need a 2nd dose. For females of childbearing age, rubella immunity should be determined. If there is no evidence of immunity, females who are not pregnant should be vaccinated. If there is no evidence of immunity, females who are  pregnant should delay immunization until after pregnancy.  Pneumococcal  13-valent conjugate (PCV13) vaccine.** / Consult your health care provider.  Pneumococcal polysaccharide (PPSV23) vaccine.** / 1 to 2 doses if you smoke cigarettes or if you have certain conditions.  Meningococcal vaccine.** / 1 dose if you are age 68 to 8 years and a Market researcher living in a residence hall, or have one of several medical conditions, you need to get vaccinated against meningococcal disease. You may also need additional booster doses.  Hepatitis A vaccine.** / Consult your health care provider.  Hepatitis B vaccine.** / Consult your health care provider.  Haemophilus influenzae type b (Hib) vaccine.** / Consult your health care provider. Ages 7 to 53 years  Blood pressure check.** / Every year.  Lipid and cholesterol check.** / Every 5 years beginning at age 25 years.  Lung cancer screening. / Every year if you are aged 11-80 years and have a 30-pack-year history of smoking and currently smoke or have quit within the past 15 years. Yearly screening is stopped once you have quit smoking for at least 15 years or develop a health problem that would prevent you from having lung cancer treatment.  Clinical breast exam.** / Every year after age 48 years.  BRCA-related cancer risk assessment.** / For women who have family members with a BRCA-related cancer (breast, ovarian, tubal, or peritoneal cancers).  Mammogram.** / Every year beginning at age 41 years and continuing for as long as you are in good health. Consult with your health care provider.  Pap test.** / Every 3 years starting at age 65 years through age 37 or 70 years with a history of 3 consecutive normal Pap tests.  HPV screening.** / Every 3 years from ages 72 years through ages 60 to 40 years with a history of 3 consecutive normal Pap tests.  Fecal occult blood test (FOBT) of stool. / Every year beginning at age 21 years and continuing until age 5 years. You may not need to do this test if you get  a colonoscopy every 10 years.  Flexible sigmoidoscopy or colonoscopy.** / Every 5 years for a flexible sigmoidoscopy or every 10 years for a colonoscopy beginning at age 35 years and continuing until age 48 years.  Hepatitis C blood test.** / For all people born from [birth year] through 1965 and any individual with known risks for hepatitis C.  Skin self-exam. / Monthly.  Influenza vaccine. / Every year.  Tetanus, diphtheria, and acellular pertussis (Tdap/Td) vaccine.** / Consult your health care provider. Pregnant women should receive 1 dose of Tdap vaccine during each pregnancy. 1 dose of Td every 10 years.  Varicella vaccine.** / Consult your health care provider. Pregnant females who do not have evidence of immunity should receive the first dose after pregnancy.  Zoster vaccine.** / 1 dose for adults aged 30 years or older.  Measles, mumps, rubella (MMR) vaccine.** / You need at least 1 dose of MMR if you were born in 1957 or later. You may also need a second dose. For females of childbearing age, rubella immunity should be determined. If there is no evidence of immunity, females who are not pregnant should be vaccinated. If there is no evidence of immunity, females who are pregnant should delay immunization until after pregnancy.  Pneumococcal 13-valent conjugate (PCV13) vaccine.** / Consult your health care provider.  Pneumococcal polysaccharide (PPSV23) vaccine.** / 1 to 2 doses if you smoke cigarettes or if you have certain conditions.  Meningococcal vaccine.** /  Consult your health care provider.  Hepatitis A vaccine.** / Consult your health care provider.  Hepatitis B vaccine.** / Consult your health care provider.  Haemophilus influenzae type b (Hib) vaccine.** / Consult your health care provider. Ages 64 years and over  Blood pressure check.** / Every year.  Lipid and cholesterol check.** / Every 5 years beginning at age 23 years.  Lung cancer screening. / Every year if you  are aged 16-80 years and have a 30-pack-year history of smoking and currently smoke or have quit within the past 15 years. Yearly screening is stopped once you have quit smoking for at least 15 years or develop a health problem that would prevent you from having lung cancer treatment.  Clinical breast exam.** / Every year after age 74 years.  BRCA-related cancer risk assessment.** / For women who have family members with a BRCA-related cancer (breast, ovarian, tubal, or peritoneal cancers).  Mammogram.** / Every year beginning at age 44 years and continuing for as long as you are in good health. Consult with your health care provider.  Pap test.** / Every 3 years starting at age 58 years through age 22 or 39 years with 3 consecutive normal Pap tests. Testing can be stopped between 65 and 70 years with 3 consecutive normal Pap tests and no abnormal Pap or HPV tests in the past 10 years.  HPV screening.** / Every 3 years from ages 64 years through ages 70 or 61 years with a history of 3 consecutive normal Pap tests. Testing can be stopped between 65 and 70 years with 3 consecutive normal Pap tests and no abnormal Pap or HPV tests in the past 10 years.  Fecal occult blood test (FOBT) of stool. / Every year beginning at age 40 years and continuing until age 27 years. You may not need to do this test if you get a colonoscopy every 10 years.  Flexible sigmoidoscopy or colonoscopy.** / Every 5 years for a flexible sigmoidoscopy or every 10 years for a colonoscopy beginning at age 7 years and continuing until age 32 years.  Hepatitis C blood test.** / For all people born from [birth year] through 1964 and any individual with known risks for hepatitis C.  Osteoporosis screening.** / A one-time screening for women ages 30 years and over and women at risk for fractures or osteoporosis.  Skin self-exam. / Monthly.  Influenza vaccine. / Every year.  Tetanus, diphtheria, and acellular pertussis (Tdap/Td)  vaccine.** / 1 dose of Td every 10 years.  Varicella vaccine.** / Consult your health care provider.  Zoster vaccine.** / 1 dose for adults aged 35 years or older.  Pneumococcal 13-valent conjugate (PCV13) vaccine.** / Consult your health care provider.  Pneumococcal polysaccharide (PPSV23) vaccine.** / 1 dose for all adults aged 46 years and older.  Meningococcal vaccine.** / Consult your health care provider.  Hepatitis A vaccine.** / Consult your health care provider.  Hepatitis B vaccine.** / Consult your health care provider.  Haemophilus influenzae type b (Hib) vaccine.** / Consult your health care provider. ** Family history and personal history of risk and conditions may change your health care provider's recommendations.   This information is not intended to replace advice given to you by your health care provider. Make sure you discuss any questions you have with your health care provider.   Document Released: 05/20/2001 Document Revised: 04/14/2014 Document Reviewed: 08/19/2010 Elsevier Interactive Patient Education [employer].

## 2015-12-20 NOTE — Progress Notes (Signed)
Pre visit review using our clinic review tool, if applicable. No additional management support is needed unless otherwise documented below in the visit note. 

## 2015-12-24 DIAGNOSIS — O4403 Placenta previa specified as without hemorrhage, third trimester: Secondary | ICD-10-CM | POA: Diagnosis not present

## 2015-12-24 DIAGNOSIS — D509 Iron deficiency anemia, unspecified: Secondary | ICD-10-CM | POA: Diagnosis not present

## 2015-12-24 DIAGNOSIS — Z3A27 27 weeks gestation of pregnancy: Secondary | ICD-10-CM | POA: Diagnosis not present

## 2015-12-24 DIAGNOSIS — Z23 Encounter for immunization: Secondary | ICD-10-CM | POA: Diagnosis not present

## 2015-12-24 DIAGNOSIS — Z3400 Encounter for supervision of normal first pregnancy, unspecified trimester: Secondary | ICD-10-CM | POA: Diagnosis not present

## 2015-12-24 DIAGNOSIS — O4402 Placenta previa specified as without hemorrhage, second trimester: Secondary | ICD-10-CM | POA: Diagnosis not present

## 2016-01-23 DIAGNOSIS — Z3400 Encounter for supervision of normal first pregnancy, unspecified trimester: Secondary | ICD-10-CM | POA: Diagnosis not present

## 2016-01-23 DIAGNOSIS — Z3480 Encounter for supervision of other normal pregnancy, unspecified trimester: Secondary | ICD-10-CM | POA: Diagnosis not present

## 2016-02-06 DIAGNOSIS — Z3400 Encounter for supervision of normal first pregnancy, unspecified trimester: Secondary | ICD-10-CM | POA: Diagnosis not present

## 2016-02-06 DIAGNOSIS — D509 Iron deficiency anemia, unspecified: Secondary | ICD-10-CM | POA: Diagnosis not present

## 2016-02-06 LAB — OB RESULTS CONSOLE GBS: STREP GROUP B AG: NEGATIVE

## 2016-02-19 DIAGNOSIS — Z3685 Encounter for antenatal screening for Streptococcus B: Secondary | ICD-10-CM | POA: Diagnosis not present

## 2016-03-16 ENCOUNTER — Inpatient Hospital Stay (HOSPITAL_COMMUNITY): Admission: AD | Admit: 2016-03-16 | Payer: 59 | Source: Ambulatory Visit | Admitting: Obstetrics & Gynecology

## 2016-03-19 ENCOUNTER — Telehealth (HOSPITAL_COMMUNITY): Payer: Self-pay | Admitting: *Deleted

## 2016-03-19 ENCOUNTER — Encounter (HOSPITAL_COMMUNITY): Payer: Self-pay | Admitting: *Deleted

## 2016-03-19 DIAGNOSIS — D509 Iron deficiency anemia, unspecified: Secondary | ICD-10-CM | POA: Diagnosis not present

## 2016-03-19 DIAGNOSIS — Z3400 Encounter for supervision of normal first pregnancy, unspecified trimester: Secondary | ICD-10-CM | POA: Diagnosis not present

## 2016-03-19 NOTE — Telephone Encounter (Signed)
Preadmission screen  

## 2016-03-20 ENCOUNTER — Encounter (HOSPITAL_COMMUNITY): Payer: Self-pay | Admitting: *Deleted

## 2016-03-25 ENCOUNTER — Inpatient Hospital Stay (HOSPITAL_COMMUNITY): Payer: 59 | Admitting: Anesthesiology

## 2016-03-25 ENCOUNTER — Encounter (HOSPITAL_COMMUNITY): Payer: Self-pay

## 2016-03-25 ENCOUNTER — Inpatient Hospital Stay (HOSPITAL_COMMUNITY)
Admission: RE | Admit: 2016-03-25 | Discharge: 2016-03-27 | DRG: 775 | Disposition: A | Discharge status: Home / Self Care | Payer: 59 | Source: Ambulatory Visit | Attending: Obstetrics and Gynecology | Admitting: Obstetrics and Gynecology

## 2016-03-25 DIAGNOSIS — K219 Gastro-esophageal reflux disease without esophagitis: Secondary | ICD-10-CM | POA: Diagnosis present

## 2016-03-25 DIAGNOSIS — Z6837 Body mass index (BMI) 37.0-37.9, adult: Secondary | ICD-10-CM | POA: Diagnosis not present

## 2016-03-25 DIAGNOSIS — O99214 Obesity complicating childbirth: Secondary | ICD-10-CM | POA: Diagnosis present

## 2016-03-25 DIAGNOSIS — Z3A40 40 weeks gestation of pregnancy: Secondary | ICD-10-CM | POA: Diagnosis not present

## 2016-03-25 DIAGNOSIS — O9962 Diseases of the digestive system complicating childbirth: Secondary | ICD-10-CM | POA: Diagnosis present

## 2016-03-25 DIAGNOSIS — O480 Post-term pregnancy: Secondary | ICD-10-CM | POA: Diagnosis present

## 2016-03-25 DIAGNOSIS — Z87891 Personal history of nicotine dependence: Secondary | ICD-10-CM | POA: Diagnosis not present

## 2016-03-25 DIAGNOSIS — Z3A00 Weeks of gestation of pregnancy not specified: Secondary | ICD-10-CM | POA: Diagnosis not present

## 2016-03-25 LAB — CBC
HCT: 31 % — ABNORMAL LOW (ref 36.0–46.0)
Hemoglobin: 10.6 g/dL — ABNORMAL LOW (ref 12.0–15.0)
MCH: 33 pg (ref 26.0–34.0)
MCHC: 34.2 g/dL (ref 30.0–36.0)
MCV: 96.6 fL (ref 78.0–100.0)
PLATELETS: 324 10*3/uL (ref 150–400)
RBC: 3.21 MIL/uL — ABNORMAL LOW (ref 3.87–5.11)
RDW: 14.1 % (ref 11.5–15.5)
WBC: 11.2 10*3/uL — AB (ref 4.0–10.5)

## 2016-03-25 LAB — TYPE AND SCREEN
ABO/RH(D): B POS
ANTIBODY SCREEN: NEGATIVE

## 2016-03-25 LAB — RPR: RPR Ser Ql: NONREACTIVE

## 2016-03-25 LAB — OB RESULTS CONSOLE ABO/RH

## 2016-03-25 LAB — ABO/RH: ABO/RH(D): B POS

## 2016-03-25 MED ORDER — DIPHENHYDRAMINE HCL 50 MG/ML IJ SOLN: 12.5000 mg | INTRAMUSCULAR | Status: DC | PRN

## 2016-03-25 MED ORDER — TERBUTALINE SULFATE 1 MG/ML IJ SOLN
0.2500 mg | Freq: Once | INTRAMUSCULAR | Status: DC | PRN
  Filled 2016-03-25: qty 1

## 2016-03-25 MED ORDER — LACTATED RINGERS IV SOLN
INTRAVENOUS | Status: DC
  Administered 2016-03-25 (×3): via INTRAVENOUS

## 2016-03-25 MED ORDER — LIDOCAINE HCL (PF) 1 % IJ SOLN
INTRAMUSCULAR | Status: DC | PRN
  Administered 2016-03-25 (×2): 5 mL via EPIDURAL

## 2016-03-25 MED ORDER — ONDANSETRON HCL 4 MG/2ML IJ SOLN: 4.0000 mg | INTRAMUSCULAR | Status: DC | PRN

## 2016-03-25 MED ORDER — EPHEDRINE 5 MG/ML INJ
10.0000 mg | INTRAVENOUS | Status: DC | PRN
  Filled 2016-03-25: qty 4

## 2016-03-25 MED ORDER — PRENATAL MULTIVITAMIN CH
1.0000 | ORAL_TABLET | Freq: Every day | ORAL | Status: DC
  Administered 2016-03-26: 1 via ORAL
  Filled 2016-03-25: qty 1

## 2016-03-25 MED ORDER — ACETAMINOPHEN 325 MG PO TABS
650.0000 mg | ORAL_TABLET | ORAL | Status: DC | PRN
Start: 2016-03-25 — End: 2016-03-27

## 2016-03-25 MED ORDER — PHENYLEPHRINE 40 MCG/ML (10ML) SYRINGE FOR IV PUSH (FOR BLOOD PRESSURE SUPPORT)
PREFILLED_SYRINGE | INTRAVENOUS | Status: AC
  Filled 2016-03-25: qty 20

## 2016-03-25 MED ORDER — SOD CITRATE-CITRIC ACID 500-334 MG/5ML PO SOLN
30.0000 mL | ORAL | Status: DC | PRN
  Filled 2016-03-25: qty 15

## 2016-03-25 MED ORDER — OXYCODONE-ACETAMINOPHEN 5-325 MG PO TABS: 1.0000 | ORAL_TABLET | ORAL | Status: DC | PRN

## 2016-03-25 MED ORDER — SIMETHICONE 80 MG PO CHEW: 80.0000 mg | CHEWABLE_TABLET | ORAL | Status: DC | PRN

## 2016-03-25 MED ORDER — ACETAMINOPHEN 325 MG PO TABS: 650.0000 mg | ORAL_TABLET | ORAL | Status: DC | PRN

## 2016-03-25 MED ORDER — LACTATED RINGERS IV SOLN
500.0000 mL | INTRAVENOUS | Status: DC | PRN
  Administered 2016-03-25: 500 mL via INTRAVENOUS

## 2016-03-25 MED ORDER — SENNOSIDES-DOCUSATE SODIUM 8.6-50 MG PO TABS
2.0000 | ORAL_TABLET | ORAL | Status: DC
  Administered 2016-03-26 (×2): 2 via ORAL
  Filled 2016-03-25 (×2): qty 2

## 2016-03-25 MED ORDER — TETANUS-DIPHTH-ACELL PERTUSSIS 5-2.5-18.5 LF-MCG/0.5 IM SUSP: 0.5000 mL | Freq: Once | INTRAMUSCULAR | Status: DC

## 2016-03-25 MED ORDER — LACTATED RINGERS IV SOLN: 500.0000 mL | Freq: Once | INTRAVENOUS | Status: DC

## 2016-03-25 MED ORDER — LIDOCAINE HCL (PF) 1 % IJ SOLN
30.0000 mL | INTRAMUSCULAR | Status: DC | PRN
  Filled 2016-03-25: qty 30

## 2016-03-25 MED ORDER — OXYTOCIN BOLUS FROM INFUSION
500.0000 mL | Freq: Once | INTRAVENOUS | Status: AC
  Administered 2016-03-25: 500 mL via INTRAVENOUS

## 2016-03-25 MED ORDER — WITCH HAZEL-GLYCERIN EX PADS: 1.0000 "application " | MEDICATED_PAD | CUTANEOUS | Status: DC | PRN

## 2016-03-25 MED ORDER — MEDROXYPROGESTERONE ACETATE 150 MG/ML IM SUSP: 150.0000 mg | INTRAMUSCULAR | Status: DC | PRN

## 2016-03-25 MED ORDER — BENZOCAINE-MENTHOL 20-0.5 % EX AERO
1.0000 "application " | INHALATION_SPRAY | CUTANEOUS | Status: DC | PRN
  Administered 2016-03-25: 1 via TOPICAL
  Filled 2016-03-25: qty 56

## 2016-03-25 MED ORDER — COCONUT OIL OIL: 1.0000 "application " | TOPICAL_OIL | Status: DC | PRN

## 2016-03-25 MED ORDER — OXYTOCIN 40 UNITS IN LACTATED RINGERS INFUSION - SIMPLE MED: 2.5000 [IU]/h | INTRAVENOUS | Status: DC

## 2016-03-25 MED ORDER — LACTATED RINGERS IV SOLN
INTRAVENOUS | Status: DC
  Administered 2016-03-25: 13:00:00 via INTRAUTERINE

## 2016-03-25 MED ORDER — OXYTOCIN 40 UNITS IN LACTATED RINGERS INFUSION - SIMPLE MED
1.0000 m[IU]/min | INTRAVENOUS | Status: DC
  Administered 2016-03-25: 2 m[IU]/min via INTRAVENOUS
  Filled 2016-03-25: qty 1000

## 2016-03-25 MED ORDER — PHENYLEPHRINE 40 MCG/ML (10ML) SYRINGE FOR IV PUSH (FOR BLOOD PRESSURE SUPPORT)
80.0000 ug | PREFILLED_SYRINGE | INTRAVENOUS | Status: DC | PRN
  Filled 2016-03-25: qty 5

## 2016-03-25 MED ORDER — MEASLES, MUMPS & RUBELLA VAC ~~LOC~~ INJ: 0.5000 mL | INJECTION | Freq: Once | SUBCUTANEOUS | Status: DC

## 2016-03-25 MED ORDER — PHENYLEPHRINE 40 MCG/ML (10ML) SYRINGE FOR IV PUSH (FOR BLOOD PRESSURE SUPPORT)
80.0000 ug | PREFILLED_SYRINGE | INTRAVENOUS | Status: DC | PRN
Start: 2016-03-25 — End: 2016-03-25
  Filled 2016-03-25: qty 5

## 2016-03-25 MED ORDER — ONDANSETRON HCL 4 MG/2ML IJ SOLN
4.0000 mg | Freq: Four times a day (QID) | INTRAMUSCULAR | Status: DC | PRN
  Administered 2016-03-25: 4 mg via INTRAVENOUS
  Filled 2016-03-25: qty 2

## 2016-03-25 MED ORDER — OXYCODONE-ACETAMINOPHEN 5-325 MG PO TABS: 2.0000 | ORAL_TABLET | ORAL | Status: DC | PRN

## 2016-03-25 MED ORDER — FENTANYL 2.5 MCG/ML BUPIVACAINE 1/10 % EPIDURAL INFUSION (WH - ANES)
INTRAMUSCULAR | Status: AC
  Filled 2016-03-25: qty 100

## 2016-03-25 MED ORDER — DIPHENHYDRAMINE HCL 25 MG PO CAPS: 25.0000 mg | ORAL_CAPSULE | Freq: Four times a day (QID) | ORAL | Status: DC | PRN

## 2016-03-25 MED ORDER — FENTANYL 2.5 MCG/ML BUPIVACAINE 1/10 % EPIDURAL INFUSION (WH - ANES)
14.0000 mL/h | INTRAMUSCULAR | Status: DC | PRN
  Administered 2016-03-25: 12 mL/h via EPIDURAL

## 2016-03-25 MED ORDER — MISOPROSTOL 25 MCG QUARTER TABLET
25.0000 ug | ORAL_TABLET | ORAL | Status: DC | PRN
  Administered 2016-03-25 (×2): 25 ug via VAGINAL
  Filled 2016-03-25: qty 0.25
  Filled 2016-03-25: qty 1
  Filled 2016-03-25: qty 0.25

## 2016-03-25 MED ORDER — ONDANSETRON HCL 4 MG PO TABS
4.0000 mg | ORAL_TABLET | ORAL | Status: DC | PRN
Start: 2016-03-25 — End: 2016-03-27

## 2016-03-25 MED ORDER — BUTORPHANOL TARTRATE 1 MG/ML IJ SOLN
1.0000 mg | INTRAMUSCULAR | Status: DC | PRN
  Administered 2016-03-25: 1 mg via INTRAVENOUS
  Filled 2016-03-25: qty 1

## 2016-03-25 MED ORDER — DIBUCAINE 1 % RE OINT: 1.0000 "application " | TOPICAL_OINTMENT | RECTAL | Status: DC | PRN

## 2016-03-25 MED ORDER — IBUPROFEN 600 MG PO TABS
600.0000 mg | ORAL_TABLET | Freq: Four times a day (QID) | ORAL | Status: DC
  Administered 2016-03-25 – 2016-03-26 (×6): 600 mg via ORAL
  Filled 2016-03-25 (×7): qty 1

## 2016-03-25 NOTE — Anesthesia Procedure Notes (Signed)
Epidural Patient location during procedure: OB Start time: 03/25/2016 10:06 AM End time: 03/25/2016 10:21 AM  Staffing Anesthesiologist: Annye Asa Performed: anesthesiologist   Preanesthetic Checklist Completed: patient identified, surgical consent, pre-op evaluation, timeout performed, IV checked, risks and benefits discussed and monitors and equipment checked  Epidural Patient position: sitting Prep: site prepped and draped and DuraPrep Patient monitoring: blood pressure, continuous pulse ox and heart rate Approach: midline Location: L1-L2 Injection technique: LOR air  Needle:  Needle type: Tuohy  Needle gauge: 17 G Needle length: 9 cm Needle insertion depth: 5.5 cm Catheter type: closed end flexible Catheter size: 19 Gauge Catheter at skin depth: 11 cm Test dose: negative (1% lidocaine)  Assessment Events: blood not aspirated, injection not painful, no injection resistance, negative IV test and no paresthesia  Additional Notes Pt identified in Labor room.  Monitors applied. Working IV access confirmed. Sterile prep, drape lumbar spine.  1% lido local L 2,3.  #17ga Touhy LOR air at 5.5 cm L 2,3, cath in easily to 11 cm skin. Test dose OK, cath dosed and infusion begun.  Patient asymptomatic, VSS, no heme aspirated, tolerated well.  Jenita Seashore, MDReason for block:procedure for pain

## 2016-03-25 NOTE — H&P (Signed)
Marissa Crawford is a 35 y.o. female presenting for postdates IOL.   OB History    Gravida Para Term Preterm AB Living   1             SAB TAB Ectopic Multiple Live Births                 History reviewed. No pertinent past medical history. History reviewed. No pertinent surgical history. Family History: family history includes Alcohol abuse in her father; Cancer (age of onset: 58) in her mother; Cervical cancer in her mother; Diabetes in her brother, maternal grandfather, maternal grandmother, paternal grandfather, and paternal grandmother; Gout in her father; Hypertension in her father; Seizures in her sister; Stroke in her maternal grandfather and paternal grandmother. Social History:  reports that she quit smoking about 7 months ago. Her smoking use included Cigarettes. She has a 11.00 pack-year smoking history. She has never used smokeless tobacco. She reports that she drinks about 1.0 oz of alcohol per week . She reports that she does not use drugs.     Maternal Diabetes: No Genetic Screening: Declined Maternal Ultrasounds/Referrals: Normal Fetal Ultrasounds or other Referrals:  None Maternal Substance Abuse:  No Significant Maternal Medications:  None Significant Maternal Lab Results:  None Other Comments:  None  ROS History Dilation: 1 Effacement (%): 50 Station: -2 Exam by:: S Moyer Blood pressure (!) 150/68, pulse 86, temperature 98.2 F (36.8 C), temperature source Oral, resp. rate 18, height 5\' 2"  (1.575 m), weight 205 lb (93 kg), last menstrual period 06/27/2015. Exam Physical Exam  Gen - NAD Abd - gravid, NT  EFW 7.5# Ext - NT, no edema  Prenatal labs: ABO, Rh: --/--/B POS, B POS (12/19 0057) Antibody: NEG (12/19 0057) Rubella: Immune (05/19 0000) RPR: Nonreactive (05/19 0000)  HBsAg: Negative (05/19 0000)  HIV: Non-reactive (05/19 0000)  GBS:   neg  Assessment/Plan: Admit cytotec IOL AROM when able   Marissa Crawford 03/25/2016, 7:55 AM

## 2016-03-25 NOTE — Progress Notes (Signed)
Pt comfortable w/ epidural  FHT good variability but occ variable & occ late decel Toco Q3 Cvx 4-5/C/-1 IUPC placed  A/P:  Will start amnioinfusion Exp mngt

## 2016-03-25 NOTE — Progress Notes (Signed)
Pt comfortable  FHT cat 1 Toco Q1-3 Cvx 8/C/0  A/P:  Exp mngt

## 2016-03-25 NOTE — Anesthesia Preprocedure Evaluation (Signed)
Anesthesia Evaluation  Patient identified by MRN, date of birth, ID band Patient awake    Reviewed: Allergy & Precautions, NPO status , Patient's Chart, lab work & pertinent test results  History of Anesthesia Complications Negative for: history of anesthetic complications  Airway Mallampati: III  TM Distance: >3 FB Neck ROM: Full    Dental  (+) Dental Advisory Given   Pulmonary Current Smoker,    breath sounds clear to auscultation       Cardiovascular negative cardio ROS   Rhythm:Regular Rate:Normal     Neuro/Psych negative neurological ROS     GI/Hepatic Neg liver ROS, GERD  Poorly Controlled,  Endo/Other  Morbid obesity  Renal/GU negative Renal ROS     Musculoskeletal negative musculoskeletal ROS (+)   Abdominal (+) + obese,   Peds  Hematology negative hematology ROS (+) plt 324k   Anesthesia Other Findings   Reproductive/Obstetrics (+) Pregnancy                             Anesthesia Physical Anesthesia Plan  ASA: II  Anesthesia Plan: Epidural   Post-op Pain Management:    Induction:   Airway Management Planned: Natural Airway  Additional Equipment:   Intra-op Plan:   Post-operative Plan:   Informed Consent: I have reviewed the patients History and Physical, chart, labs and discussed the procedure including the risks, benefits and alternatives for the proposed anesthesia with the patient or authorized representative who has indicated his/her understanding and acceptance.     Plan Discussed with:   Anesthesia Plan Comments: (Patient identified. Risks/Benefits/Options discussed with patient including but not limited to bleeding, infection, nerve damage, paralysis, failed block, incomplete pain control, headache, blood pressure changes, nausea, vomiting, reactions to medication both or allergic, itching and postpartum back pain. Confirmed with bedside nurse the  patient's most recent platelet count. Confirmed with patient that they are not currently taking any anticoagulation, have any bleeding history or any family history of bleeding disorders. Patient expressed understanding and wished to proceed. All questions were answered. )        Anesthesia Quick Evaluation

## 2016-03-25 NOTE — Progress Notes (Signed)
SVD of female infant w/ apgars of 6,7,9.  Placenta delivered spontaneous w/ 3VC.   1st degree lac repaired w/ 3-0 vicryl rapide.  Fundus firm.  EBL 300cc .

## 2016-03-25 NOTE — Anesthesia Pain Management Evaluation Note (Signed)
  CRNA Pain Management Visit Note  Patient: Marissa Crawford, 35 y.o., female  "Hello I am a member of the anesthesia team at [facility]. We have an anesthesia team available at all times to provide care throughout the hospital, including epidural management and anesthesia for C-section. I don't know your plan for the delivery whether it a natural birth, water birth, IV sedation, nitrous supplementation, doula or epidural, but we want to meet your pain goals."   1.Was your pain managed to your expectations on prior hospitalizations?   No prior hospitalizations  2.What is your expectation for pain management during this hospitalization?     Epidural  3.How can we help you reach that goal? epidural  Record the patient's initial score and the patient's pain goal.   Pain: 8  Pain Goal: 4 [facility] wants you to be able to say your pain was always managed very well.  Rozlyn Yerby 03/25/2016

## 2016-03-25 NOTE — Progress Notes (Deleted)
Pt comfortable w/ epidural  FHT cat 1 Toco Q2 Cvx 7-8/C/-1  A/P:  Exp mngt

## 2016-03-25 NOTE — Progress Notes (Signed)
Pt getting uncomfortable w/ ctx.  S/p stadol x 1  FHT cat 1 Toco irregular Cvx 3/90/-2 Attempted AROM - no fluid return  A/P:  Epidural prn Start pitocin

## 2016-03-25 NOTE — Anesthesia Postprocedure Evaluation (Signed)
Anesthesia Post Note  Patient: Marissa Crawford  Procedure(s) Performed: * No procedures listed *  Patient location during evaluation: Mother Baby Anesthesia Type: Epidural Level of consciousness: awake and alert Pain management: satisfactory to patient Vital Signs Assessment: post-procedure vital signs reviewed and stable Respiratory status: respiratory function stable Cardiovascular status: stable Postop Assessment: no headache, no backache, epidural receding, patient able to bend at knees, no signs of nausea or vomiting and adequate PO intake Anesthetic complications: no        Last Vitals:  Vitals:   03/25/16 2000 03/25/16 2100  BP: 133/68 140/84  Pulse: 83 65  Resp: 20 20  Temp: 37.9 C 37 C    Last Pain:  Vitals:   03/25/16 2100  TempSrc:   PainSc: 0-No pain   Pain Goal: Patients Stated Pain Goal: 2 (03/25/16 2000)               Katherina Mires

## 2016-03-26 LAB — CBC
HCT: 26.6 % — ABNORMAL LOW (ref 36.0–46.0)
Hemoglobin: 9.3 g/dL — ABNORMAL LOW (ref 12.0–15.0)
MCH: 33.2 pg (ref 26.0–34.0)
MCHC: 35 g/dL (ref 30.0–36.0)
MCV: 95 fL (ref 78.0–100.0)
PLATELETS: 283 10*3/uL (ref 150–400)
RBC: 2.8 MIL/uL — ABNORMAL LOW (ref 3.87–5.11)
RDW: 14.8 % (ref 11.5–15.5)
WBC: 18.2 10*3/uL — ABNORMAL HIGH (ref 4.0–10.5)

## 2016-03-26 NOTE — Lactation Note (Signed)
This note was copied from a baby's chart. Lactation Consultation Note  Patient Name: Marissa Crawford [id number] Date: 03/26/2016 Reason for consult: Initial assessment Baby is 67 hours old and has been to the breast several times.  @ this consult reviewed hand expressing obtained 1 ml off the breast.  And spoon fed back to baby . Baby tolerated well.  LC assisted with latch and challenged baby to open her mouth by tickling upper lip. Baby was able to get over nipple to areola and sustain latch for 3 mins.  Nipple elongated and rounded when baby released.  LC also worked depth , and positioning. Per mom comfortable with latch. LC discussed due to the baby being less than 6 pounds and term to feed with feeding cuee And to offer breast at least 3 hours. Also since the baby  has been to the breast several times  For now we can hold off on set up of DEBP .  1 void, 3 stools ( LC had changed the 3rd stool mec / green diaper. BF 30,11,15 , LS 5-6. Mom aware to do STS with feedings and in between . And also hand express between feedings , save milk. Mother informed of post-discharge support and given phone number to the lactation department, including services for phone call assistance; out-patient appointments; and breastfeeding support group. List of other breastfeeding resources in the community given in the handout. Encouraged mother to call for problems or concerns related to breastfeeding.  Maternal Data Has patient been taught Hand Expression?: Yes (expressed easily , 1 ml ) Does the patient have breastfeeding experience prior to this delivery?: No  Feeding Feeding Type: Breast Milk Length of feed: 3 min  LATCH Score/Interventions Latch: Repeated attempts needed to sustain latch, nipple held in mouth throughout feeding, stimulation needed to elicit sucking reflex. Intervention(s): Skin to skin Intervention(s): Adjust position;Assist with latch;Breast massage;Breast compression  Audible  Swallowing: None  Type of Nipple: Everted at rest and after stimulation (areola base full for baby's small mouth )  Comfort (Breast/Nipple): Soft / non-tender     Hold (Positioning): Assistance needed to correctly position infant at breast and maintain latch. Intervention(s): Breastfeeding basics reviewed;Support Pillows;Position options;Skin to skin  LATCH Score: 6  Lactation Tools Discussed/Used WIC Program: No   Consult Status Consult Status: Follow-up Date: 03/27/16 Follow-up type: In-patient    [place] 03/26/2016, 10:40 AM

## 2016-03-26 NOTE — Progress Notes (Signed)
Post Partum Day 1 Subjective: no complaints, up ad lib, voiding, tolerating PO and + flatus  Objective: Blood pressure 137/66, pulse 87, temperature 99.3 F (37.4 C), resp. rate 20, height 5\' 2"  (1.575 m), weight 205 lb (93 kg), last menstrual period 06/27/2015, SpO2 100 %, unknown if currently breastfeeding.  Physical Exam:  General: alert and cooperative Lochia: appropriate Uterine Fundus: firm Incision: healing well DVT Evaluation: No evidence of DVT seen on physical exam. Negative Homan's sign. No cords or calf tenderness. Calf/Ankle edema is present.   Recent Labs  03/25/16 0057 03/26/16 0525  HGB 10.6* 9.3*  HCT 31.0* 26.6*    Assessment/Plan: Plan for discharge tomorrow and Breastfeeding   LOS: 1 day   Gurinder Toral G 03/26/2016, 8:23 AM

## 2016-03-27 NOTE — Lactation Note (Signed)
This note was copied from a baby's chart. Lactation Consultation Note  Patient Name: Marissa Crawford [id number] Date: 03/27/2016 Reason for consult: Follow-up assessment;Infant weight loss Baby is 9 hours old. 5% weight loss. LC reviewed and updated  Doc flow sheets.  @ this consult baby hungry and awake, stool diaper changed by mom. LC assisted with latch on the left ( football ) and the right  ( cross cradle )  Breast are full and warm, areola semi compressible on the left, reverse pressure used  To make the areola more compressible. Baby seemed to latch deeper on the right compared to the left and  Was able to soften breast tissue. Mom denies sore nipples, sore nipples and engorgement prevention and tx reviewed.  Mom has her DEBP from the healthy pregnancy program and LC reviewed set and the need to switch flanges if needed.  ( #24 vs #27 ). Per mom the Pedis suggested she ask the [facility] about supplementing due to <6 pounds.  LC discussed with mom options - since her milk is in and the baby is only at 5 % weight loss, and moms milk letdown  Happens quickly , baby sustains latch well and depth to feed the baby at the breast every feeding , watch for nutritive vs non - nutritive feeding  Patterns, and if the baby gets sluggish where the baby is hanging out , release latch, burp and see if she is finished. If there is a feeding and it has been 3 hours since the baby ate and she is to sluggish to latch, try and appetizer of EBM with spoon feeding. If that is not working, use a bottle by tickling the upper lip wait for wide open mouth and make sure the baby has FISH lips.  After appetizer try latching , if still no success, finish with at least 30 ml of EBM.  If the baby's weight is down or not stabilizing when returning for the Pedis appt. Switch to feeding on the 1st breast 20 mins , supplement and post pump Other breast to enhance the milk coming in.  Mom receptive to returning for [facility] O/P appt.  Wednesday 12/27 at 1pm , appt. Reminder given to mom. Mom aware where [facility] office is. Mother informed of post-discharge support and given phone number to the lactation department, including services for phone call assistance; out-patient appointments; and breastfeeding support group. List of other breastfeeding resources in the community given in the handout. Encouraged mother to call for problems or concerns related to breastfeeding.  Maternal Data Has patient been taught Hand Expression?: Yes  Feeding Feeding Type: Breast Fed Length of feed: 12 min  LATCH Score/Interventions Latch: Grasps breast easily, tongue down, lips flanged, rhythmical sucking. Intervention(s): Skin to skin;Teach feeding cues;Waking techniques Intervention(s): Adjust position;Assist with latch;Breast massage;Breast compression  Audible Swallowing: Spontaneous and intermittent  Type of Nipple: Everted at rest and after stimulation  Comfort (Breast/Nipple): Filling, red/small blisters or bruises, mild/mod discomfort  Problem noted: Filling;Mild/Moderate discomfort  Hold (Positioning): Assistance needed to correctly position infant at breast and maintain latch. Intervention(s): Breastfeeding basics reviewed;Support Pillows;Position options;Skin to skin  LATCH Score: 8  Lactation Tools Discussed/Used Tools: Shells;Pump Shell Type: Inverted Breast pump type: Manual Pump Review: Setup, frequency, and cleaning;Milk Storage Initiated by:: MAI  Date initiated:: 03/27/16   Consult Status Consult Status: Follow-up Date: 03/27/16 Follow-up type: [place] 03/27/2016, 10:00 AM

## 2016-03-27 NOTE — Discharge Summary (Signed)
Obstetric Discharge Summary Reason for Admission: induction of labor Prenatal Procedures: ultrasound Intrapartum Procedures: spontaneous vaginal delivery Postpartum Procedures: none Complications-Operative and Postpartum: 1 degree perineal laceration Hemoglobin  Date Value Ref Range Status  03/26/2016 9.3 (L) 12.0 - 15.0 g/dL Final   HCT  Date Value Ref Range Status  03/26/2016 26.6 (L) 36.0 - 46.0 % Final    Physical Exam:  General: alert and cooperative Lochia: appropriate Uterine Fundus: firm Incision: healing well DVT Evaluation: No evidence of DVT seen on physical exam. Negative Homan's sign. No cords or calf tenderness. Calf/Ankle edema is present.  Discharge Diagnoses: Term Pregnancy-delivered  Discharge Information: Date: 03/27/2016 Activity: pelvic rest Diet: routine Medications: PNV and Ibuprofen Condition: stable Instructions: refer to practice specific booklet Discharge to: home   Newborn Data: Live born female  Birth Weight: 5 lb 12.4 oz (2620 g) APGAR: 6, 7  Home with mother.  CURTIS,CAROL G 03/27/2016, 8:07 AM

## 2016-04-02 ENCOUNTER — Ambulatory Visit (HOSPITAL_COMMUNITY)
Admission: RE | Admit: 2016-04-02 | Discharge: 2016-04-02 | Disposition: A | Discharge status: Home / Self Care | Payer: 59 | Source: Ambulatory Visit | Attending: Obstetrics and Gynecology | Admitting: Obstetrics and Gynecology

## 2016-04-02 NOTE — Lactation Note (Signed)
Lactation Consult - 27 day old baby girl Marissa Crawford, Marissa Crawford , Marissa Crawford, Marissa Crawford and present for 1 pm LC O/P appt.  Per Marissa baby last fed at 11:30 am for 30 mins. ( so it has only been 1 1/2 hours since last feeding.  Dr. Hilaria Ota. At 2:30 pm at [facility]. And 1/5 for Smart start appt. Baby's last weight was on 12/23 Sat. = 5-10 oz.  Engorgement problems for the 1st day home, feeding , massage, pumping, and dad's help resolved it.  Baby has been feeding about every 3 hours days, evenings about every 2 and nights 1 -1 /2 hours.  Wets >5-6, stools >2-3 yellow seedy  Using the [place] with #27 Flange for engorgement when 1st went home and when needed.  Per Marissa tried giving her Expressed milk with a Avent bottle and she became spitty - so I went back to breast    Mother's reason for visit:  F/U from hospital  Visit Type:  Feeding assessment  Appointment Notes:  Term , 5% weight loss, milk in ,  Consult:  Initial Lactation Consultant:  Marissa Crawford  ________________________________________________________________________ Marissa Crawford Name:  Marissa Crawford Date of Birth:  03/25/2015 Pediatrician:  Dr. Elnita Maxwell  Gender:  female Gestational Age: [redacted]w[redacted]d (At Birth) Birth Weight:  5 lb 12.4 oz (2620 g) Weight at Discharge:  Weight: 5 lb 8 oz (2495 g)                  Date of Discharge:  03/27/2016      Filed Weights   03/25/16 1753 03/25/16 2330 03/27/16 0002  Weight: 5 lb 12.4 oz (2620 g) 5 lb 11.9 oz (2605 g) 5 lb 8 oz (2495 g)  Last weight taken from location outside of [facility]:  5- 10 oz 03/29/16      Location:Pediatrician's office Weight today:  5-15.9 oz ( above birth weight ) gained 5 oz   _______________________________________________________________________  Mother's Name: Marissa Crawford Type of delivery:  Vaginal Delivery  Breastfeeding Experience:  1st baby  Maternal Medical Conditions:  No risk for milk supply  Maternal Medications:  PNV    ________________________________________________________________________  Breastfeeding History (Post Discharge) - see above note  ________________________________________________________________________  Maternal Breast Assessment  Breast:  Full - few nodules lateral aspect of the left breast , none  Nipple:  Erect  - areola compressible ( great improvement from day of D/C )  Pain level:  0 Pain interventions:  Expressed breast milk  _______________________________________________________________________ Feeding Assessment/Evaluation - baby awake and alert   Initial feeding assessment:  Infant's oral assessment:   Short labial frenulum noted just above the gun line.  High palate, thin white coating on tongue, LC noted baby extend her tongue over gum line  Short distance and raise it also. Not above the corners of her mouth.  When LC checked the baby's mouth with gloves on it was after the feeding and baby  Wasn't into opening wide, and was alittle gaggy.    Positioning:  Football Left breast  LATCH documentation:  Latch:  2 = Grasps breast easily, tongue down, lips flanged, rhythmical sucking.  Audible swallowing:  2 = Spontaneous and intermittent  Type of nipple:  2 = Everted at rest and after stimulation  Comfort (Breast/Nipple):  1 = Filling, red/small blisters or bruises, mild/mod discomfort  Hold (Positioning):  1 = Assistance needed to correctly position infant at breast and maintain latch  LATCH score: 8  Attached assessment:  Shallow @ 1st ( LC flipped upper lip to flanged position and breast compressions depth achieved)   Lips flanged:  No.  Lips untucked:  Yes.    Suck assessment:  Nutritive  Tools: hand pump for home use  Instructed on use and cleaning of tool:  Yes.    Pre-feed weight:  2720 g , 5-15.9 oz  Post-feed weight: 2740 g , 6-0.6 oz  Amount transferred:  20 ml  Amount supplemented: none   Additional Feeding Assessment -   Infant's oral  assessment:  See above note   Positioning:  Cross cradle Right breast  LATCH documentation:  Latch:  2 = Grasps breast easily, tongue down, lips flanged, rhythmical sucking.  Audible swallowing:  2 = Spontaneous and intermittent  Type of nipple:  2 = Everted at rest and after stimulation  Comfort (Breast/Nipple):  1 = Filling, red/small blisters or bruises, mild/mod discomfort  Hold (Positioning):  1 = Assistance needed to correctly position infant at breast and maintain latch  LATCH score: 8   Attached assessment:  Deep  Lips flanged:  No. ( had to flip to flanged position   Lips untucked:  Yes.    Suck assessment:  Nutritive I Pre-feed weight:  2740 g , 6-0.6 oz  Post-feed weight:  2754 g , 6-1.1 oz  Amount transferred: 14 ml  Amount supplemented: none   Total amount pumped post feed: Marissa had her DEBP Medela with her - ran out of time to post  pump due to her Pedis appt. At 2:30 pm   Total amount transferred: 34 ml   ( baby had fed 1 - 1/2 hours prior to feeding at consult 30 mins -  And for the 2nd breast baby release on her own acting like she was full. Also had a small spit up.  Total supplement given: none   Lactation Impression:  Baby has gained above birth weight - 5-15 oz  Milk is establishing well Marissa is very motivated to breast feed and dad is very supportive  Both receptive to teaching at consult  See oral variance note above  See LC plan below   Lactation Plan of Care:  Praised Marissa for her breast feeding efforts and dad's support  Breast feeding reminders  Don't allow "Kamille " to nibble her way onto the breast  If dimpling noted in her cheeks depth hasn't been achieved ( use techniques to adjust latch at latch)  Release suction with pinky finger inside of the baby's cheek If breast are to full to start - hand express 20 ml off the 1st breast , latch soften 1st breast , and offer 2nd breast  Important - Protect establishing milk supply. Transitioning back  to work breast / bottle : @ 4 weeks plan on 1 bottle feeding a day medium based nipple ( Dr. Owens Shark, [place], [place])  When feeding Hollice Gong a bottle sit her upright, tickle upper lip and wait for her to open wide , latch on bottle and check for  Flanged lips.  When bottle is given for a feeding need to pump both breast for 15 -20 mins. - therefore protecting milk supply.  Consider attending the [facility] Breast feeding support group either Monday's at 7 pm or Tuesday at 11pm.

## 2016-09-03 ENCOUNTER — Telehealth (HOSPITAL_COMMUNITY): Payer: Self-pay | Admitting: Lactation Services

## 2016-09-03 NOTE — Telephone Encounter (Signed)
Mom called with questions about low supply in left breast. Mom reports baby has been favoring right breast for several months, and now left breast will only produce 1/2 ounce with pumping. However, right breast is producing over 6 ounces when mom pumps. 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 Mom reports that she has introduced solid foods, per pediatrician's instructions, and baby is continuing to gain weight. Praised mom for all her hard work, and enc her to keep pumping while away from baby. Enc mom to postpump a few times a day as desired to maintain a good supply and have extra in the freezer for backup, and to send with baby when she stays overnight at grandparents. Mom stated that she was grateful for the information and encouragement.

## 2016-12-23 ENCOUNTER — Ambulatory Visit (INDEPENDENT_AMBULATORY_CARE_PROVIDER_SITE_OTHER): Payer: 59 | Admitting: Family Medicine

## 2016-12-23 ENCOUNTER — Encounter: Payer: Self-pay | Admitting: Family Medicine

## 2016-12-23 VITALS — BP 128/92 | HR 73 | Temp 98.4°F | Ht 61.0 in | Wt 175.0 lb

## 2016-12-23 DIAGNOSIS — R030 Elevated blood-pressure reading, without diagnosis of hypertension: Secondary | ICD-10-CM | POA: Diagnosis not present

## 2016-12-23 DIAGNOSIS — I10 Essential (primary) hypertension: Secondary | ICD-10-CM

## 2016-12-23 DIAGNOSIS — R319 Hematuria, unspecified: Secondary | ICD-10-CM

## 2016-12-23 DIAGNOSIS — Z0000 Encounter for general adult medical examination without abnormal findings: Secondary | ICD-10-CM

## 2016-12-23 DIAGNOSIS — Z0001 Encounter for general adult medical examination with abnormal findings: Secondary | ICD-10-CM | POA: Diagnosis not present

## 2016-12-23 DIAGNOSIS — I1A0 Resistant hypertension: Secondary | ICD-10-CM

## 2016-12-23 HISTORY — DX: Resistant hypertension: I1A.0

## 2016-12-23 HISTORY — DX: Essential (primary) hypertension: I10

## 2016-12-23 LAB — POC URINALSYSI DIPSTICK (AUTOMATED)
BILIRUBIN UA: NEGATIVE
Glucose, UA: NEGATIVE
Ketones, UA: NEGATIVE
Leukocytes, UA: NEGATIVE
NITRITE UA: NEGATIVE
PH UA: 6 (ref 5.0–8.0)
Protein, UA: NEGATIVE
UROBILINOGEN UA: 0.2 U/dL

## 2016-12-23 LAB — LIPID PANEL
CHOL/HDL RATIO: 4
Cholesterol: 179 mg/dL (ref 0–200)
HDL: 47.9 mg/dL (ref >39.00)
LDL Cholesterol: 114 mg/dL — ABNORMAL HIGH (ref 0–99)
NonHDL: 130.6
Triglycerides: 84 mg/dL (ref 0.0–149.0)
VLDL: 16.8 mg/dL (ref 0.0–40.0)

## 2016-12-23 LAB — CBC WITH DIFFERENTIAL/PLATELET
BASOS PCT: 1 % (ref 0.0–3.0)
Basophils Absolute: 0.1 10*3/uL (ref 0.0–0.1)
EOS ABS: 0.2 10*3/uL (ref 0.0–0.7)
Eosinophils Relative: 3.1 % (ref 0.0–5.0)
HEMATOCRIT: 42 % (ref 36.0–46.0)
Hemoglobin: 13.8 g/dL (ref 12.0–15.0)
LYMPHS PCT: 43.5 % (ref 12.0–46.0)
Lymphs Abs: 2.9 10*3/uL (ref 0.7–4.0)
MCHC: 32.8 g/dL (ref 30.0–36.0)
MCV: 97.4 fl (ref 78.0–100.0)
Monocytes Absolute: 0.5 10*3/uL (ref 0.1–1.0)
Monocytes Relative: 7.5 % (ref 3.0–12.0)
NEUTROS ABS: 3 10*3/uL (ref 1.4–7.7)
Neutrophils Relative %: 44.9 % (ref 43.0–77.0)
PLATELETS: 281 10*3/uL (ref 150.0–400.0)
RBC: 4.31 Mil/uL (ref 3.87–5.11)
RDW: 14 % (ref 11.5–15.5)
WBC: 6.8 10*3/uL (ref 4.0–10.5)

## 2016-12-23 LAB — COMPREHENSIVE METABOLIC PANEL
ALK PHOS: 75 U/L (ref 39–117)
ALT: 10 U/L (ref 0–35)
AST: 15 U/L (ref 0–37)
Albumin: 4.1 g/dL (ref 3.5–5.2)
BILIRUBIN TOTAL: 0.3 mg/dL (ref 0.2–1.2)
BUN: 16 mg/dL (ref 6–23)
CALCIUM: 9.6 mg/dL (ref 8.4–10.5)
CO2: 26 mEq/L (ref 19–32)
CREATININE: 0.67 mg/dL (ref 0.40–1.20)
Chloride: 106 mEq/L (ref 96–112)
GFR: 128.67 mL/min (ref >60.00)
Glucose, Bld: 79 mg/dL (ref 70–99)
Potassium: 4 mEq/L (ref 3.5–5.1)
Sodium: 138 mEq/L (ref 135–145)
TOTAL PROTEIN: 7 g/dL (ref 6.0–8.3)

## 2016-12-23 LAB — TSH: TSH: 2.48 u[IU]/mL (ref 0.35–4.50)

## 2016-12-23 NOTE — Patient Instructions (Signed)
Preventive Care 18-39 Years, Female Preventive care refers to lifestyle choices and visits with your health care provider that can promote health and wellness. What does preventive care include?  A yearly physical exam. This is also called an annual well check.  Dental exams once or twice a year.  Routine eye exams. Ask your health care provider how often you should have your eyes checked.  Personal lifestyle choices, including: ? Daily care of your teeth and gums. ? Regular physical activity. ? Eating a healthy diet. ? Avoiding tobacco and drug use. ? Limiting alcohol use. ? Practicing safe sex. ? Taking vitamin and mineral supplements as recommended by your health care provider. What happens during an annual well check? The services and screenings done by your health care provider during your annual well check will depend on your age, overall health, lifestyle risk factors, and family history of disease. Counseling Your health care provider may ask you questions about your:  Alcohol use.  Tobacco use.  Drug use.  Emotional well-being.  Home and relationship well-being.  Sexual activity.  Eating habits.  Work and work Statistician.  Method of birth control.  Menstrual cycle.  Pregnancy history.  Screening You may have the following tests or measurements:  Height, weight, and BMI.  Diabetes screening. This is done by checking your blood sugar (glucose) after you have not eaten for a while (fasting).  Blood pressure.  Lipid and cholesterol levels. These may be checked every 5 years starting at age 66.  Skin check.  Hepatitis C blood test.  Hepatitis B blood test.  Sexually transmitted disease (STD) testing.  BRCA-related cancer screening. This may be done if you have a family history of breast, ovarian, tubal, or peritoneal cancers.  Pelvic exam and Pap test. This may be done every 3 years starting at age 40. Starting at age 59, this may be done every 5  years if you have a Pap test in combination with an HPV test.  Discuss your test results, treatment options, and if necessary, the need for more tests with your health care provider. Vaccines Your health care provider may recommend certain vaccines, such as:  Influenza vaccine. This is recommended every year.  Tetanus, diphtheria, and acellular pertussis (Tdap, Td) vaccine. You may need a Td booster every 10 years.  Varicella vaccine. You may need this if you have not been vaccinated.  HPV vaccine. If you are 69 or younger, you may need three doses over 6 months.  Measles, mumps, and rubella (MMR) vaccine. You may need at least one dose of MMR. You may also need a second dose.  Pneumococcal 13-valent conjugate (PCV13) vaccine. You may need this if you have certain conditions and were not previously vaccinated.  Pneumococcal polysaccharide (PPSV23) vaccine. You may need one or two doses if you smoke cigarettes or if you have certain conditions.  Meningococcal vaccine. One dose is recommended if you are age 27-21 years and a first-year college student living in a residence hall, or if you have one of several medical conditions. You may also need additional booster doses.  Hepatitis A vaccine. You may need this if you have certain conditions or if you travel or work in places where you may be exposed to hepatitis A.  Hepatitis B vaccine. You may need this if you have certain conditions or if you travel or work in places where you may be exposed to hepatitis B.  Haemophilus influenzae type b (Hib) vaccine. You may need this if  you have certain risk factors.  Talk to your health care provider about which screenings and vaccines you need and how often you need them. This information is not intended to replace advice given to you by your health care provider. Make sure you discuss any questions you have with your health care provider. Document Released: 05/20/2001 Document Revised: 12/12/2015  Document Reviewed: 01/23/2015 Elsevier Interactive Patient Education  2017 [employer].

## 2016-12-23 NOTE — Assessment & Plan Note (Signed)
Dash diet  Exercise  Pt will have checked at hospital  rto 3 months

## 2016-12-23 NOTE — Progress Notes (Signed)
Subjective:     Marissa Crawford is a 36 y.o. female and is here for a comprehensive physical exam. The patient reports no problems. Had a baby 9 months ago-- working on losing the [employer]  Social History   Social History  . Marital status: Single    Spouse name: N/A  . Number of children: N/A  . Years of education: N/A   Occupational History  . RN --Med surg [employer]   Social History Main Topics  . Smoking status: Former Smoker    Packs/day: 1.00    Years: 11.00    Types: Cigarettes    Quit date: 08/06/2015  . Smokeless tobacco: Never Used  . Alcohol use 1.0 oz/week    2 Standard drinks or equivalent per week  . Drug use: No  . Sexual activity: Yes    Partners: Male    Birth control/ protection: Condom, None   Other Topics Concern  . Not on file   Social History Narrative   Exercise ---cardio   Health Maintenance  Topic Date Due  . TETANUS/TDAP  04/08/2015  . INFLUENZA VACCINE  12/26/2016 (Originally 11/05/2016)  . PAP SMEAR  05/08/2017 (Originally 04/17/2016)  . HIV Screening  Completed    The following portions of the patient's history were reviewed and updated as appropriate:  She  has no past medical history on file. She  does not have any pertinent problems on file. She  has no past surgical history on file. Her family history includes Alcohol abuse in her father; Cancer (age of onset: 38) in her mother; Cervical cancer in her mother; Diabetes in her brother, maternal grandfather, maternal grandmother, paternal grandfather, and paternal grandmother; Gout in her father; Hypertension in her father; Seizures in her sister; Stroke in her maternal grandfather and paternal grandmother. She  reports that she quit smoking about 16 months ago. Her smoking use included Cigarettes. She has a 11.00 pack-year smoking history. She has never used smokeless tobacco. She reports that she drinks about 1.0 oz of alcohol per week . She reports that she does not use drugs. She has a  current medication list which includes the following prescription(s): norlyda and prenatal multivitamin. Current Outpatient Prescriptions on File Prior to Visit  Medication Sig Dispense Refill  . Prenatal Vit-Fe Fumarate-FA (PRENATAL MULTIVITAMIN) TABS tablet Take 1 tablet by mouth daily at 12 noon.     No current facility-administered medications on file prior to visit.    She has No Known Allergies..  Review of Systems Review of Systems  Constitutional: Negative for activity change, appetite change and fatigue.  HENT: Negative for hearing loss, congestion, tinnitus and ear discharge.  dentist q37m Eyes: Negative for visual disturbance (see optho q1y -- vision corrected to 20/20 with glasses).  Respiratory: Negative for cough, chest tightness and shortness of breath.   Cardiovascular: Negative for chest pain, palpitations and leg swelling.  Gastrointestinal: Negative for abdominal pain, diarrhea, constipation and abdominal distention.  Genitourinary: Negative for urgency, frequency, decreased urine volume and difficulty urinating.  Musculoskeletal: Negative for back pain, arthralgias and gait problem.  Skin: Negative for color change, pallor and rash.  Neurological: Negative for dizziness, light-headedness, numbness and headaches.  Hematological: Negative for adenopathy. Does not bruise/bleed easily.  Psychiatric/Behavioral: Negative for suicidal ideas, confusion, sleep disturbance, self-injury, dysphoric mood, decreased concentration and agitation.       Objective:    BP (!) 128/92 (BP Location: Right Arm, Patient Position: Sitting)   Pulse 73   Temp 98.4  F (36.9 C) (Oral)   Ht 5\' 1"  (1.549 m)   Wt 175 lb (79.4 kg)   SpO2 99%   Breastfeeding? Yes   BMI 33.07 kg/m  General appearance: alert, cooperative, appears stated age and no distress Head: Normocephalic, without obvious abnormality, atraumatic Eyes: conjunctivae/corneas clear. PERRL, EOM's intact. Fundi benign. Ears:  normal TM's and external ear canals both ears Nose: Nares normal. Septum midline. Mucosa normal. No drainage or sinus tenderness. Throat: lips, mucosa, and tongue normal; teeth and gums normal Neck: no adenopathy, no carotid bruit, no JVD, supple, symmetrical, trachea midline and thyroid not enlarged, symmetric, no tenderness/mass/nodules Back: symmetric, no curvature. ROM normal. No CVA tenderness. Lungs: clear to auscultation bilaterally Breasts: gyn Heart: regular rate and rhythm, S1, S2 normal, no murmur, click, rub or gallop Abdomen: soft, non-tender; bowel sounds normal; no masses,  no organomegaly Pelvic: deferred--gyn Extremities: extremities normal, atraumatic, no cyanosis or edema Pulses: 2+ and symmetric Skin: Skin color, texture, turgor normal. No rashes or lesions Lymph nodes: Cervical, supraclavicular, and axillary nodes normal. Neurologic: Alert and oriented X 3, normal strength and tone. Normal symmetric reflexes. Normal coordination and gait    Assessment:    Healthy female exam.      Plan:    ghm utd Check labs  See After Visit Summary for Counseling Recommendations    1. Preventative health care See above - CBC with Differential/Platelet - Lipid panel - TSH - Comprehensive metabolic panel - POCT Urinalysis Dipstick (Automated)  2. Elevated BP without diagnosis of hypertension Watch salt in die-- dash diet  Exercise Recheck 3 monthst   3. Hematuria, unspecified type  - Urine Culture

## 2016-12-24 LAB — URINE CULTURE
MICRO NUMBER: 81029716
RESULT: NO GROWTH
SPECIMEN QUALITY: ADEQUATE

## 2017-02-14 DIAGNOSIS — H52223 Regular astigmatism, bilateral: Secondary | ICD-10-CM | POA: Diagnosis not present

## 2017-02-14 DIAGNOSIS — H5213 Myopia, bilateral: Secondary | ICD-10-CM | POA: Diagnosis not present

## 2017-03-24 ENCOUNTER — Ambulatory Visit (INDEPENDENT_AMBULATORY_CARE_PROVIDER_SITE_OTHER): Payer: 59 | Admitting: Family Medicine

## 2017-03-24 VITALS — BP 128/89 | HR 78

## 2017-03-24 DIAGNOSIS — R030 Elevated blood-pressure reading, without diagnosis of hypertension: Secondary | ICD-10-CM

## 2017-03-24 NOTE — Progress Notes (Signed)
Pre visit review using our clinic tool,if applicable. No additional management support is needed unless otherwise documented below in the visit note.   Patient in for BP check per order from Dr. Carollee Herter  Last BP = 128/92  P=73  BP today = 128/89  P= 78  Per Dr. Carollee Herter patient to monitor slat intake and follow Dash diet plan. Return in 3 months for BP check. Patient to schedule at front desk.

## 2017-05-20 DIAGNOSIS — Z01419 Encounter for gynecological examination (general) (routine) without abnormal findings: Secondary | ICD-10-CM | POA: Diagnosis not present

## 2017-05-20 DIAGNOSIS — Z113 Encounter for screening for infections with a predominantly sexual mode of transmission: Secondary | ICD-10-CM | POA: Diagnosis not present

## 2017-05-20 DIAGNOSIS — Z6831 Body mass index (BMI) 31.0-31.9, adult: Secondary | ICD-10-CM | POA: Diagnosis not present

## 2017-06-23 ENCOUNTER — Ambulatory Visit: Payer: 59

## 2017-12-25 ENCOUNTER — Encounter: Payer: 59 | Admitting: Family Medicine

## 2018-02-03 ENCOUNTER — Encounter: Payer: Self-pay | Admitting: Family Medicine

## 2018-02-03 ENCOUNTER — Ambulatory Visit (INDEPENDENT_AMBULATORY_CARE_PROVIDER_SITE_OTHER): Payer: 59 | Admitting: Family Medicine

## 2018-02-03 VITALS — BP 122/88 | HR 112 | Temp 98.2°F | Ht 61.0 in | Wt 178.4 lb

## 2018-02-03 DIAGNOSIS — Z0000 Encounter for general adult medical examination without abnormal findings: Secondary | ICD-10-CM | POA: Diagnosis not present

## 2018-02-03 LAB — COMPREHENSIVE METABOLIC PANEL
ALBUMIN: 4.7 g/dL (ref 3.5–5.2)
ALK PHOS: 60 U/L (ref 39–117)
ALT: 23 U/L (ref 0–35)
AST: 31 U/L (ref 0–37)
BUN: 11 mg/dL (ref 6–23)
CALCIUM: 9.5 mg/dL (ref 8.4–10.5)
CHLORIDE: 104 meq/L (ref 96–112)
CO2: 24 mEq/L (ref 19–32)
CREATININE: 0.73 mg/dL (ref 0.40–1.20)
GFR: 115.81 mL/min (ref >60.00)
Glucose, Bld: 94 mg/dL (ref 70–99)
Potassium: 3.7 mEq/L (ref 3.5–5.1)
Sodium: 139 mEq/L (ref 135–145)
Total Bilirubin: 0.8 mg/dL (ref 0.2–1.2)
Total Protein: 7.3 g/dL (ref 6.0–8.3)

## 2018-02-03 LAB — CBC
HCT: 40.6 % (ref 36.0–46.0)
Hemoglobin: 14.1 g/dL (ref 12.0–15.0)
MCHC: 34.8 g/dL (ref 30.0–36.0)
MCV: 97.1 fl (ref 78.0–100.0)
PLATELETS: 297 10*3/uL (ref 150.0–400.0)
RBC: 4.19 Mil/uL (ref 3.87–5.11)
RDW: 13.6 % (ref 11.5–15.5)
WBC: 10.7 10*3/uL — ABNORMAL HIGH (ref 4.0–10.5)

## 2018-02-03 LAB — LIPID PANEL
CHOLESTEROL: 223 mg/dL — AB (ref 0–200)
HDL: 44.9 mg/dL (ref >39.00)
LDL Cholesterol: 157 mg/dL — ABNORMAL HIGH (ref 0–99)
NonHDL: 178.1
TRIGLYCERIDES: 104 mg/dL (ref 0.0–149.0)
Total CHOL/HDL Ratio: 5
VLDL: 20.8 mg/dL (ref 0.0–40.0)

## 2018-02-03 NOTE — Patient Instructions (Signed)
Give [place] 2-3 business days to get the results of your labs back.   Aim to do some physical exertion for 150 minutes per week. This is typically divided into 5 days per week, 30 minutes per day. The activity should be enough to get your heart rate up. Anything is better than nothing if you have time constraints.  Healthy Eating Plan Many factors influence your heart health, including eating and exercise habits. Heart (coronary) risk increases with abnormal blood fat (lipid) levels. Heart-healthy meal planning includes limiting unhealthy fats, increasing healthy fats, and making other small dietary changes. This includes maintaining a healthy body weight to help keep lipid levels within a normal range.  WHAT IS MY PLAN?  Your health care provider recommends that you:  Drink a glass of water before meals to help with satiety.  Eat slowly.  An alternative to the water is to add Metamucil. This will help with satiety as well. It does contain calories, unlike water.  WHAT TYPES OF FAT SHOULD I CHOOSE?  Choose healthy fats more often. Choose monounsaturated and polyunsaturated fats, such as olive oil and canola oil, flaxseeds, walnuts, almonds, and seeds.  Eat more omega-3 fats. Good choices include salmon, mackerel, sardines, tuna, flaxseed oil, and ground flaxseeds. Aim to eat fish at least two times each week.  Avoid foods with partially hydrogenated oils in them. These contain trans fats. Examples of foods that contain trans fats are stick margarine, some tub margarines, cookies, crackers, and other baked goods. If you are going to avoid a fat, this is the one to avoid!  WHAT GENERAL GUIDELINES DO I NEED TO FOLLOW?  Check food labels carefully to identify foods with trans fats. Avoid these types of options when possible.  Fill one half of your plate with vegetables and green salads. Eat 4-5 servings of vegetables per day. A serving of vegetables equals 1 cup of raw leafy vegetables,  cup of  raw or cooked cut-up vegetables, or  cup of vegetable juice.  Fill one fourth of your plate with whole grains. Look for the word "whole" as the first word in the ingredient list.  Fill one fourth of your plate with lean protein foods.  Eat 4-5 servings of fruit per day. A serving of fruit equals one medium whole fruit,  cup of dried fruit,  cup of fresh, frozen, or canned fruit. Try to avoid fruits in cups/syrups as the sugar content can be high.  Eat more foods that contain soluble fiber. Examples of foods that contain this type of fiber are apples, broccoli, carrots, beans, peas, and barley. Aim to get 20-30 g of fiber per day.  Eat more home-cooked food and less restaurant, buffet, and fast food.  Limit or avoid alcohol.  Limit foods that are high in starch and sugar.  Avoid fried foods when able.  Cook foods by using methods other than frying. Baking, boiling, grilling, and broiling are all great options. Other fat-reducing suggestions include: ? Removing the skin from poultry. ? Removing all visible fats from meats. ? Skimming the fat off of stews, soups, and gravies before serving them. ? Steaming vegetables in water or broth.  Lose weight if you are overweight. Losing just 5-10% of your initial body weight can help your overall health and prevent diseases such as diabetes and heart disease.  Increase your consumption of nuts, legumes, and seeds to 4-5 servings per week. One serving of dried beans or legumes equals  cup after being cooked, one  serving of nuts equals 1 ounces, and one serving of seeds equals  ounce or 1 tablespoon.  WHAT ARE GOOD FOODS CAN I EAT? Grains Grainy breads (try to find bread that is 3 g of fiber per slice or greater), oatmeal, light popcorn. Whole-grain cereals. Rice and pasta, including brown rice and those that are made with whole wheat. Edamame pasta is a great alternative to grain pasta. It has a higher protein content. Try to avoid significant  consumption of white bread, sugary cereals, or pastries/baked goods.  Vegetables All vegetables. Cooked white potatoes do not count as vegetables.  Fruits All fruits, but limit pineapple and bananas as these fruits have a higher sugar content.  Meats and Other Protein Sources Lean, well-trimmed beef, veal, pork, and lamb. Chicken and [place] without skin. All fish and shellfish. Wild duck, rabbit, pheasant, and venison. Egg whites or low-cholesterol egg substitutes. Dried beans, peas, lentils, and tofu.Seeds and most nuts.  Dairy Low-fat or nonfat cheeses, including ricotta, string, and mozzarella. Skim or 1% milk that is liquid, powdered, or evaporated. Buttermilk that is made with low-fat milk. Nonfat or low-fat yogurt. Soy/Almond milk are good alternatives if you cannot handle dairy.  Beverages Water is the best for you. Sports drinks with less sugar are more desirable unless you are a highly active athlete.  Sweets and Desserts Sherbets and fruit ices. Honey, jam, marmalade, jelly, and syrups. Dark chocolate.  Eat all sweets and desserts in moderation.  Fats and Oils Nonhydrogenated (trans-free) margarines. Vegetable oils, including soybean, sesame, sunflower, olive, peanut, safflower, corn, canola, and cottonseed. Salad dressings or mayonnaise that are made with a vegetable oil. Limit added fats and oils that you use for cooking, baking, salads, and as spreads.  Other Cocoa powder. Coffee and tea. Most condiments.  The items listed above may not be a complete list of recommended foods or beverages. Contact your dietitian for more options.

## 2018-02-03 NOTE — Progress Notes (Signed)
Chief Complaint  Patient presents with  . Annual Exam     Well Woman Marissa Crawford is here for a complete physical.   Her last physical was >1 year ago.  Current diet: in general, a "not great" diet. Current exercise: none. Contraception? Yes No LMP recorded.  Seatbelt? Yes   Health Maintenance Pap/HPV- Yes Tetanus- Yes 12-09-17 HIV screening- Yes   History reviewed. No pertinent past medical history.   History reviewed. No pertinent surgical history.  Medications  Current Outpatient Medications on File Prior to Visit  Medication Sig Dispense Refill  . NORLYDA 0.35 MG tablet     . Prenatal Vit-Fe Fumarate-FA (PRENATAL MULTIVITAMIN) TABS tablet Take 1 tablet by mouth daily at 12 noon.      Allergies No Known Allergies  Review of Systems: Constitutional:  no unexpected weight changes Eye:  no recent significant change in vision Ear/Nose/Mouth/Throat:  Ears:  no tinnitus or vertigo and no recent change in hearing Nose/Mouth/Throat:  no complaints of nasal congestion, no sore throat Cardiovascular: no chest pain Respiratory:  no cough and no shortness of breath Gastrointestinal:  no abdominal pain, no change in bowel habits GU:  Female: negative for dysuria or pelvic pain Musculoskeletal/Extremities:  no pain of the joints Integumentary (Skin/Breast): +lesion on scalp; otherwise no abnormal skin lesions reported Neurologic:  no headaches Endocrine:  denies fatigue Hematologic/Lymphatic:  No areas of easy bleeding  Exam BP 122/88 (BP Location: Left Arm, Patient Position: Sitting, Cuff Size: Normal)   Pulse (!) 112   Temp 98.2 F (36.8 C) (Oral)   Ht 5\' 1"  (1.549 m)   Wt 178 lb 6 oz (80.9 kg)   SpO2 97%   BMI 33.70 kg/m  General:  well developed, well nourished, in no apparent distress Skin: see below; otherwise no significant moles, warts, or growths Head:  no masses, lesions, or tenderness Eyes:  pupils equal and round, sclera anicteric without injection Ears:   canals without lesions, TMs shiny without retraction, no obvious effusion, no erythema Nose:  nares patent, septum midline, mucosa normal, and no drainage or sinus tenderness Throat/Pharynx:  lips and gingiva without lesion; tongue and uvula midline; non-inflamed pharynx; no exudates or postnasal drainage Neck: neck supple without adenopathy, thyromegaly, or masses Lungs:  clear to auscultation, breath sounds equal bilaterally, no respiratory distress Cardio:  regular rate and rhythm, no bruits, no LE edema Abdomen:  abdomen soft, nontender; bowel sounds normal; no masses or organomegaly Genital: Defer to GYN Musculoskeletal:  symmetrical muscle groups noted without atrophy or deformity Extremities:  no clubbing, cyanosis, or edema, no deformities, no skin discoloration Neuro:  gait normal; deep tendon reflexes normal and symmetric Psych: well oriented with normal range of affect and appropriate judgment/insight   R scalp  Assessment and Plan  Well adult exam - Plan: CBC, Comprehensive metabolic panel, Lipid panel   Well 37 y.o. female. Counseled on diet and exercise. Other orders as above. Regarding the skin, I think she likely had an ingrown hair on her scalp that is now forming a scar.  Follow up in 1 yr for CPE w reg PCP. The patient voiced understanding and agreement to the plan.  [place], DO 02/03/18 10:56 AM

## 2018-02-03 NOTE — Progress Notes (Signed)
Pre visit review using our clinic review tool, if applicable. No additional management support is needed unless otherwise documented below in the visit note. 

## 2018-03-23 DIAGNOSIS — H52223 Regular astigmatism, bilateral: Secondary | ICD-10-CM | POA: Diagnosis not present

## 2018-03-23 DIAGNOSIS — H5213 Myopia, bilateral: Secondary | ICD-10-CM | POA: Diagnosis not present

## 2018-04-15 ENCOUNTER — Ambulatory Visit: Payer: Self-pay

## 2018-04-15 ENCOUNTER — Encounter: Payer: Self-pay | Admitting: Medical

## 2018-04-15 ENCOUNTER — Ambulatory Visit: Payer: 59 | Admitting: Medical

## 2018-04-15 VITALS — BP 170/100 | HR 86 | Temp 98.5°F | Resp 16 | Ht 61.0 in | Wt 182.2 lb

## 2018-04-15 DIAGNOSIS — I10 Essential (primary) hypertension: Secondary | ICD-10-CM | POA: Diagnosis not present

## 2018-04-15 DIAGNOSIS — R110 Nausea: Secondary | ICD-10-CM | POA: Diagnosis not present

## 2018-04-15 DIAGNOSIS — N926 Irregular menstruation, unspecified: Secondary | ICD-10-CM

## 2018-04-15 MED ORDER — LOSARTAN POTASSIUM-HCTZ 50-12.5 MG PO TABS: 1.0000 | ORAL_TABLET | Freq: Every day | ORAL | 0 refills | Status: DC

## 2018-04-15 NOTE — Patient Instructions (Addendum)
Your blood pressure was in the 798-921 systolic range today when I checked and diastolic was in the 194 range.  This is quite a big jump from your normal blood pressure readings.  In October blood pressure was normal.  Continue to stay off any caffeine beverages and avoid any decongestant type products.  Reduce her salt intake as well.  Try to minimize smoking as nicotine can increase blood pressure some but not to the degree that you are experiencing most recently.  You report mild nausea presently but this is likely from the moderate to severely high blood pressure.  Your pregnancy test was negative.  With your blood pressure being as high today I do need to give you losartan-HCTZ 50/25 mg tablet.  I want you to start checking her blood pressure daily and expect to see gradual decrease over the next week.  If you have any cardiac or neurologic signs symptoms with high blood pressure then recommend emergency department evaluation.  We will check a metabolic panel today to assess kidney function and potassium.  Recommend follow-up in 7 to 10 days or as needed.

## 2018-04-15 NOTE — Progress Notes (Signed)
Subjective:    Patient ID: Marissa Crawford, female    DOB: 1980/11/14, 38 y.o.   MRN: [id number]  HPI  Pt in for high blood pressure. She had high bp today at dentist before teeth clearing. No ha, no blurred vision, no vomiting. Mild nausea today.  No nasal congestion. No coffee any caffeinated sodas  LMP- her last menses ended yesterday but started one week ago. But last longer than usual. But then describes it was more spotting.  Pt is on contraceptive norlyda.  Pt denying any stress recently.  Pt does smoke. Pack a day.    Review of Systems  Constitutional: Negative for chills, fatigue and fever.  Respiratory: Negative for cough, chest tightness, shortness of breath and wheezing.   Cardiovascular: Negative for chest pain and palpitations.  Gastrointestinal: Positive for nausea. Negative for abdominal pain, diarrhea and vomiting.  Musculoskeletal: Negative for back pain and neck pain.  Skin: Negative for rash.  Neurological: Negative for dizziness, tremors, syncope, speech difficulty, weakness, numbness and headaches.  Hematological: Negative for adenopathy. Does not bruise/bleed easily.  Psychiatric/Behavioral: Negative for behavioral problems, confusion, hallucinations, sleep disturbance and suicidal ideas. The patient is not nervous/anxious.    No past medical history on file.   Social History   Socioeconomic History  . Marital status: Single    Spouse name: Not on file  . Number of children: Not on file  . Years of education: Not on file  . Highest education level: Not on file  Occupational History  . Occupation: Therapist, sports --Med surg    Employer: [employer]  . Financial resource strain: Not on file  . Food insecurity:    Worry: Not on file    Inability: Not on file  . Transportation needs:    Medical: Not on file    Non-medical: Not on file  Tobacco Use  . Smoking status: Former Smoker    Packs/day: 1.00    Years: 11.00    Pack years: 11.00   Types: Cigarettes    Last attempt to quit: 08/06/2015    Years since quitting: 2.6  . Smokeless tobacco: Never Used  Substance and Sexual Activity  . Alcohol use: Yes    Alcohol/week: 2.0 standard drinks    Types: 2 Standard drinks or equivalent per week  . Drug use: No  . Sexual activity: Yes    Partners: Male    Birth control/protection: Condom, None  Lifestyle  . Physical activity:    Days per week: Not on file    Minutes per session: Not on file  . Stress: Not on file  Relationships  . Social connections:    Talks on phone: Not on file    Gets together: Not on file    Attends religious service: Not on file    Active member of club or organization: Not on file    Attends meetings of clubs or organizations: Not on file    Relationship status: Not on file  . Intimate partner violence:    Fear of current or ex partner: Not on file    Emotionally abused: Not on file    Physically abused: Not on file    Forced sexual activity: Not on file  Other Topics Concern  . Not on file  Social History Narrative   Exercise ---cardio    No past surgical history on file.  Family History  Problem Relation Age of Onset  . Cervical cancer Mother   . Cancer  Mother 90       cervical  . Hypertension Father   . Alcohol abuse Father   . Gout Father   . Seizures Sister   . Diabetes Brother   . Stroke Maternal Grandfather   . Diabetes Maternal Grandfather   . Stroke Paternal Grandmother   . Diabetes Paternal Grandmother   . Diabetes Maternal Grandmother   . Diabetes Paternal Grandfather     No Known Allergies  Current Outpatient Medications on File Prior to Visit  Medication Sig Dispense Refill  . NORLYDA 0.35 MG tablet     . Prenatal Vit-Fe Fumarate-FA (PRENATAL MULTIVITAMIN) TABS tablet Take 1 tablet by mouth daily at 12 noon.     No current facility-administered medications on file prior to visit.     BP (!) 178/105   Pulse 86   Temp 98.5 F (36.9 C) (Oral)   Resp 16    Ht 5\' 1"  (1.549 m)   Wt 182 lb 3.2 oz (82.6 kg)   SpO2 100%   BMI 34.43 kg/m       Objective:   Physical Exam  General Mental Status- Alert. General Appearance- Not in acute distress.     Neck Carotid Arteries- Normal color. Moisture- Normal Moisture. No carotid bruits. No JVD.  Chest and Lung Exam Auscultation: Breath Sounds:-Normal.  Cardiovascular Auscultation:Rythm- Regular. Murmurs & Other Heart Sounds:Auscultation of the heart reveals- No Murmurs.  Abdomen Inspection:-Inspeection Normal. Palpation/Percussion:Note:No mass. Palpation and Percussion of the abdomen reveal- Non Tender, Non Distended + BS, no rebound or guarding.  Neurologic Cranial Nerve exam:- CN III-XII intact(No nystagmus), symmetric smile. Drift Test:- No drift. Romberg Exam:- Negative.  Heal to Toe Gait exam:-Normal. Finger to Nose:- Normal/Intact Strength:- 5/5 equal and symmetric strength both upper and lower extremities.       Assessment & Plan:  Your blood pressure was in the 017-793 systolic range today when I checked and diastolic was in the 903 range.  This is quite a big jump from your normal blood pressure readings.  In October blood pressure was normal.  Continue to stay off any caffeine beverages and avoid any decongestant type products.  Reduce her salt intake as well.  Try to minimize smoking as nicotine can increase blood pressure some but not to the degree that you are experiencing most recently.  You report mild nausea presently but this is likely from the moderate to severely high blood pressure.  Your pregnancy test was negative.  With your blood pressure being as high today I do need to give you losartan-HCTZ 50/25 mg tablet.  I want you to start checking her blood pressure daily and expect to see gradual decrease over the next week.  If you have any cardiac or neurologic signs symptoms with high blood pressure then recommend emergency department evaluation.  We will check  a metabolic panel today to assess kidney function and potassium.  Recommend follow-up in 7 to 10 days or as needed.  40 minutes spent with pt. 50% of time spent on plan going forward in light of severe high blood pressure readings today.  Mackie Pai, PA-C

## 2018-04-15 NOTE — Telephone Encounter (Signed)
Pt. Reports she was at the dentist office today and her BP was 186/111. She came home and rechecked it - 198/130. Has no symptoms. States "I feel fine." Pt. Has not been diagnosed with HTN. No availability with Dr. Carollee Herter. Appointment made for today. Instructed to go to ED if symptoms develop.  Reason for Disposition . [1] Systolic BP  >= 423 OR Diastolic >= 953  AND [2] having NO cardiac or neurologic symptoms  Answer Assessment - Initial Assessment Questions 1. BLOOD PRESSURE: "What is the blood pressure?" "Did you take at least two measurements 5 minutes apart?"     186/111  198/130 2. ONSET: "When did you take your blood pressure?"     Today 3. HOW: "How did you obtain the blood pressure?" (e.g., visiting nurse, automatic home BP monitor)     Dentist office and at home 4. HISTORY: "Do you have a history of high blood pressure?"     No 5. MEDICATIONS: "Are you taking any medications for blood pressure?" "Have you missed any doses recently?"     No 6. OTHER SYMPTOMS: "Do you have any symptoms?" (e.g., headache, chest pain, blurred vision, difficulty breathing, weakness)     No 7. PREGNANCY: "Is there any chance you are pregnant?" "When was your last menstrual period?"     No  Protocols used: HIGH BLOOD PRESSURE-A-AH

## 2018-04-16 LAB — COMPREHENSIVE METABOLIC PANEL
ALBUMIN: 4.7 g/dL (ref 3.5–5.2)
ALK PHOS: 64 U/L (ref 39–117)
ALT: 22 U/L (ref 0–35)
AST: 24 U/L (ref 0–37)
BUN: 13 mg/dL (ref 6–23)
CO2: 24 mEq/L (ref 19–32)
CREATININE: 0.69 mg/dL (ref 0.40–1.20)
Calcium: 9.6 mg/dL (ref 8.4–10.5)
Chloride: 105 mEq/L (ref 96–112)
GFR: 123.45 mL/min (ref >60.00)
GLUCOSE: 80 mg/dL (ref 70–99)
Potassium: 4.1 mEq/L (ref 3.5–5.1)
Sodium: 138 mEq/L (ref 135–145)
TOTAL PROTEIN: 7.5 g/dL (ref 6.0–8.3)
Total Bilirubin: 0.5 mg/dL (ref 0.2–1.2)

## 2018-05-10 ENCOUNTER — Other Ambulatory Visit: Payer: Self-pay | Admitting: Medical

## 2018-07-08 DIAGNOSIS — A609 Anogenital herpesviral infection, unspecified: Secondary | ICD-10-CM | POA: Diagnosis not present

## 2018-07-08 DIAGNOSIS — Z6833 Body mass index (BMI) 33.0-33.9, adult: Secondary | ICD-10-CM | POA: Diagnosis not present

## 2018-07-08 DIAGNOSIS — Z113 Encounter for screening for infections with a predominantly sexual mode of transmission: Secondary | ICD-10-CM | POA: Diagnosis not present

## 2018-07-08 DIAGNOSIS — Z01419 Encounter for gynecological examination (general) (routine) without abnormal findings: Secondary | ICD-10-CM | POA: Diagnosis not present

## 2018-10-26 DIAGNOSIS — D485 Neoplasm of uncertain behavior of skin: Secondary | ICD-10-CM | POA: Diagnosis not present

## 2018-10-26 DIAGNOSIS — L931 Subacute cutaneous lupus erythematosus: Secondary | ICD-10-CM | POA: Diagnosis not present

## 2018-10-26 DIAGNOSIS — L989 Disorder of the skin and subcutaneous tissue, unspecified: Secondary | ICD-10-CM | POA: Diagnosis not present

## 2018-11-02 DIAGNOSIS — L931 Subacute cutaneous lupus erythematosus: Secondary | ICD-10-CM | POA: Diagnosis not present

## 2018-11-25 ENCOUNTER — Telehealth: Payer: 59 | Admitting: Emergency Medicine

## 2018-11-25 ENCOUNTER — Encounter: Payer: Self-pay | Admitting: Emergency Medicine

## 2018-11-25 DIAGNOSIS — H00014 Hordeolum externum left upper eyelid: Secondary | ICD-10-CM

## 2018-11-25 NOTE — Progress Notes (Addendum)
We are sorry that you are not feeling well. Here is how we plan to help!  Based on what you have shared with me it looks like you have a stye.  A stye is an inflammation of the eyelid.  It is often a red, painful lump near the edge of the eyelid that may look like a boil or a pimple.  A stye develops when an infection occurs at the base of an eyelash.   We have made appropriate suggestions for you based upon your presentation: Your condition may have resulted from an allergy causing your eye to itch and water (excessive tearing). In addition to using warm compresses applied to the eye for 10-15 minutes up to four times a day, we recommend the use of Claritin eye drops that you can obtain at your local pharmacy.  Use these drops as directed on the package.  HOME CARE:   Wash your hands often!  Let the stye open on its own. Don't squeeze or open it.  Don't rub your eyes. This can irritate your eyes and let in bacteria.  If you need to touch your eyes, wash your hands first.  Don't wear eye makeup or contact lenses until the area has healed.  GET HELP RIGHT AWAY IF:   Your symptoms do not improve.  You develop blurred or loss of vision.  Your symptoms worsen (increased discharge, pain or redness).  Thank you for choosing an e-visit.  Your e-visit answers were reviewed by a board certified advanced clinical practitioner to complete your personal care plan.  Depending upon the condition, your plan could have included both over the counter or prescription medications.  Please review your pharmacy choice.  Make sure the pharmacy is open so you can pick up prescription now.  If there is a problem, you may contact your provider through [employer] and have the prescription routed to another pharmacy.    Your safety is important to [place].  If you have drug allergies check your prescription carefully.  For the next 24 hours you can use MyChart to ask questions about today's visit, request  a non-urgent call back, or ask for a work or school excuse.  You will get an email in the next two days asking about your experience.  I hope you that your e-visit has been valuable and will speed your recovery.  Approximately 5 minutes was spent documenting and reviewing patient's chart.

## 2018-12-29 DIAGNOSIS — L932 Other local lupus erythematosus: Secondary | ICD-10-CM | POA: Diagnosis not present

## 2019-02-07 ENCOUNTER — Ambulatory Visit (INDEPENDENT_AMBULATORY_CARE_PROVIDER_SITE_OTHER): Payer: 59 | Admitting: Family Medicine

## 2019-02-07 ENCOUNTER — Other Ambulatory Visit (HOSPITAL_COMMUNITY)
Admission: RE | Admit: 2019-02-07 | Discharge: 2019-02-07 | Disposition: A | Discharge status: Home / Self Care | Payer: 59 | Source: Ambulatory Visit | Attending: Family Medicine | Admitting: Family Medicine

## 2019-02-07 ENCOUNTER — Other Ambulatory Visit: Payer: Self-pay

## 2019-02-07 ENCOUNTER — Encounter: Payer: Self-pay | Admitting: Family Medicine

## 2019-02-07 VITALS — BP 200/120 | HR 114 | Temp 98.3°F | Resp 18 | Ht 61.0 in | Wt 177.2 lb

## 2019-02-07 DIAGNOSIS — I10 Essential (primary) hypertension: Secondary | ICD-10-CM | POA: Diagnosis not present

## 2019-02-07 DIAGNOSIS — R8290 Unspecified abnormal findings in urine: Secondary | ICD-10-CM

## 2019-02-07 DIAGNOSIS — E669 Obesity, unspecified: Secondary | ICD-10-CM | POA: Insufficient documentation

## 2019-02-07 DIAGNOSIS — Z7251 High risk heterosexual behavior: Secondary | ICD-10-CM | POA: Diagnosis not present

## 2019-02-07 DIAGNOSIS — Z0000 Encounter for general adult medical examination without abnormal findings: Secondary | ICD-10-CM | POA: Diagnosis not present

## 2019-02-07 LAB — CBC WITH DIFFERENTIAL/PLATELET
Basophils Absolute: 0.1 10*3/uL (ref 0.0–0.1)
Basophils Relative: 1.1 % (ref 0.0–3.0)
Eosinophils Absolute: 0.2 10*3/uL (ref 0.0–0.7)
Eosinophils Relative: 2.6 % (ref 0.0–5.0)
HCT: 43 % (ref 36.0–46.0)
Hemoglobin: 14.8 g/dL (ref 12.0–15.0)
Lymphocytes Relative: 37.1 % (ref 12.0–46.0)
Lymphs Abs: 2.6 10*3/uL (ref 0.7–4.0)
MCHC: 34.4 g/dL (ref 30.0–36.0)
MCV: 99.9 fl (ref 78.0–100.0)
Monocytes Absolute: 0.6 10*3/uL (ref 0.1–1.0)
Monocytes Relative: 8.2 % (ref 3.0–12.0)
Neutro Abs: 3.6 10*3/uL (ref 1.4–7.7)
Neutrophils Relative %: 51 % (ref 43.0–77.0)
Platelets: 273 10*3/uL (ref 150.0–400.0)
RBC: 4.3 Mil/uL (ref 3.87–5.11)
RDW: 12.6 % (ref 11.5–15.5)
WBC: 7 10*3/uL (ref 4.0–10.5)

## 2019-02-07 LAB — LIPID PANEL
Cholesterol: 270 mg/dL — ABNORMAL HIGH (ref 0–200)
HDL: 58.9 mg/dL (ref >39.00)
LDL Cholesterol: 180 mg/dL — ABNORMAL HIGH (ref 0–99)
NonHDL: 211.21
Total CHOL/HDL Ratio: 5
Triglycerides: 157 mg/dL — ABNORMAL HIGH (ref 0.0–149.0)
VLDL: 31.4 mg/dL (ref 0.0–40.0)

## 2019-02-07 LAB — COMPREHENSIVE METABOLIC PANEL
ALT: 121 U/L — ABNORMAL HIGH (ref 0–35)
AST: 157 U/L — ABNORMAL HIGH (ref 0–37)
Albumin: 4.8 g/dL (ref 3.5–5.2)
Alkaline Phosphatase: 67 U/L (ref 39–117)
BUN: 8 mg/dL (ref 6–23)
CO2: 25 mEq/L (ref 19–32)
Calcium: 9.9 mg/dL (ref 8.4–10.5)
Chloride: 101 mEq/L (ref 96–112)
Creatinine, Ser: 0.58 mg/dL (ref 0.40–1.20)
GFR: 141.3 mL/min (ref >60.00)
Glucose, Bld: 98 mg/dL (ref 70–99)
Potassium: 3.2 mEq/L — ABNORMAL LOW (ref 3.5–5.1)
Sodium: 137 mEq/L (ref 135–145)
Total Bilirubin: 0.5 mg/dL (ref 0.2–1.2)
Total Protein: 7.9 g/dL (ref 6.0–8.3)

## 2019-02-07 LAB — POCT URINALYSIS DIP (MANUAL ENTRY)
Bilirubin, UA: NEGATIVE
Glucose, UA: NEGATIVE mg/dL
Leukocytes, UA: NEGATIVE
Nitrite, UA: NEGATIVE
Protein Ur, POC: 30 mg/dL — AB
Spec Grav, UA: 1.02 (ref 1.010–1.025)
Urobilinogen, UA: 0.2 E.U./dL
pH, UA: 6 (ref 5.0–8.0)

## 2019-02-07 LAB — TSH: TSH: 2.98 u[IU]/mL (ref 0.35–4.50)

## 2019-02-07 MED ORDER — METOPROLOL SUCCINATE ER 50 MG PO TB24: 50.0000 mg | ORAL_TABLET | Freq: Every day | ORAL | 3 refills | Status: DC

## 2019-02-07 NOTE — Patient Instructions (Signed)
Preventive Care 25-37 Years Old, Female Preventive care refers to visits with your health care provider and lifestyle choices that can promote health and wellness. This includes:  A yearly physical exam. This may also be called an annual well check.  Regular dental visits and eye exams.  Immunizations.  Screening for certain conditions.  Healthy lifestyle choices, such as eating a healthy diet, getting regular exercise, not using drugs or products that contain nicotine and tobacco, and limiting alcohol use. What can I expect for my preventive care visit? Physical exam Your health care provider will check your:  Height and weight. This may be used to calculate body mass index (BMI), which tells if you are at a healthy weight.  Heart rate and blood pressure.  Skin for abnormal spots. Counseling Your health care provider may ask you questions about your:  Alcohol, tobacco, and drug use.  Emotional well-being.  Home and relationship well-being.  Sexual activity.  Eating habits.  Work and work Statistician.  Method of birth control.  Menstrual cycle.  Pregnancy history. What immunizations do I need?  Influenza (flu) vaccine  This is recommended every year. Tetanus, diphtheria, and pertussis (Tdap) vaccine  You may need a Td booster every 10 years. Varicella (chickenpox) vaccine  You may need this if you have not been vaccinated. Human papillomavirus (HPV) vaccine  If recommended by your health care provider, you may need three doses over 6 months. Measles, mumps, and rubella (MMR) vaccine  You may need at least one dose of MMR. You may also need a second dose. Meningococcal conjugate (MenACWY) vaccine  One dose is recommended if you are age 70-21 years and a first-year college student living in a residence hall, or if you have one of several medical conditions. You may also need additional booster doses. Pneumococcal conjugate (PCV13) vaccine  You may need  this if you have certain conditions and were not previously vaccinated. Pneumococcal polysaccharide (PPSV23) vaccine  You may need one or two doses if you smoke cigarettes or if you have certain conditions. Hepatitis A vaccine  You may need this if you have certain conditions or if you travel or work in places where you may be exposed to hepatitis A. Hepatitis B vaccine  You may need this if you have certain conditions or if you travel or work in places where you may be exposed to hepatitis B. Haemophilus influenzae type b (Hib) vaccine  You may need this if you have certain conditions. You may receive vaccines as individual doses or as more than one vaccine together in one shot (combination vaccines). Talk with your health care provider about the risks and benefits of combination vaccines. What tests do I need?  Blood tests  Lipid and cholesterol levels. These may be checked every 5 years starting at age 69.  Hepatitis C test.  Hepatitis B test. Screening  Diabetes screening. This is done by checking your blood sugar (glucose) after you have not eaten for a while (fasting).  Sexually transmitted disease (STD) testing.  BRCA-related cancer screening. This may be done if you have a family history of breast, ovarian, tubal, or peritoneal cancers.  Pelvic exam and Pap test. This may be done every 3 years starting at age 68. Starting at age 39, this may be done every 5 years if you have a Pap test in combination with an HPV test. Talk with your health care provider about your test results, treatment options, and if necessary, the need for more tests.  Follow these instructions at home: Eating and drinking   Eat a diet that includes fresh fruits and vegetables, whole grains, lean protein, and low-fat dairy.  Take vitamin and mineral supplements as recommended by your health care provider.  Do not drink alcohol if: ? Your health care provider tells you not to drink. ? You are  pregnant, may be pregnant, or are planning to become pregnant.  If you drink alcohol: ? Limit how much you have to 0-1 drink a day. ? Be aware of how much alcohol is in your drink. In the [place] one drink equals one 12 oz bottle of beer (355 mL), one 5 oz glass of wine (148 mL), or one 1 oz glass of hard liquor (44 mL). Lifestyle  Take daily care of your teeth and gums.  Stay active. Exercise for at least 30 minutes on 5 or more days each week.  Do not use any products that contain nicotine or tobacco, such as cigarettes, e-cigarettes, and chewing tobacco. If you need help quitting, ask your health care provider.  If you are sexually active, practice safe sex. Use a condom or other form of birth control (contraception) in order to prevent pregnancy and STIs (sexually transmitted infections). If you plan to become pregnant, see your health care provider for a preconception visit. What's next?  Visit your health care provider once a year for a well check visit.  Ask your health care provider how often you should have your eyes and teeth checked.  Stay up to date on all vaccines. This information is not intended to replace advice given to you by your health care provider. Make sure you discuss any questions you have with your health care provider. Document Released: 05/20/2001 Document Revised: 12/03/2017 Document Reviewed: 12/03/2017 Elsevier Patient Education  2020 [employer]  

## 2019-02-07 NOTE — Progress Notes (Signed)
Subjective:     Marissa Crawford is a 38 y.o. female and is here for a comprehensive physical exam. The patient reports problems - she did not like the bp meds so she stopped taking them.  Social History   Socioeconomic History  . Marital status: Single    Spouse name: Not on file  . Number of children: Not on file  . Years of education: Not on file  . Highest education level: Not on file  Occupational History  . Occupation: Therapist, sports --Med surg    Employer: [employer]  . Financial resource strain: Not on file  . Food insecurity    Worry: Not on file    Inability: Not on file  . Transportation needs    Medical: Not on file    Non-medical: Not on file  Tobacco Use  . Smoking status: Former Smoker    Packs/day: 1.00    Years: 11.00    Pack years: 11.00    Types: Cigarettes    Quit date: 08/06/2015    Years since quitting: 3.5  . Smokeless tobacco: Never Used  Substance and Sexual Activity  . Alcohol use: Yes    Alcohol/week: 2.0 standard drinks    Types: 2 Standard drinks or equivalent per week  . Drug use: No  . Sexual activity: Yes    Partners: Male    Birth control/protection: Condom, None  Lifestyle  . Physical activity    Days per week: Not on file    Minutes per session: Not on file  . Stress: Not on file  Relationships  . Social Herbalist on phone: Not on file    Gets together: Not on file    Attends religious service: Not on file    Active member of club or organization: Not on file    Attends meetings of clubs or organizations: Not on file    Relationship status: Not on file  . Intimate partner violence    Fear of current or ex partner: Not on file    Emotionally abused: Not on file    Physically abused: Not on file    Forced sexual activity: Not on file  Other Topics Concern  . Not on file  Social History Narrative   Exercise ---cardio   Health Maintenance  Topic Date Due  . TETANUS/TDAP  04/08/2015  . PAP SMEAR-Modifier   05/11/2019  . INFLUENZA VACCINE  Completed  . HIV Screening  Completed    The following portions of the patient's history were reviewed and updated as appropriate:  She  has no past medical history on file. She does not have any pertinent problems on file. She  has no past surgical history on file. Her family history includes Alcohol abuse in her father; Cancer (age of onset: 50) in her mother; Cervical cancer in her mother; Diabetes in her brother, maternal grandfather, maternal grandmother, paternal grandfather, and paternal grandmother; Gout in her father; Hypertension in her father; Seizures in her sister; Stroke in her maternal grandfather and paternal grandmother. She  reports that she quit smoking about 3 years ago. Her smoking use included cigarettes. She has a 11.00 pack-year smoking history. She has never used smokeless tobacco. She reports current alcohol use of about 2.0 standard drinks of alcohol per week. She reports that she does not use drugs. She has a current medication list which includes the following prescription(s): metoprolol succinate, norlyda, and prenatal multivitamin. Current Outpatient Medications on File  Prior to Visit  Medication Sig Dispense Refill  . NORLYDA 0.35 MG tablet     . Prenatal Vit-Fe Fumarate-FA (PRENATAL MULTIVITAMIN) TABS tablet Take 1 tablet by mouth daily at 12 noon.     No current facility-administered medications on file prior to visit.    She has No Known Allergies..  Review of Systems Review of Systems  Constitutional: Negative for activity change, appetite change and fatigue.  HENT: Negative for hearing loss, congestion, tinnitus and ear discharge.  dentist q71m Eyes: Negative for visual disturbance (see optho q1y -- vision corrected to 20/20 with glasses).  Respiratory: Negative for cough, chest tightness and shortness of breath.   Cardiovascular: Negative for chest pain, palpitations and leg swelling.  Gastrointestinal: Negative for  abdominal pain, diarrhea, constipation and abdominal distention.  Genitourinary: Negative for urgency, frequency, decreased urine volume and difficulty urinating.  Musculoskeletal: Negative for back pain, arthralgias and gait problem.  Skin: Negative for color change, pallor and rash.  Neurological: Negative for dizziness, light-headedness, numbness and headaches.  Hematological: Negative for adenopathy. Does not bruise/bleed easily.  Psychiatric/Behavioral: Negative for suicidal ideas, confusion, sleep disturbance, self-injury, dysphoric mood, decreased concentration and agitation.       Objective:    BP (!) 200/120   Pulse (!) 114   Temp 98.3 F (36.8 C) (Temporal)   Resp 18   Ht 5\' 1"  (1.549 m)   Wt 177 lb 3.2 oz (80.4 kg)   SpO2 98%   BMI 33.48 kg/m  General appearance: alert, cooperative, appears stated age and no distress Head: Normocephalic, without obvious abnormality, atraumatic Eyes: negative findings: lids and lashes normal, conjunctivae and sclerae normal and pupils equal, round, reactive to light and accomodation Ears: normal TM's and external ear canals both ears Nose: Nares normal. Septum midline. Mucosa normal. No drainage or sinus tenderness. Throat: lips, mucosa, and tongue normal; teeth and gums normal Neck: no adenopathy, no carotid bruit, no JVD, supple, symmetrical, trachea midline and thyroid not enlarged, symmetric, no tenderness/mass/nodules Back: symmetric, no curvature. ROM normal. No CVA tenderness. Lungs: clear to auscultation bilaterally Breasts: gyn Heart: regular rate and rhythm, S1, S2 normal, no murmur, click, rub or gallop Abdomen: soft, non-tender; bowel sounds normal; no masses,  no organomegaly Pelvic: deferred -gyn Extremities: extremities normal, atraumatic, no cyanosis or edema Pulses: 2+ and symmetric Skin: Skin color, texture, turgor normal. No rashes or lesions Lymph nodes: Cervical, supraclavicular, and axillary nodes  normal. Neurologic: Alert and oriented X 3, normal strength and tone. Normal symmetric reflexes. Normal coordination and gait    Assessment:    Healthy female exam.      Plan:    ghm utd Check labs  See After Visit Summary for Counseling Recommendations    1. High risk heterosexual behavior Pt requesting std testing -- she has no symptoms  - HIV Antibody (routine testing w rflx) - RPR - Urine cytology ancillary only([place]) - Hepatitis C antibody  2. Preventative health care ghm utd See above Pt received flu shot at work - Lipid panel - CBC with Differential/Platelet - TSH - Comprehensive metabolic panel  3. Class 1 drug-induced obesity with serious comorbidity and body mass index (BMI) of 33.0 to 33.9 in adult Pt is thinking about healthy weight and wellness  - Amb Ref to Medical Weight Management  4. Essential hypertension Poorly controlled will alter medications, encouraged DASH diet, minimize caffeine and obtain adequate sleep. Report concerning symptoms and follow up as directed and as needed - metoprolol succinate (TOPROL-XL)  50 MG 24 hr tablet; Take 1 tablet (50 mg total) by mouth daily. Take with or immediately following a meal.  Dispense: 90 tablet; Refill: 3  5. Abnormal urine   - Urine Culture - POCT urinalysis dipstick

## 2019-02-08 LAB — HEPATITIS C ANTIBODY
Hepatitis C Ab: NONREACTIVE
SIGNAL TO CUT-OFF: 0.01 (ref <1.00)

## 2019-02-08 LAB — RPR: RPR Ser Ql: NONREACTIVE

## 2019-02-08 LAB — HIV ANTIBODY (ROUTINE TESTING W REFLEX): HIV 1&2 Ab, 4th Generation: NONREACTIVE

## 2019-02-09 LAB — URINE CULTURE
MICRO NUMBER:: 1054556
Result:: NO GROWTH
SPECIMEN QUALITY:: ADEQUATE

## 2019-02-11 ENCOUNTER — Other Ambulatory Visit: Payer: Self-pay

## 2019-02-11 DIAGNOSIS — B9689 Other specified bacterial agents as the cause of diseases classified elsewhere: Secondary | ICD-10-CM

## 2019-02-11 DIAGNOSIS — Z7251 High risk heterosexual behavior: Secondary | ICD-10-CM

## 2019-02-11 LAB — URINE CYTOLOGY ANCILLARY ONLY
Bacterial Vaginitis-Urine: POSITIVE — AB
Bacterial Vaginitis-Urine: POSITIVE — AB
Bacterial Vaginitis-Urine: POSITIVE — AB
Bacterial Vaginitis-Urine: POSITIVE — AB
Bacterial Vaginitis-Urine: POSITIVE — AB
Candida Urine: NEGATIVE
Chlamydia: POSITIVE — AB
Comment: NEGATIVE
Comment: NEGATIVE
Comment: NORMAL
Neisseria Gonorrhea: NEGATIVE
Trichomonas: NEGATIVE

## 2019-02-11 MED ORDER — METRONIDAZOLE 500 MG PO TABS: 500.0000 mg | ORAL_TABLET | Freq: Two times a day (BID) | ORAL | 0 refills | Status: DC

## 2019-02-11 MED ORDER — AZITHROMYCIN 250 MG PO TABS: ORAL_TABLET | ORAL | 0 refills | Status: DC

## 2019-03-01 ENCOUNTER — Other Ambulatory Visit: Payer: Self-pay

## 2019-03-01 ENCOUNTER — Encounter: Payer: Self-pay | Admitting: Family Medicine

## 2019-03-01 ENCOUNTER — Ambulatory Visit: Payer: 59 | Admitting: Family Medicine

## 2019-03-01 ENCOUNTER — Other Ambulatory Visit: Payer: Self-pay | Admitting: Family Medicine

## 2019-03-01 VITALS — BP 160/100 | HR 86 | Temp 97.7°F | Resp 18 | Ht 61.0 in | Wt 178.4 lb

## 2019-03-01 DIAGNOSIS — I10 Essential (primary) hypertension: Secondary | ICD-10-CM

## 2019-03-01 DIAGNOSIS — R748 Abnormal levels of other serum enzymes: Secondary | ICD-10-CM

## 2019-03-01 LAB — COMPREHENSIVE METABOLIC PANEL
ALT: 47 U/L — ABNORMAL HIGH (ref 0–35)
AST: 50 U/L — ABNORMAL HIGH (ref 0–37)
Albumin: 4.2 g/dL (ref 3.5–5.2)
Alkaline Phosphatase: 63 U/L (ref 39–117)
BUN: 14 mg/dL (ref 6–23)
CO2: 29 mEq/L (ref 19–32)
Calcium: 9.6 mg/dL (ref 8.4–10.5)
Chloride: 102 mEq/L (ref 96–112)
Creatinine, Ser: 0.68 mg/dL (ref 0.40–1.20)
GFR: 117.56 mL/min (ref >60.00)
Glucose, Bld: 106 mg/dL — ABNORMAL HIGH (ref 70–99)
Potassium: 3.9 mEq/L (ref 3.5–5.1)
Sodium: 138 mEq/L (ref 135–145)
Total Bilirubin: 0.7 mg/dL (ref 0.2–1.2)
Total Protein: 7.1 g/dL (ref 6.0–8.3)

## 2019-03-01 MED ORDER — LOSARTAN POTASSIUM-HCTZ 100-25 MG PO TABS: 1.0000 | ORAL_TABLET | Freq: Every day | ORAL | 3 refills | Status: DC

## 2019-03-01 NOTE — Patient Instructions (Signed)

## 2019-03-01 NOTE — Progress Notes (Signed)
Patient ID: Marissa Crawford, female    DOB: [birth year]  Age: 38 y.o. MRN: [id number]    Subjective:  Subjective  HPI Marissa Crawford presents for f/u bp.  It is running high.  She took her losartan hct again.  No other symptoms   Review of Systems  Constitutional: Negative for appetite change, diaphoresis, fatigue and unexpected weight change.  Eyes: Negative for pain, redness and visual disturbance.  Respiratory: Negative for cough, chest tightness, shortness of breath and wheezing.   Cardiovascular: Negative for chest pain, palpitations and leg swelling.  Endocrine: Negative for cold intolerance, heat intolerance, polydipsia, polyphagia and polyuria.  Genitourinary: Negative for difficulty urinating, dysuria and frequency.  Neurological: Negative for dizziness, light-headedness, numbness and headaches.    History No past medical history on file.  She has no past surgical history on file.   Her family history includes Alcohol abuse in her father; Cancer (age of onset: 80) in her mother; Cervical cancer in her mother; Diabetes in her brother, maternal grandfather, maternal grandmother, paternal grandfather, and paternal grandmother; Gout in her father; Hypertension in her father; Seizures in her sister; Stroke in her maternal grandfather and paternal grandmother.She reports that she quit smoking about 3 years ago. Her smoking use included cigarettes. She has a 11.00 pack-year smoking history. She has never used smokeless tobacco. She reports current alcohol use of about 2.0 standard drinks of alcohol per week. She reports that she does not use drugs.  Current Outpatient Medications on File Prior to Visit  Medication Sig Dispense Refill  . metoprolol succinate (TOPROL-XL) 50 MG 24 hr tablet Take 1 tablet (50 mg total) by mouth daily. Take with or immediately following a meal. 90 tablet 3  . NORLYDA 0.35 MG tablet     . Prenatal Vit-Fe Fumarate-FA (PRENATAL MULTIVITAMIN) TABS tablet Take 1 tablet  by mouth daily at 12 noon.     No current facility-administered medications on file prior to visit.      Objective:  Objective  Physical Exam Vitals signs and nursing note reviewed.  Constitutional:      Appearance: She is well-developed.  HENT:     Head: Normocephalic and atraumatic.  Eyes:     Conjunctiva/sclera: Conjunctivae normal.  Neck:     Musculoskeletal: Normal range of motion and neck supple.     Thyroid: No thyromegaly.     Vascular: No carotid bruit or JVD.  Cardiovascular:     Rate and Rhythm: Normal rate and regular rhythm.     Heart sounds: Normal heart sounds. No murmur.  Pulmonary:     Effort: Pulmonary effort is normal. No respiratory distress.     Breath sounds: Normal breath sounds. No wheezing or rales.  Chest:     Chest wall: No tenderness.  Neurological:     Mental Status: She is alert and oriented to person, place, and time.    BP (!) 160/100 (BP Location: Right Arm, Patient Position: Sitting, Cuff Size: Normal)   Pulse 86   Temp 97.7 F (36.5 C) (Temporal)   Resp 18   Ht 5\' 1"  (1.549 m)   Wt 178 lb 6.4 oz (80.9 kg)   SpO2 98%   BMI 33.71 kg/m  Wt Readings from Last 3 Encounters:  03/01/19 178 lb 6.4 oz (80.9 kg)  02/07/19 177 lb 3.2 oz (80.4 kg)  04/15/18 182 lb 3.2 oz (82.6 kg)     Lab Results  Component Value Date   WBC 7.0 02/07/2019   HGB  14.8 02/07/2019   HCT 43.0 02/07/2019   PLT 273.0 02/07/2019   GLUCOSE 106 (H) 03/01/2019   CHOL 270 (H) 02/07/2019   TRIG 157.0 (H) 02/07/2019   HDL 58.90 02/07/2019   LDLCALC 180 (H) 02/07/2019   ALT 47 (H) 03/01/2019   AST 50 (H) 03/01/2019   NA 138 03/01/2019   K 3.9 03/01/2019   CL 102 03/01/2019   CREATININE 0.68 03/01/2019   BUN 14 03/01/2019   CO2 29 03/01/2019   TSH 2.98 02/07/2019    No results found.   Assessment & Plan:  Plan  I have discontinued Jameika I. Reichart's metroNIDAZOLE and azithromycin. I am also having her start on losartan-hydrochlorothiazide. Additionally, I  am having her maintain her prenatal multivitamin, Norlyda, and metoprolol succinate.  Meds ordered this encounter  Medications  . losartan-hydrochlorothiazide (HYZAAR) 100-25 MG tablet    Sig: Take 1 tablet by mouth daily.    Dispense:  90 tablet    Refill:  3    Problem List Items Addressed This Visit      Unprioritized   HTN (hypertension) - Primary    Poorly controlled will alter medications, encouraged DASH diet, minimize caffeine and obtain adequate sleep. Report concerning symptoms and follow up as directed and as needed      Relevant Medications   losartan-hydrochlorothiazide (HYZAAR) 100-25 MG tablet   Other Relevant Orders   Comprehensive metabolic panel (Completed)      Follow-up: Return in about 3 weeks (around 03/22/2019), or if symptoms worsen or fail to improve.  Ann Held, DO                                                                                       )-

## 2019-03-01 NOTE — Assessment & Plan Note (Signed)
Poorly controlled will alter medications, encouraged DASH diet, minimize caffeine and obtain adequate sleep. Report concerning symptoms and follow up as directed and as needed 

## 2019-07-04 DIAGNOSIS — I10 Essential (primary) hypertension: Secondary | ICD-10-CM | POA: Diagnosis not present

## 2019-07-04 DIAGNOSIS — N898 Other specified noninflammatory disorders of vagina: Secondary | ICD-10-CM | POA: Diagnosis not present

## 2019-07-04 DIAGNOSIS — Z113 Encounter for screening for infections with a predominantly sexual mode of transmission: Secondary | ICD-10-CM | POA: Diagnosis not present

## 2019-08-01 ENCOUNTER — Ambulatory Visit: Payer: 59 | Attending: Internal Medicine

## 2019-08-01 DIAGNOSIS — Z20822 Contact with and (suspected) exposure to covid-19: Secondary | ICD-10-CM | POA: Diagnosis not present

## 2019-08-02 LAB — SARS-COV-2, NAA 2 DAY TAT

## 2019-08-02 LAB — NOVEL CORONAVIRUS, NAA: SARS-CoV-2, NAA: NOT DETECTED

## 2019-10-07 DIAGNOSIS — Z6833 Body mass index (BMI) 33.0-33.9, adult: Secondary | ICD-10-CM | POA: Diagnosis not present

## 2019-10-07 DIAGNOSIS — Z01419 Encounter for gynecological examination (general) (routine) without abnormal findings: Secondary | ICD-10-CM | POA: Diagnosis not present

## 2019-10-11 DIAGNOSIS — Z01419 Encounter for gynecological examination (general) (routine) without abnormal findings: Secondary | ICD-10-CM | POA: Diagnosis not present

## 2020-01-16 MED FILL — LOSARTAN-HCTZ 100-25 MG TAB: 100-25 | 90 days supply | Qty: 90 | Fill #0

## 2020-01-16 MED FILL — METOPROLOL SUCCINATE ER 50: 50 | 90 days supply | Qty: 90 | Fill #0

## 2020-03-12 ENCOUNTER — Encounter: Payer: 59 | Admitting: Family Medicine

## 2020-03-26 ENCOUNTER — Ambulatory Visit (INDEPENDENT_AMBULATORY_CARE_PROVIDER_SITE_OTHER): Payer: 59 | Admitting: Family Medicine

## 2020-03-26 ENCOUNTER — Encounter: Payer: Self-pay | Admitting: Family Medicine

## 2020-03-26 ENCOUNTER — Other Ambulatory Visit: Payer: Self-pay | Admitting: Family Medicine

## 2020-03-26 ENCOUNTER — Other Ambulatory Visit: Payer: Self-pay

## 2020-03-26 VITALS — BP 150/100 | HR 95 | Temp 98.2°F | Resp 18 | Ht 61.0 in | Wt 186.8 lb

## 2020-03-26 DIAGNOSIS — K148 Other diseases of tongue: Secondary | ICD-10-CM

## 2020-03-26 DIAGNOSIS — Z0000 Encounter for general adult medical examination without abnormal findings: Secondary | ICD-10-CM | POA: Diagnosis not present

## 2020-03-26 DIAGNOSIS — I10 Essential (primary) hypertension: Secondary | ICD-10-CM | POA: Diagnosis not present

## 2020-03-26 DIAGNOSIS — R002 Palpitations: Secondary | ICD-10-CM | POA: Diagnosis not present

## 2020-03-26 LAB — CBC WITH DIFFERENTIAL/PLATELET
Basophils Absolute: 0.1 10*3/uL (ref 0.0–0.1)
Basophils Relative: 0.8 % (ref 0.0–3.0)
Eosinophils Absolute: 0.2 10*3/uL (ref 0.0–0.7)
Eosinophils Relative: 2.5 % (ref 0.0–5.0)
HCT: 39.8 % (ref 36.0–46.0)
Hemoglobin: 13.6 g/dL (ref 12.0–15.0)
Lymphocytes Relative: 48.4 % — ABNORMAL HIGH (ref 12.0–46.0)
Lymphs Abs: 3 10*3/uL (ref 0.7–4.0)
MCHC: 34.1 g/dL (ref 30.0–36.0)
MCV: 97.9 fl (ref 78.0–100.0)
Monocytes Absolute: 0.7 10*3/uL (ref 0.1–1.0)
Monocytes Relative: 11.3 % (ref 3.0–12.0)
Neutro Abs: 2.3 10*3/uL (ref 1.4–7.7)
Neutrophils Relative %: 37 % — ABNORMAL LOW (ref 43.0–77.0)
Platelets: 251 10*3/uL (ref 150.0–400.0)
RBC: 4.07 Mil/uL (ref 3.87–5.11)
RDW: 13.3 % (ref 11.5–15.5)
WBC: 6.2 10*3/uL (ref 4.0–10.5)

## 2020-03-26 LAB — COMPREHENSIVE METABOLIC PANEL
ALT: 76 U/L — ABNORMAL HIGH (ref 0–35)
AST: 44 U/L — ABNORMAL HIGH (ref 0–37)
Albumin: 4.4 g/dL (ref 3.5–5.2)
Alkaline Phosphatase: 55 U/L (ref 39–117)
BUN: 12 mg/dL (ref 6–23)
CO2: 26 mEq/L (ref 19–32)
Calcium: 9.3 mg/dL (ref 8.4–10.5)
Chloride: 103 mEq/L (ref 96–112)
Creatinine, Ser: 0.72 mg/dL (ref 0.40–1.20)
GFR: 106.04 mL/min (ref >60.00)
Glucose, Bld: 76 mg/dL (ref 70–99)
Potassium: 3.8 mEq/L (ref 3.5–5.1)
Sodium: 138 mEq/L (ref 135–145)
Total Bilirubin: 0.5 mg/dL (ref 0.2–1.2)
Total Protein: 7.5 g/dL (ref 6.0–8.3)

## 2020-03-26 LAB — LIPID PANEL
Cholesterol: 223 mg/dL — ABNORMAL HIGH (ref 0–200)
HDL: 42.4 mg/dL (ref >39.00)
NonHDL: 180.64
Total CHOL/HDL Ratio: 5
Triglycerides: 276 mg/dL — ABNORMAL HIGH (ref 0.0–149.0)
VLDL: 55.2 mg/dL — ABNORMAL HIGH (ref 0.0–40.0)

## 2020-03-26 LAB — LDL CHOLESTEROL, DIRECT: Direct LDL: 148 mg/dL

## 2020-03-26 LAB — TSH: TSH: 3.56 u[IU]/mL (ref 0.35–4.50)

## 2020-03-26 MED ORDER — LOSARTAN POTASSIUM-HCTZ 100-25 MG PO TABS: 1.0000 | ORAL_TABLET | Freq: Every day | ORAL | 3 refills | Status: DC

## 2020-03-26 MED ORDER — METOPROLOL SUCCINATE ER 100 MG PO TB24: 100.0000 mg | ORAL_TABLET | Freq: Every day | ORAL | 3 refills | Status: DC

## 2020-03-26 MED FILL — METOPROLOL SUCCINATE ER 100: 100 | 90 days supply | Qty: 90 | Fill #0

## 2020-03-26 NOTE — Patient Instructions (Signed)
Preventive Care 21-39 Years Old, Female Preventive care refers to visits with your health care provider and lifestyle choices that can promote health and wellness. This includes:  A yearly physical exam. This may also be called an annual well check.  Regular dental visits and eye exams.  Immunizations.  Screening for certain conditions.  Healthy lifestyle choices, such as eating a healthy diet, getting regular exercise, not using drugs or products that contain nicotine and tobacco, and limiting alcohol use. What can I expect for my preventive care visit? Physical exam Your health care provider will check your:  Height and weight. This may be used to calculate body mass index (BMI), which tells if you are at a healthy weight.  Heart rate and blood pressure.  Skin for abnormal spots. Counseling Your health care provider may ask you questions about your:  Alcohol, tobacco, and drug use.  Emotional well-being.  Home and relationship well-being.  Sexual activity.  Eating habits.  Work and work environment.  Method of birth control.  Menstrual cycle.  Pregnancy history. What immunizations do I need?  Influenza (flu) vaccine  This is recommended every year. Tetanus, diphtheria, and pertussis (Tdap) vaccine  You may need a Td booster every 10 years. Varicella (chickenpox) vaccine  You may need this if you have not been vaccinated. Human papillomavirus (HPV) vaccine  If recommended by your health care provider, you may need three doses over 6 months. Measles, mumps, and rubella (MMR) vaccine  You may need at least one dose of MMR. You may also need a second dose. Meningococcal conjugate (MenACWY) vaccine  One dose is recommended if you are age 19-21 years and a first-year college student living in a residence hall, or if you have one of several medical conditions. You may also need additional booster doses. Pneumococcal conjugate (PCV13) vaccine  You may need  this if you have certain conditions and were not previously vaccinated. Pneumococcal polysaccharide (PPSV23) vaccine  You may need one or two doses if you smoke cigarettes or if you have certain conditions. Hepatitis A vaccine  You may need this if you have certain conditions or if you travel or work in places where you may be exposed to hepatitis A. Hepatitis B vaccine  You may need this if you have certain conditions or if you travel or work in places where you may be exposed to hepatitis B. Haemophilus influenzae type b (Hib) vaccine  You may need this if you have certain conditions. You may receive vaccines as individual doses or as more than one vaccine together in one shot (combination vaccines). Talk with your health care provider about the risks and benefits of combination vaccines. What tests do I need?  Blood tests  Lipid and cholesterol levels. These may be checked every 5 years starting at age 20.  Hepatitis C test.  Hepatitis B test. Screening  Diabetes screening. This is done by checking your blood sugar (glucose) after you have not eaten for a while (fasting).  Sexually transmitted disease (STD) testing.  BRCA-related cancer screening. This may be done if you have a family history of breast, ovarian, tubal, or peritoneal cancers.  Pelvic exam and Pap test. This may be done every 3 years starting at age 21. Starting at age 30, this may be done every 5 years if you have a Pap test in combination with an HPV test. Talk with your health care provider about your test results, treatment options, and if necessary, the need for more tests.   Follow these instructions at home: Eating and drinking   Eat a diet that includes fresh fruits and vegetables, whole grains, lean protein, and low-fat dairy.  Take vitamin and mineral supplements as recommended by your health care provider.  Do not drink alcohol if: ? Your health care provider tells you not to drink. ? You are  pregnant, may be pregnant, or are planning to become pregnant.  If you drink alcohol: ? Limit how much you have to 0-1 drink a day. ? Be aware of how much alcohol is in your drink. In the [place] one drink equals one 12 oz bottle of beer (355 mL), one 5 oz glass of wine (148 mL), or one 1 oz glass of hard liquor (44 mL). Lifestyle  Take daily care of your teeth and gums.  Stay active. Exercise for at least 30 minutes on 5 or more days each week.  Do not use any products that contain nicotine or tobacco, such as cigarettes, e-cigarettes, and chewing tobacco. If you need help quitting, ask your health care provider.  If you are sexually active, practice safe sex. Use a condom or other form of birth control (contraception) in order to prevent pregnancy and STIs (sexually transmitted infections). If you plan to become pregnant, see your health care provider for a preconception visit. What's next?  Visit your health care provider once a year for a well check visit.  Ask your health care provider how often you should have your eyes and teeth checked.  Stay up to date on all vaccines. This information is not intended to replace advice given to you by your health care provider. Make sure you discuss any questions you have with your health care provider. Document Revised: 12/03/2017 Document Reviewed: 12/03/2017 Elsevier Patient Education  [place].  Smoking Tobacco Information, Adult Smoking tobacco can be harmful to your health. Tobacco contains a poisonous (toxic), colorless chemical called nicotine. Nicotine is addictive. It changes the brain and can make it hard to stop smoking. Tobacco also has other toxic chemicals that can hurt your body and raise your risk of many cancers. How can smoking tobacco affect me? Smoking tobacco puts you at risk for: Cancer. Smoking is most commonly associated with lung cancer, but can also lead to cancer in other parts of the body. Chronic  obstructive pulmonary disease (COPD). This is a long-term lung condition that makes it hard to breathe. It also gets worse over time. High blood pressure (hypertension), heart disease, stroke, or heart attack. Lung infections, such as pneumonia. Cataracts. This is when the lenses in the eyes become clouded. Digestive problems. This may include peptic ulcers, heartburn, and gastroesophageal reflux disease (GERD). Oral health problems, such as gum disease and tooth loss. Loss of taste and smell. Smoking can affect your appearance by causing: Wrinkles. Yellow or stained teeth, fingers, and fingernails. Smoking tobacco can also affect your social life, because: It may be challenging to find places to smoke when away from home. Many workplaces, [employer], hotels, and public places are tobacco-free. Smoking is expensive. This is due to the cost of tobacco and the long-term costs of treating health problems from smoking. Secondhand smoke may affect those around you. Secondhand smoke can cause lung cancer, breathing problems, and heart disease. Children of smokers have a higher risk for: Sudden infant death syndrome (SIDS). Ear infections. Lung infections. If you currently smoke tobacco, quitting now can help you: Lead a longer and healthier life. Look, smell, breathe, and feel better over  time. Save money. Protect others from the harms of secondhand smoke. What actions can I take to prevent health problems? Quit smoking  Do not start smoking. Quit if you already do. Make a plan to quit smoking and commit to it. Look for programs to help you and ask your health care provider for recommendations and ideas. Set a date and write down all the reasons you want to quit. Let your friends and family know you are quitting so they can help and support you. Consider finding friends who also want to quit. It can be easier to quit with someone else, so that you can support each other. Talk with your health  care provider about using nicotine replacement medicines to help you quit, such as gum, lozenges, patches, sprays, or pills. Do not replace cigarette smoking with electronic cigarettes, which are commonly called e-cigarettes. The safety of e-cigarettes is not known, and some may contain harmful chemicals. If you try to quit but return to smoking, stay positive. It is common to slip up when you first quit, so take it one day at a time. Be prepared for cravings. When you feel the urge to smoke, chew gum or suck on hard candy. Lifestyle Stay busy and take care of your body. Drink enough fluid to keep your urine pale yellow. Get plenty of exercise and eat a healthy diet. This can help prevent weight gain after quitting. Monitor your eating habits. Quitting smoking can cause you to have a larger appetite than when you smoke. Find ways to relax. Go out with friends or family to a movie or a restaurant where people do not smoke. Ask your health care provider about having regular tests (screenings) to check for cancer. This may include blood tests, imaging tests, and other tests. Find ways to manage your stress, such as meditation, yoga, or exercise. Where to find support To get support to quit smoking, consider: Asking your health care provider for more information and resources. Taking classes to learn more about quitting smoking. Looking for local organizations that offer resources about quitting smoking. Joining a support group for people who want to quit smoking in your local community. Calling the smokefree.gov counselor helpline: 1-800-Quit-Now 629-316-5619) Where to find more information You may find more information about quitting smoking from: HelpGuide.org: www.helpguide.org https://hall.com/: smokefree.gov American Lung Association: www.lung.org Contact a health care provider if you: Have problems breathing. Notice that your lips, nose, or fingers turn blue. Have chest pain. Are  coughing up blood. Feel faint or you pass out. Have other health changes that cause you to worry. Summary Smoking tobacco can negatively affect your health, the health of those around you, your finances, and your social life. Do not start smoking. Quit if you already do. If you need help quitting, ask your health care provider. Think about joining a support group for people who want to quit smoking in your local community. There are many effective programs that will help you to quit this behavior. This information is not intended to replace advice given to you by your health care provider. Make sure you discuss any questions you have with your health care provider. Document Revised: 12/17/2018 Document Reviewed: 04/08/2016 Elsevier Patient Education  2020 [employer].

## 2020-03-26 NOTE — Progress Notes (Signed)
Subjective:     Marissa Crawford is a 39 y.o. female and is here for a comprehensive physical exam. The patient reports problems - palpitations .----on occasion--- only a quick flutter   No cp, no sob, no ha or dizziness.  Pt also c/o brown spot on her tongue that has gotten bigger -- dentist was not sure what it was but did not think it was cancer.  Pt is concerned because it has changed.   No other complaint s  Social History   Socioeconomic History  . Marital status: Single    Spouse name: Not on file  . Number of children: Not on file  . Years of education: Not on file  . Highest education level: Not on file  Occupational History  . Occupation: Therapist, sports --Med surg    Employer: [employer]  Tobacco Use  . Smoking status: Current Every Day Smoker    Packs/day: 1.00    Years: 12.00    Pack years: 12.00    Types: Cigarettes    Last attempt to quit: 08/06/2015    Years since quitting: 4.6  . Smokeless tobacco: Never Used  Substance and Sexual Activity  . Alcohol use: Yes    Alcohol/week: 2.0 standard drinks    Types: 2 Standard drinks or equivalent per week  . Drug use: No  . Sexual activity: Yes    Partners: Male    Birth control/protection: Condom, None  Other Topics Concern  . Not on file  Social History Narrative   Exercise ---cardio   Social Determinants of Health   Financial Resource Strain: Not on file  Food Insecurity: Not on file  Transportation Needs: Not on file  Physical Activity: Not on file  Stress: Not on file  Social Connections: Not on file  Intimate Partner Violence: Not on file   Health Maintenance  Topic Date Due  . Samul Dada  04/08/2015  . PAP SMEAR-Modifier  06/05/2020  . INFLUENZA VACCINE  Completed  . COVID-19 Vaccine  Completed  . Hepatitis C Screening  Completed  . HIV Screening  Completed    The following portions of the patient's history were reviewed and updated as appropriate:  She  has a past medical history of HTN (hypertension). She  does not have any pertinent problems on file. She  has no past surgical history on file. Her family history includes Alcohol abuse in her father; Cancer (age of onset: 69) in her mother; Cervical cancer in her mother; Diabetes in her brother, maternal grandfather, maternal grandmother, paternal grandfather, and paternal grandmother; Gout in her father; Hypertension in her father; Seizures in her sister; Stroke in her maternal grandfather and paternal grandmother. She  reports that she has been smoking cigarettes. She has a 12.00 pack-year smoking history. She has never used smokeless tobacco. She reports current alcohol use of about 2.0 standard drinks of alcohol per week. She reports that she does not use drugs. She has a current medication list which includes the following prescription(s): losartan-hydrochlorothiazide and metoprolol succinate. No current outpatient medications on file prior to visit.   No current facility-administered medications on file prior to visit.   She has No Known Allergies..  Review of Systems Review of Systems  Constitutional: Negative for activity change, appetite change and fatigue.  HENT: Negative for hearing loss, congestion, tinnitus and ear discharge.  dentist q76m Eyes: Negative for visual disturbance (see optho q1y -- vision corrected to 20/20 with glasses).  Respiratory: Negative for cough, chest tightness and shortness  of breath.   Cardiovascular: Negative for chest pain,  and leg swelling. +palpitations Gastrointestinal: Negative for abdominal pain, diarrhea, constipation and abdominal distention.  Genitourinary: Negative for urgency, frequency, decreased urine volume and difficulty urinating.  Musculoskeletal: Negative for back pain, arthralgias and gait problem.  Skin: Negative for color change, pallor and rash.  Neurological: Negative for dizziness, light-headedness, numbness and headaches.  Hematological: Negative for adenopathy. Does not bruise/bleed  easily.  Psychiatric/Behavioral: Negative for suicidal ideas, confusion, sleep disturbance, self-injury, dysphoric mood, decreased concentration and agitation.       Objective:    BP (!) 150/100 (BP Location: Left Arm, Patient Position: Sitting, Cuff Size: Normal)   Pulse 95   Temp 98.2 F (36.8 C) (Oral)   Resp 18   Ht 5\' 1"  (1.549 m)   Wt 186 lb 12.8 oz (84.7 kg)   LMP 03/08/2020   SpO2 99%   BMI 35.30 kg/m  General appearance: alert, cooperative, appears stated age and no distress Head: Normocephalic, without obvious abnormality, atraumatic Eyes: conjunctivae/corneas clear. PERRL, EOM's intact. Fundi benign. Ears: normal TM's and external ear canals both ears Nose: Nares normal. Septum midline. Mucosa normal. No drainage or sinus tenderness. Throat: abnormal findings: brown spot -- 1/2 cm diameter Left side tongue Neck: no adenopathy, no carotid bruit, no JVD, supple, symmetrical, trachea midline and thyroid not enlarged, symmetric, no tenderness/mass/nodules Back: symmetric, no curvature. ROM normal. No CVA tenderness. Lungs: clear to auscultation bilaterally Breasts: gyn Heart: regular rate and rhythm, S1, S2 normal, no murmur, click, rub or gallop Abdomen: soft, non-tender; bowel sounds normal; no masses,  no organomegaly Pelvic: deferred--gyn Extremities: extremities normal, atraumatic, no cyanosis or edema Pulses: 2+ and symmetric Skin: Skin color, texture, turgor normal. No rashes or lesions Lymph nodes: Cervical, supraclavicular, and axillary nodes normal. Neurologic: Alert and oriented X 3, normal strength and tone. Normal symmetric reflexes. Normal coordination and gait    Assessment:    Healthy female exam.      Plan:    ghm utd-- getting covid booster today Check labs See After Visit Summary for Counseling Recommendations    1. Primary hypertension Poorly controlled will alter medications, encouraged DASH diet, minimize caffeine and obtain adequate  sleep. Report concerning symptoms and follow up as directed and as needed---- inc metoprolol to 100 mg , con't losartan/ hct  - metoprolol succinate (TOPROL-XL) 100 MG 24 hr tablet; Take 1 tablet (100 mg total) by mouth daily. Take with or immediately following a meal.  Dispense: 90 tablet; Refill: 3  2. Essential hypertension See above - losartan-hydrochlorothiazide (HYZAAR) 100-25 MG tablet; Take 1 tablet by mouth daily.  Dispense: 90 tablet; Refill: 3 - ECHOCARDIOGRAM COMPLETE; Future  3. Preventative health care See above - TSH - Lipid panel - CBC with Differential/Platelet - Comprehensive metabolic panel  4. Tongue lesion Refer to ent  - Ambulatory referral to ENT  5. Palpitation Check labs  ekg-- ns t wave changes--- check echo Consider cardiology - EKG 12-Lead - ECHOCARDIOGRAM COMPLETE; Future

## 2020-03-29 ENCOUNTER — Other Ambulatory Visit: Payer: Self-pay | Admitting: Family Medicine

## 2020-03-29 DIAGNOSIS — E785 Hyperlipidemia, unspecified: Secondary | ICD-10-CM

## 2020-04-02 MED FILL — LOSARTAN-HCTZ 100-25 MG TAB: 100-25 | 90 days supply | Qty: 90 | Fill #0

## 2020-04-04 ENCOUNTER — Ambulatory Visit (HOSPITAL_COMMUNITY): Admission: EM | Admit: 2020-04-04 | Discharge: 2020-04-04 | Discharge status: Against Medical Advice | Payer: 59

## 2020-04-04 ENCOUNTER — Other Ambulatory Visit: Payer: Self-pay

## 2020-04-05 ENCOUNTER — Ambulatory Visit
Admission: EM | Admit: 2020-04-05 | Discharge: 2020-04-05 | Disposition: A | Discharge status: Home / Self Care | Payer: 59 | Attending: Emergency Medicine | Admitting: Emergency Medicine

## 2020-04-05 ENCOUNTER — Ambulatory Visit (HOSPITAL_COMMUNITY): Admission: RE | Admit: 2020-04-05 | Payer: 59 | Source: Ambulatory Visit

## 2020-04-05 ENCOUNTER — Other Ambulatory Visit: Payer: Self-pay | Admitting: Emergency Medicine

## 2020-04-05 DIAGNOSIS — R6884 Jaw pain: Secondary | ICD-10-CM | POA: Diagnosis not present

## 2020-04-05 MED ORDER — NAPROXEN 500 MG PO TABS: 500.0000 mg | ORAL_TABLET | Freq: Two times a day (BID) | ORAL | 0 refills | Status: DC

## 2020-04-05 MED ORDER — AMOXICILLIN-POT CLAVULANATE 875-125 MG PO TABS: 1.0000 | ORAL_TABLET | Freq: Two times a day (BID) | ORAL | 0 refills | Status: DC

## 2020-04-05 MED FILL — NAPROXEN 500 MG TABLET: 500 | 15 days supply | Qty: 30 | Fill #0

## 2020-04-05 MED FILL — AMOX-CLAV 875-125 MG TABLET: 875-125 | 7 days supply | Qty: 14 | Fill #0

## 2020-04-05 NOTE — Discharge Instructions (Signed)
Augmentin twice daily for 1 week Continue anti-inflammatories, may try naprosyn as alternative to ibuprofen Warm compresses Follow up if not improving or worsening

## 2020-04-05 NOTE — ED Provider Notes (Signed)
[facility]    CSN: [id number] Arrival date & time: 04/05/20  0850      History   Chief Complaint Chief Complaint  Patient presents with  . Jaw Pain  . In car [id number]    HPI Marissa Crawford is a 39 y.o. female history of hypertension presenting today for evaluation of jaw pain.  Patient stated she has had right-sided jaw pain and to head as well as into throat.  Has associated sore throat.  Pain intermittent.  Some improvement with ibuprofen. Reports prior wisdom tooth removal. Start 4 days ago. 1 episode of vomiting yesterday. Denies associated swelling. Heating pad. Reports worse with neck rotation. Denies worsening with talking.   HPI  Past Medical History:  Diagnosis Date  . HTN (hypertension)     Patient Active Problem List   Diagnosis Date Noted  . Obesity (BMI 30-39.9) 02/07/2019  . HTN (hypertension) 12/23/2016  . Indication for care in labor or delivery 03/25/2016  . SVD (spontaneous vaginal delivery) 03/25/2016    No past surgical history on file.  OB History    Gravida  1   Para  1   Term  1   Preterm      AB      Living  1     SAB      IAB      Ectopic      Multiple  0   Live Births  1            Home Medications    Prior to Admission medications   Medication Sig Start Date End Date Taking? Authorizing Provider  amoxicillin-clavulanate (AUGMENTIN) 875-125 MG tablet Take 1 tablet by mouth every 12 (twelve) hours for 7 days. 04/05/20 04/12/20 Yes Jayline Kilburg C, PA-C  naproxen (NAPROSYN) 500 MG tablet Take 1 tablet (500 mg total) by mouth 2 (two) times daily. 04/05/20  Yes Maridee Slape C, PA-C  losartan-hydrochlorothiazide (HYZAAR) 100-25 MG tablet Take 1 tablet by mouth daily. 03/26/20   Ann Held, DO  metoprolol succinate (TOPROL-XL) 100 MG 24 hr tablet Take 1 tablet (100 mg total) by mouth daily. Take with or immediately following a meal. 03/26/20   Carollee Herter, Alferd Apa, DO    Family  History Family History  Problem Relation Age of Onset  . Cervical cancer Mother   . Cancer Mother 20       cervical  . Hypertension Father   . Alcohol abuse Father   . Gout Father   . Seizures Sister   . Diabetes Brother   . Stroke Maternal Grandfather   . Diabetes Maternal Grandfather   . Stroke Paternal Grandmother   . Diabetes Paternal Grandmother   . Diabetes Maternal Grandmother   . Diabetes Paternal Grandfather     Social History Social History   Tobacco Use  . Smoking status: Current Every Day Smoker    Packs/day: 1.00    Years: 12.00    Pack years: 12.00    Types: Cigarettes    Last attempt to quit: 08/06/2015    Years since quitting: 4.6  . Smokeless tobacco: Never Used  Substance Use Topics  . Alcohol use: Yes    Alcohol/week: 2.0 standard drinks    Types: 2 Standard drinks or equivalent per week  . Drug use: No     Allergies   Patient has no known allergies.   Review of Systems Review of Systems  Constitutional: Negative for activity change, appetite change,  chills, fatigue and fever.  HENT: Positive for sore throat. Negative for congestion, ear pain, rhinorrhea, sinus pressure and trouble swallowing.   Eyes: Negative for discharge and redness.  Respiratory: Negative for cough, chest tightness and shortness of breath.   Cardiovascular: Negative for chest pain.  Gastrointestinal: Negative for abdominal pain, diarrhea, nausea and vomiting.  Musculoskeletal: Negative for myalgias.  Skin: Negative for rash.  Neurological: Positive for headaches. Negative for dizziness and light-headedness.     Physical Exam Triage Vital Signs ED Triage Vitals  Enc Vitals Group     BP 04/05/20 0927 (!) 147/83     Pulse Rate 04/05/20 0927 99     Resp 04/05/20 0927 19     Temp 04/05/20 0927 98 F (36.7 C)     Temp src --      SpO2 04/05/20 0927 99 %     Weight --      Height --      Head Circumference --      Peak Flow --      Pain Score 04/05/20 0926 0      Pain Loc --      Pain Edu? --      Excl. in GC? --    No data found.  Updated Vital Signs BP (!) 147/83   Pulse 99   Temp 98 F (36.7 C)   Resp 19   LMP 03/08/2020   SpO2 99%   Visual Acuity Right Eye Distance:   Left Eye Distance:   Bilateral Distance:    Right Eye Near:   Left Eye Near:    Bilateral Near:     Physical Exam Vitals and nursing note reviewed.  Constitutional:      Appearance: She is well-developed and well-nourished.     Comments: No acute distress  HENT:     Head: Normocephalic and atraumatic.     Comments: Nontender to palpation over TMJ bilaterally, nontender temporal areas bilaterally    Nose: Nose normal.     Mouth/Throat:     Comments: Oral mucosa pink and moist, no tonsillar enlargement or exudate. Posterior pharynx patent and nonerythematous, no uvula deviation or swelling. Normal phonation. Eyes:     Conjunctiva/sclera: Conjunctivae normal.  Neck:     Comments: Full active range of motion of neck, mid anterior cervical lymphadenopathy on right with some tenderness, no overlying erythema Cardiovascular:     Rate and Rhythm: Normal rate.  Pulmonary:     Effort: Pulmonary effort is normal. No respiratory distress.     Comments: Breathing comfortably at rest, CTABL, no wheezing, rales or other adventitious sounds auscultated Abdominal:     General: There is no distension.  Musculoskeletal:        General: Normal range of motion.     Cervical back: Neck supple.  Skin:    General: Skin is warm and dry.  Neurological:     Mental Status: She is alert and oriented to person, place, and time.  Psychiatric:        Mood and Affect: Mood and affect normal.      UC Treatments / Results  Labs (all labs ordered are listed, but only abnormal results are displayed) Labs Reviewed - No data to display  EKG   Radiology No results found.  Procedures Procedures (including critical care time)  Medications Ordered in UC Medications - No data  to display  Initial Impression / Assessment and Plan / UC Course  I have reviewed the triage vital  signs and the nursing notes.  Pertinent labs & imaging results that were available during my care of the patient were reviewed by me and considered in my medical decision making (see chart for details).     TMJ versus sialadenitis versus underlying dental infection.  Opting to treat with Augmentin to cover for deeper infection not visualized on exam given associated lymphadenopathy, continue anti-inflammatories warm compresses.  Discussed strict return precautions. Patient verbalized understanding and is agreeable with plan.  Final Clinical Impressions(s) / UC Diagnoses   Final diagnoses:  Jaw pain     Discharge Instructions     Augmentin twice daily for 1 week Continue anti-inflammatories, may try naprosyn as alternative to ibuprofen Warm compresses Follow up if not improving or worsening    ED Prescriptions    Medication Sig Dispense Auth. Provider   amoxicillin-clavulanate (AUGMENTIN) 875-125 MG tablet Take 1 tablet by mouth every 12 (twelve) hours for 7 days. 14 tablet Katori Wirsing C, PA-C   naproxen (NAPROSYN) 500 MG tablet Take 1 tablet (500 mg total) by mouth 2 (two) times daily. 30 tablet Matilde Pottenger, [place] C, PA-C     PDMP not reviewed this encounter.   Elie Leppo, [place] C, PA-C 04/05/20 1031

## 2020-04-05 NOTE — ED Triage Notes (Signed)
Pt presents with complaints of right sided jaw pain that shoots up to her head and down into her throat giving her a sore throat. Feels like the pain is in the joint with tenderness in her lymph nodes on the right side. Reports relief with ibuprofen. Pain comes and goes. Concerned for cardiac issues as well.

## 2020-04-12 ENCOUNTER — Other Ambulatory Visit: Payer: Self-pay

## 2020-04-12 ENCOUNTER — Ambulatory Visit (HOSPITAL_COMMUNITY)
Admission: RE | Admit: 2020-04-12 | Discharge: 2020-04-12 | Disposition: A | Discharge status: Home / Self Care | Payer: 59 | Source: Ambulatory Visit | Attending: Family Medicine | Admitting: Family Medicine

## 2020-04-12 DIAGNOSIS — R002 Palpitations: Secondary | ICD-10-CM | POA: Diagnosis not present

## 2020-04-12 DIAGNOSIS — F17210 Nicotine dependence, cigarettes, uncomplicated: Secondary | ICD-10-CM | POA: Insufficient documentation

## 2020-04-12 DIAGNOSIS — R9431 Abnormal electrocardiogram [ECG] [EKG]: Secondary | ICD-10-CM | POA: Insufficient documentation

## 2020-04-12 DIAGNOSIS — I10 Essential (primary) hypertension: Secondary | ICD-10-CM | POA: Diagnosis not present

## 2020-04-12 LAB — ECHOCARDIOGRAM COMPLETE
Area-P 1/2: 3.65 cm2
Calc EF: 57 %
S' Lateral: 2.8 cm
Single Plane A2C EF: 58 %
Single Plane A4C EF: 56 %

## 2020-04-12 NOTE — Progress Notes (Signed)
  Echocardiogram 2D Echocardiogram has been performed.  Burnard Hawthorne 04/12/2020, 3:31 PM

## 2020-04-16 ENCOUNTER — Ambulatory Visit: Payer: 59 | Admitting: Family Medicine

## 2020-04-20 ENCOUNTER — Ambulatory Visit (INDEPENDENT_AMBULATORY_CARE_PROVIDER_SITE_OTHER): Payer: 59 | Admitting: Otolaryngology

## 2020-04-20 ENCOUNTER — Other Ambulatory Visit: Payer: Self-pay

## 2020-04-20 ENCOUNTER — Encounter (INDEPENDENT_AMBULATORY_CARE_PROVIDER_SITE_OTHER): Payer: Self-pay | Admitting: Otolaryngology

## 2020-04-20 VITALS — Temp 97.3°F

## 2020-04-20 DIAGNOSIS — K148 Other diseases of tongue: Secondary | ICD-10-CM | POA: Diagnosis not present

## 2020-04-20 NOTE — Progress Notes (Signed)
HPI: Marissa Crawford is a 40 y.o. female who presents is referred by her PCP Dr. Etter Sjogren for evaluation of a lesion she has had on her tongue for a number of years.  She feels like it is gotten larger.  It is not sore or symptomatic otherwise.Marland Kitchen  Past Medical History:  Diagnosis Date  . HTN (hypertension)    No past surgical history on file. Social History   Socioeconomic History  . Marital status: Single    Spouse name: Not on file  . Number of children: Not on file  . Years of education: Not on file  . Highest education level: Not on file  Occupational History  . Occupation: Therapist, sports --Med surg    Employer: [employer]  Tobacco Use  . Smoking status: Current Every Day Smoker    Packs/day: 1.00    Years: 20.00    Pack years: 20.00    Types: Cigarettes    Start date: 2001  . Smokeless tobacco: Never Used  Substance and Sexual Activity  . Alcohol use: Yes    Alcohol/week: 2.0 standard drinks    Types: 2 Standard drinks or equivalent per week  . Drug use: No  . Sexual activity: Yes    Partners: Male    Birth control/protection: Condom, None  Other Topics Concern  . Not on file  Social History Narrative   Exercise ---cardio   Social Determinants of Health   Financial Resource Strain: Not on file  Food Insecurity: Not on file  Transportation Needs: Not on file  Physical Activity: Not on file  Stress: Not on file  Social Connections: Not on file   Family History  Problem Relation Age of Onset  . Cervical cancer Mother   . Cancer Mother 23       cervical  . Hypertension Father   . Alcohol abuse Father   . Gout Father   . Seizures Sister   . Diabetes Brother   . Stroke Maternal Grandfather   . Diabetes Maternal Grandfather   . Stroke Paternal Grandmother   . Diabetes Paternal Grandmother   . Diabetes Maternal Grandmother   . Diabetes Paternal Grandfather    No Known Allergies Prior to Admission medications   Medication Sig Start Date End Date Taking? Authorizing  Provider  losartan-hydrochlorothiazide (HYZAAR) 100-25 MG tablet Take 1 tablet by mouth daily. 03/26/20   Ann Held, DO  metoprolol succinate (TOPROL-XL) 100 MG 24 hr tablet Take 1 tablet (100 mg total) by mouth daily. Take with or immediately following a meal. 03/26/20   Carollee Herter, Alferd Apa, DO  naproxen (NAPROSYN) 500 MG tablet Take 1 tablet (500 mg total) by mouth 2 (two) times daily. 04/05/20   Wieters, Hallie C, PA-C     Positive ROS: Otherwise negative  All other systems have been reviewed and were otherwise negative with the exception of those mentioned in the HPI and as above.  Physical Exam: Constitutional: Alert, well-appearing, no acute distress Ears: External ears without lesions or tenderness. Ear canals are clear bilaterally with intact, clear TMs.  Nasal: External nose without lesions.. Clear nasal passages Oral: Lips and gums without lesions. Tongue and palate mucosa without lesions. Posterior oropharynx clear.  On examination of the tongue patient has a small 3 mm pigmented lesion of the left lateral anterior tongue.  This is soft to palpation and nontender with no induration and is consistent with a benign pigmented lesion. Neck: No palpable adenopathy or masses Respiratory: Breathing comfortably  Skin: No facial/neck lesions or rash noted.  Procedures  Assessment: Benign pigmented lesion of the left anterior lateral tongue  Plan: Discussed with patient concerning follow-up if she develops any soreness or pain associated with this or if it becomes firm.   Radene Journey, MD   CC:

## 2020-04-30 ENCOUNTER — Encounter: Payer: Self-pay | Admitting: Family Medicine

## 2020-04-30 NOTE — Telephone Encounter (Signed)
You are correct--- she has to be seen to get CT If she is getting worse she should go to ER

## 2020-04-30 NOTE — Telephone Encounter (Signed)
She should go to ER

## 2020-05-01 ENCOUNTER — Encounter: Payer: Self-pay | Admitting: Family Medicine

## 2020-05-01 ENCOUNTER — Other Ambulatory Visit: Payer: Self-pay

## 2020-05-01 ENCOUNTER — Telehealth (INDEPENDENT_AMBULATORY_CARE_PROVIDER_SITE_OTHER): Payer: 59 | Admitting: Family Medicine

## 2020-05-01 VITALS — BP 120/81 | HR 95

## 2020-05-01 DIAGNOSIS — R42 Dizziness and giddiness: Secondary | ICD-10-CM | POA: Diagnosis not present

## 2020-05-01 DIAGNOSIS — R519 Headache, unspecified: Secondary | ICD-10-CM | POA: Diagnosis not present

## 2020-05-01 NOTE — Patient Instructions (Signed)
Vitals:   05/01/20 1038  BP: 120/81  Pulse: 95

## 2020-05-01 NOTE — Progress Notes (Deleted)
Patient ID: Marissa Crawford, female    DOB: 21-Jan-1981  Age: 40 y.o. MRN: [id number]    Subjective:  Subjective  HPI Marissa Crawford presents for headache since yesterday.  She woke up and could not walk straight   Review of Systems  History Past Medical History:  Diagnosis Date  . HTN (hypertension)     She has no past surgical history on file.   Her family history includes Alcohol abuse in her father; Cancer (age of onset: 43) in her mother; Cervical cancer in her mother; Diabetes in her brother, maternal grandfather, maternal grandmother, paternal grandfather, and paternal grandmother; Gout in her father; Hypertension in her father; Seizures in her sister; Stroke in her maternal grandfather and paternal grandmother.She reports that she has been smoking cigarettes. She started smoking about 21 years ago. She has a 20.00 pack-year smoking history. She has never used smokeless tobacco. She reports current alcohol use of about 2.0 standard drinks of alcohol per week. She reports that she does not use drugs.  Current Outpatient Medications on File Prior to Visit  Medication Sig Dispense Refill  . losartan-hydrochlorothiazide (HYZAAR) 100-25 MG tablet Take 1 tablet by mouth daily. 90 tablet 3  . metoprolol succinate (TOPROL-XL) 100 MG 24 hr tablet Take 1 tablet (100 mg total) by mouth daily. Take with or immediately following a meal. 90 tablet 3  . naproxen (NAPROSYN) 500 MG tablet Take 1 tablet (500 mg total) by mouth 2 (two) times daily. 30 tablet 0  . valACYclovir (VALTREX) 500 MG tablet valacyclovir 500 mg tablet  TK 1 T PO QD    . metoprolol succinate (TOPROL-XL) 50 MG 24 hr tablet metoprolol succinate ER 50 mg tablet,extended release 24 hr  TK 1 T PO QD. TK WITH OR IMMEDIATELY FOLLOWING A MEAL (Patient not taking: Reported on 05/01/2020)     No current facility-administered medications on file prior to visit.     Objective:  Objective  Physical Exam BP 120/81 (BP Location: Left Arm,  Patient Position: Sitting, Cuff Size: Normal)   Pulse 95  Wt Readings from Last 3 Encounters:  03/26/20 186 lb 12.8 oz (84.7 kg)  03/01/19 178 lb 6.4 oz (80.9 kg)  02/07/19 177 lb 3.2 oz (80.4 kg)     Lab Results  Component Value Date   WBC 6.2 03/26/2020   HGB 13.6 03/26/2020   HCT 39.8 03/26/2020   PLT 251.0 03/26/2020   GLUCOSE 76 03/26/2020   CHOL 223 (H) 03/26/2020   TRIG 276.0 (H) 03/26/2020   HDL 42.40 03/26/2020   LDLDIRECT 148.0 03/26/2020   LDLCALC 180 (H) 02/07/2019   ALT 76 (H) 03/26/2020   AST 44 (H) 03/26/2020   NA 138 03/26/2020   K 3.8 03/26/2020   CL 103 03/26/2020   CREATININE 0.72 03/26/2020   BUN 12 03/26/2020   CO2 26 03/26/2020   TSH 3.56 03/26/2020    ECHOCARDIOGRAM COMPLETE  Result Date: 04/12/2020    ECHOCARDIOGRAM REPORT   Patient Name:   Marissa Crawford Date of Exam: 04/12/2020 Medical Rec #:  [id number]    Height:       61.0 in Accession #:    [id number]   Weight:       186.8 lb Date of Birth:  10-Dec-1980     BSA:          1.835 m Patient Age:    51 years     BP:  147/83 mmHg Patient Gender: F            HR:           90 bpm. Exam Location:  Outpatient Procedure: 2D Echo, Cardiac Doppler and Color Doppler Indications:    Palpitations 785.1 / R00.2                 Abnormal ECG R94.31  History:        Patient has no prior history of Echocardiogram examinations.                 Risk Factors:Hypertension and Current Smoker.  Sonographer:    Vickie Epley RDCS Referring Phys: [place]  1. Left ventricular ejection fraction, by estimation, is 60 to 65%. The left ventricle has normal function. The left ventricle has no regional wall motion abnormalities. Left ventricular diastolic parameters were normal.  2. Right ventricular systolic function is normal. The right ventricular size is normal. Tricuspid regurgitation signal is inadequate for assessing PA pressure.  3. The mitral valve is normal in structure. No evidence of mitral valve  regurgitation.  4. The aortic valve was not well visualized. Aortic valve regurgitation is not visualized.  5. The inferior vena cava is normal in size with greater than 50% respiratory variability, suggesting right atrial pressure of 3 mmHg. Conclusion(s)/Recommendation(s): Normal biventricular function without evidence of hemodynamically significant valvular heart disease. FINDINGS  Left Ventricle: Left ventricular ejection fraction, by estimation, is 60 to 65%. The left ventricle has normal function. The left ventricle has no regional wall motion abnormalities. The left ventricular internal cavity size was normal in size. There is  no left ventricular hypertrophy. Left ventricular diastolic parameters were normal. Right Ventricle: The right ventricular size is normal. No increase in right ventricular wall thickness. Right ventricular systolic function is normal. Tricuspid regurgitation signal is inadequate for assessing PA pressure. Left Atrium: Left atrial size was normal in size. Right Atrium: Right atrial size was normal in size. Pericardium: There is no evidence of pericardial effusion. Mitral Valve: The mitral valve is normal in structure. No evidence of mitral valve regurgitation. Tricuspid Valve: The tricuspid valve is normal in structure. Tricuspid valve regurgitation is not demonstrated. Aortic Valve: The aortic valve was not well visualized. Aortic valve regurgitation is not visualized. Pulmonic Valve: The pulmonic valve was normal in structure. Pulmonic valve regurgitation is not visualized. Aorta: The aortic root is normal in size and structure. Venous: The inferior vena cava is normal in size with greater than 50% respiratory variability, suggesting right atrial pressure of 3 mmHg. IAS/Shunts: The atrial septum is grossly normal.  LEFT VENTRICLE PLAX 2D LVIDd:         4.20 cm     Diastology LVIDs:         2.80 cm     LV e' medial:    8.49 cm/s LV PW:         0.70 cm     LV E/e' medial:  9.5 LV IVS:         0.70 cm     LV e' lateral:   9.25 cm/s LVOT diam:     1.80 cm     LV E/e' lateral: 8.7 LV SV:         49 LV SV Index:   27 LVOT Area:     2.54 cm  LV Volumes (MOD) LV vol d, MOD A2C: 75.7 ml LV vol d, MOD A4C: 77.1 ml LV vol  s, MOD A2C: 31.8 ml LV vol s, MOD A4C: 33.9 ml LV SV MOD A2C:     43.9 ml LV SV MOD A4C:     77.1 ml LV SV MOD BP:      44.4 ml RIGHT VENTRICLE RV S prime:     11.50 cm/s TAPSE (M-mode): 1.6 cm LEFT ATRIUM           Index       RIGHT ATRIUM          Index LA diam:      2.80 cm 1.53 cm/m  RA Area:     8.00 cm LA Vol (A2C): 31.4 ml 17.11 ml/m RA Volume:   14.10 ml 7.69 ml/m LA Vol (A4C): 10.4 ml 5.67 ml/m  AORTIC VALVE LVOT Vmax:   103.00 cm/s LVOT Vmean:  77.300 cm/s LVOT VTI:    0.192 m  AORTA Ao Root diam: 2.40 cm MITRAL VALVE MV Area (PHT): 3.65 cm    SHUNTS MV Decel Time: 208 msec    Systemic VTI:  0.19 m MV E velocity: 80.50 cm/s  Systemic Diam: 1.80 cm MV A velocity: 58.80 cm/s MV E/A ratio:  1.37 Rudean Haskell MD Electronically signed by Rudean Haskell MD Signature Date/Time: 04/12/2020/3:52:08 PM    Final      Assessment & Plan:  Plan  I am having Marissa Crawford maintain her metoprolol succinate, losartan-hydrochlorothiazide, naproxen, metoprolol succinate, and valACYclovir.  No orders of the defined types were placed in this encounter.   Problem List Items Addressed This Visit   None     Follow-up: No follow-ups on file.  Ann Held, DO

## 2020-05-01 NOTE — Progress Notes (Addendum)
Virtual Visit via Video Note  I connected with Marissa Crawford on 05/01/20 at 10:40 AM EST by a video enabled telemedicine application and verified that I am speaking with the correct person using two identifiers.  Location participants in visit  Patient: at work at [employer]: office    I discussed the limitations of evaluation and management by telemedicine and the availability of in person appointments. The patient expressed understanding and agreed to proceed.  History of Present Illness: Pt is at work and c/o headache --- she woke up yesterday am with cold sweat and feeling dizzy with headache R side temple and forehead.  Tylenol helped Pt feels better today.   She also had heaviness in leg --- better today   No slurred speech or weakness in limbs Speech normal Pt did not go to er because wait was over 24 hours and she feels better now  She still has a headache and is worried about that  No vision changes    Observations/Objective: Vitals:   05/01/20 1038  BP: 120/81  Pulse: 95   Normal speech Pt is in NAD  Assessment and Plan:  1. Nonintractable episodic headache, unspecified headache type Mri brain Check labs If symptoms worsen -- go to ER  - CBC with Differential/Platelet; Future - Comprehensive metabolic panel; Future - MR Brain Wo Contrast; Future - Sedimentation rate; Future - TSH; Future - Vitamin B12; Future  2. Dizziness   - CBC with Differential/Platelet; Future - Comprehensive metabolic panel; Future - Sedimentation rate; Future - TSH; Future - Vitamin B12; Future  Follow Up Instructions:    I discussed the assessment and treatment plan with the patient. The patient was provided an opportunity to ask questions and all were answered. The patient agreed with the plan and demonstrated an understanding of the instructions.   The patient was advised to call back or seek an in-person evaluation if the symptoms worsen or if the condition fails to  improve as anticipated.  I provided 25 minutes of non-face-to-face time during this encounter.   Ann Held, DO

## 2020-05-04 ENCOUNTER — Other Ambulatory Visit (INDEPENDENT_AMBULATORY_CARE_PROVIDER_SITE_OTHER): Payer: 59

## 2020-05-04 ENCOUNTER — Other Ambulatory Visit: Payer: Self-pay

## 2020-05-04 DIAGNOSIS — R519 Headache, unspecified: Secondary | ICD-10-CM | POA: Diagnosis not present

## 2020-05-04 DIAGNOSIS — R42 Dizziness and giddiness: Secondary | ICD-10-CM | POA: Diagnosis not present

## 2020-05-04 LAB — CBC WITH DIFFERENTIAL/PLATELET
Basophils Absolute: 0 10*3/uL (ref 0.0–0.1)
Basophils Relative: 0.6 % (ref 0.0–3.0)
Eosinophils Absolute: 0.2 10*3/uL (ref 0.0–0.7)
Eosinophils Relative: 2.1 % (ref 0.0–5.0)
HCT: 37.1 % (ref 36.0–46.0)
Hemoglobin: 12.6 g/dL (ref 12.0–15.0)
Lymphocytes Relative: 23.7 % (ref 12.0–46.0)
Lymphs Abs: 1.9 10*3/uL (ref 0.7–4.0)
MCHC: 33.8 g/dL (ref 30.0–36.0)
MCV: 97.3 fl (ref 78.0–100.0)
Monocytes Absolute: 1 10*3/uL (ref 0.1–1.0)
Monocytes Relative: 12.5 % — ABNORMAL HIGH (ref 3.0–12.0)
Neutro Abs: 4.9 10*3/uL (ref 1.4–7.7)
Neutrophils Relative %: 61.1 % (ref 43.0–77.0)
Platelets: 332 10*3/uL (ref 150.0–400.0)
RBC: 3.82 Mil/uL — ABNORMAL LOW (ref 3.87–5.11)
RDW: 12.9 % (ref 11.5–15.5)
WBC: 8.1 10*3/uL (ref 4.0–10.5)

## 2020-05-04 LAB — COMPREHENSIVE METABOLIC PANEL WITH GFR
ALT: 37 U/L — ABNORMAL HIGH (ref 0–35)
AST: 38 U/L — ABNORMAL HIGH (ref 0–37)
Albumin: 4.3 g/dL (ref 3.5–5.2)
Alkaline Phosphatase: 70 U/L (ref 39–117)
BUN: 20 mg/dL (ref 6–23)
CO2: 28 meq/L (ref 19–32)
Calcium: 9.6 mg/dL (ref 8.4–10.5)
Chloride: 99 meq/L (ref 96–112)
Creatinine, Ser: 1.57 mg/dL — ABNORMAL HIGH (ref 0.40–1.20)
GFR: 41.58 mL/min — ABNORMAL LOW
Glucose, Bld: 103 mg/dL — ABNORMAL HIGH (ref 70–99)
Potassium: 3.4 meq/L — ABNORMAL LOW (ref 3.5–5.1)
Sodium: 136 meq/L (ref 135–145)
Total Bilirubin: 0.5 mg/dL (ref 0.2–1.2)
Total Protein: 7.8 g/dL (ref 6.0–8.3)

## 2020-05-04 LAB — VITAMIN B12: Vitamin B-12: 1116 pg/mL — ABNORMAL HIGH (ref 211–911)

## 2020-05-04 LAB — SEDIMENTATION RATE: Sed Rate: 121 mm/h — ABNORMAL HIGH (ref 0–20)

## 2020-05-04 LAB — TSH: TSH: 1.75 u[IU]/mL (ref 0.35–4.50)

## 2020-05-05 ENCOUNTER — Encounter (HOSPITAL_COMMUNITY): Payer: Self-pay | Admitting: Emergency Medicine

## 2020-05-05 ENCOUNTER — Emergency Department (HOSPITAL_COMMUNITY): Payer: 59

## 2020-05-05 ENCOUNTER — Other Ambulatory Visit: Payer: Self-pay

## 2020-05-05 ENCOUNTER — Inpatient Hospital Stay (HOSPITAL_COMMUNITY)
Admission: EM | Admit: 2020-05-05 | Discharge: 2020-05-08 | DRG: 064 | Disposition: A | Discharge status: Home / Self Care | Payer: 59 | Attending: Internal Medicine | Admitting: Internal Medicine

## 2020-05-05 ENCOUNTER — Ambulatory Visit (HOSPITAL_COMMUNITY)
Admission: RE | Admit: 2020-05-05 | Discharge: 2020-05-05 | Disposition: A | Discharge status: Home / Self Care | Payer: 59 | Source: Ambulatory Visit | Attending: Family Medicine | Admitting: Family Medicine

## 2020-05-05 DIAGNOSIS — I6501 Occlusion and stenosis of right vertebral artery: Secondary | ICD-10-CM | POA: Diagnosis present

## 2020-05-05 DIAGNOSIS — E876 Hypokalemia: Secondary | ICD-10-CM

## 2020-05-05 DIAGNOSIS — Z823 Family history of stroke: Secondary | ICD-10-CM

## 2020-05-05 DIAGNOSIS — I1A0 Resistant hypertension: Secondary | ICD-10-CM | POA: Diagnosis present

## 2020-05-05 DIAGNOSIS — Z79899 Other long term (current) drug therapy: Secondary | ICD-10-CM

## 2020-05-05 DIAGNOSIS — Z6835 Body mass index (BMI) 35.0-35.9, adult: Secondary | ICD-10-CM

## 2020-05-05 DIAGNOSIS — Z8049 Family history of malignant neoplasm of other genital organs: Secondary | ICD-10-CM

## 2020-05-05 DIAGNOSIS — I639 Cerebral infarction, unspecified: Secondary | ICD-10-CM | POA: Diagnosis present

## 2020-05-05 DIAGNOSIS — R270 Ataxia, unspecified: Secondary | ICD-10-CM | POA: Diagnosis present

## 2020-05-05 DIAGNOSIS — Z20822 Contact with and (suspected) exposure to covid-19: Secondary | ICD-10-CM | POA: Diagnosis present

## 2020-05-05 DIAGNOSIS — I63541 Cerebral infarction due to unspecified occlusion or stenosis of right cerebellar artery: Principal | ICD-10-CM | POA: Diagnosis present

## 2020-05-05 DIAGNOSIS — I10 Essential (primary) hypertension: Secondary | ICD-10-CM | POA: Diagnosis present

## 2020-05-05 DIAGNOSIS — R760 Raised antibody titer: Secondary | ICD-10-CM | POA: Diagnosis present

## 2020-05-05 DIAGNOSIS — Z8249 Family history of ischemic heart disease and other diseases of the circulatory system: Secondary | ICD-10-CM | POA: Diagnosis not present

## 2020-05-05 DIAGNOSIS — Z8673 Personal history of transient ischemic attack (TIA), and cerebral infarction without residual deficits: Secondary | ICD-10-CM | POA: Diagnosis not present

## 2020-05-05 DIAGNOSIS — F17210 Nicotine dependence, cigarettes, uncomplicated: Secondary | ICD-10-CM | POA: Diagnosis present

## 2020-05-05 DIAGNOSIS — Z811 Family history of alcohol abuse and dependence: Secondary | ICD-10-CM

## 2020-05-05 DIAGNOSIS — N179 Acute kidney failure, unspecified: Secondary | ICD-10-CM | POA: Diagnosis not present

## 2020-05-05 DIAGNOSIS — E785 Hyperlipidemia, unspecified: Secondary | ICD-10-CM | POA: Diagnosis present

## 2020-05-05 DIAGNOSIS — Z833 Family history of diabetes mellitus: Secondary | ICD-10-CM | POA: Diagnosis not present

## 2020-05-05 DIAGNOSIS — E875 Hyperkalemia: Secondary | ICD-10-CM | POA: Diagnosis present

## 2020-05-05 DIAGNOSIS — Z791 Long term (current) use of non-steroidal anti-inflammatories (NSAID): Secondary | ICD-10-CM

## 2020-05-05 DIAGNOSIS — R519 Headache, unspecified: Secondary | ICD-10-CM

## 2020-05-05 DIAGNOSIS — R42 Dizziness and giddiness: Secondary | ICD-10-CM | POA: Diagnosis not present

## 2020-05-05 DIAGNOSIS — I7774 Dissection of vertebral artery: Secondary | ICD-10-CM | POA: Diagnosis not present

## 2020-05-05 DIAGNOSIS — Z716 Tobacco abuse counseling: Secondary | ICD-10-CM | POA: Diagnosis not present

## 2020-05-05 DIAGNOSIS — E669 Obesity, unspecified: Secondary | ICD-10-CM

## 2020-05-05 DIAGNOSIS — R29700 NIHSS score 0: Secondary | ICD-10-CM | POA: Diagnosis not present

## 2020-05-05 HISTORY — DX: Cerebral infarction, unspecified: I63.9

## 2020-05-05 HISTORY — DX: Acute kidney failure, unspecified: N17.9

## 2020-05-05 LAB — CBC
HCT: 38.8 % (ref 36.0–46.0)
Hemoglobin: 13.3 g/dL (ref 12.0–15.0)
MCH: 33 pg (ref 26.0–34.0)
MCHC: 34.3 g/dL (ref 30.0–36.0)
MCV: 96.3 fL (ref 80.0–100.0)
Platelets: 353 10*3/uL (ref 150–400)
RBC: 4.03 MIL/uL (ref 3.87–5.11)
RDW: 12.1 % (ref 11.5–15.5)
WBC: 8.8 10*3/uL (ref 4.0–10.5)
nRBC: 0 % (ref 0.0–0.2)

## 2020-05-05 LAB — COMPREHENSIVE METABOLIC PANEL
ALT: 38 U/L (ref 0–44)
AST: 42 U/L — ABNORMAL HIGH (ref 15–41)
Albumin: 4 g/dL (ref 3.5–5.0)
Alkaline Phosphatase: 67 U/L (ref 38–126)
Anion gap: 15 (ref 5–15)
BUN: 14 mg/dL (ref 6–20)
CO2: 24 mmol/L (ref 22–32)
Calcium: 9.6 mg/dL (ref 8.9–10.3)
Chloride: 98 mmol/L (ref 98–111)
Creatinine, Ser: 1.17 mg/dL — ABNORMAL HIGH (ref 0.44–1.00)
GFR, Estimated: >60 mL/min (ref >60)
Glucose, Bld: 89 mg/dL (ref 70–99)
Potassium: 3.1 mmol/L — ABNORMAL LOW (ref 3.5–5.1)
Sodium: 137 mmol/L (ref 135–145)
Total Bilirubin: 0.4 mg/dL (ref 0.3–1.2)
Total Protein: 8.2 g/dL — ABNORMAL HIGH (ref 6.5–8.1)

## 2020-05-05 LAB — PROTIME-INR
INR: 1 (ref 0.8–1.2)
Prothrombin Time: 12.7 seconds (ref 11.4–15.2)

## 2020-05-05 LAB — DIFFERENTIAL
Abs Immature Granulocytes: 0.04 10*3/uL (ref 0.00–0.07)
Basophils Absolute: 0 10*3/uL (ref 0.0–0.1)
Basophils Relative: 0 %
Eosinophils Absolute: 0.2 10*3/uL (ref 0.0–0.5)
Eosinophils Relative: 2 %
Immature Granulocytes: 1 %
Lymphocytes Relative: 31 %
Lymphs Abs: 2.7 10*3/uL (ref 0.7–4.0)
Monocytes Absolute: 0.9 10*3/uL (ref 0.1–1.0)
Monocytes Relative: 11 %
Neutro Abs: 4.9 10*3/uL (ref 1.7–7.7)
Neutrophils Relative %: 55 %

## 2020-05-05 LAB — APTT: aPTT: 26 seconds (ref 24–36)

## 2020-05-05 MED ORDER — LOSARTAN POTASSIUM 50 MG PO TABS
100.0000 mg | ORAL_TABLET | Freq: Every day | ORAL | Status: DC
  Filled 2020-05-05 (×2): qty 2

## 2020-05-05 MED ORDER — IOHEXOL 350 MG/ML SOLN
75.0000 mL | Freq: Once | INTRAVENOUS | Status: AC | PRN
  Administered 2020-05-05: 75 mL via INTRAVENOUS

## 2020-05-05 MED ORDER — STROKE: EARLY STAGES OF RECOVERY BOOK
Freq: Once | Status: AC
  Filled 2020-05-05: qty 1

## 2020-05-05 MED ORDER — ASPIRIN EC 325 MG PO TBEC
650.0000 mg | DELAYED_RELEASE_TABLET | Freq: Once | ORAL | Status: AC
  Administered 2020-05-05: 650 mg via ORAL
  Filled 2020-05-05: qty 2

## 2020-05-05 MED ORDER — SODIUM CHLORIDE 0.9% FLUSH
3.0000 mL | Freq: Once | INTRAVENOUS | Status: AC
Start: 2020-05-05 — End: 2020-05-05
  Administered 2020-05-05: 3 mL via INTRAVENOUS

## 2020-05-05 MED ORDER — POTASSIUM CHLORIDE CRYS ER 20 MEQ PO TBCR
40.0000 meq | EXTENDED_RELEASE_TABLET | Freq: Once | ORAL | Status: AC
  Administered 2020-05-06: 40 meq via ORAL
  Filled 2020-05-05: qty 2

## 2020-05-05 MED ORDER — HYDROCHLOROTHIAZIDE 25 MG PO TABS
25.0000 mg | ORAL_TABLET | Freq: Every day | ORAL | Status: DC
  Filled 2020-05-05 (×2): qty 1

## 2020-05-05 MED ORDER — SODIUM CHLORIDE 0.9 % IV BOLUS
500.0000 mL | Freq: Once | INTRAVENOUS | Status: AC
  Administered 2020-05-06: 500 mL via INTRAVENOUS

## 2020-05-05 MED ORDER — ACETAMINOPHEN 160 MG/5ML PO SOLN: 650.0000 mg | ORAL | Status: DC | PRN

## 2020-05-05 MED ORDER — ASPIRIN EC 325 MG PO TBEC
325.0000 mg | DELAYED_RELEASE_TABLET | Freq: Every day | ORAL | Status: DC
  Administered 2020-05-06 – 2020-05-07 (×2): 325 mg via ORAL
  Filled 2020-05-05 (×2): qty 1

## 2020-05-05 MED ORDER — ACETAMINOPHEN 325 MG PO TABS: 650.0000 mg | ORAL_TABLET | ORAL | Status: DC | PRN

## 2020-05-05 MED ORDER — LOSARTAN POTASSIUM-HCTZ 100-25 MG PO TABS: 1.0000 | ORAL_TABLET | Freq: Every day | ORAL | Status: DC

## 2020-05-05 MED ORDER — METOPROLOL SUCCINATE ER 100 MG PO TB24
100.0000 mg | ORAL_TABLET | Freq: Every day | ORAL | Status: DC
  Filled 2020-05-05: qty 1

## 2020-05-05 MED ORDER — ACETAMINOPHEN 650 MG RE SUPP: 650.0000 mg | RECTAL | Status: DC | PRN

## 2020-05-05 NOTE — ED Triage Notes (Signed)
Pt states she just had an outpatient MRI and was told to come to the ED for a stroke.  Reports walking into walls on Monday and posterior neck pain.  Denies numbness/weakness.

## 2020-05-05 NOTE — Consult Note (Signed)
Referring Physician: Dr. Sabra Heck    Chief Complaint: Right sided neck discomfort with right sided ataxia.   HPI: Marissa Crawford is an 40 y.o. female with a PMHx of HTN and smoking who presents to the ED for evaluation of new onset right sided incoordination with right sided ataxia beginning on Monday. 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 When she got up, she was dizzy and her gait was unsteady. Her right leg felt heavy as well. She did not have any confusion, vision changes, slurred speech, RUE weakness or facial droop. She states that the only unusual occurrence prior to symptom onset was that her 77 year old daughter had been sleeping on her right arm. The patient called her PCP on Monday and she was advised to go to the ER for further evaluation, but the patient decided instead to go to work. Her supervisor noted that she seemed ill, but patient felt that her symptoms would go away. On Tuesday she was evaluated by her PCP via telemedicine and an MRI brain was recommended. She states that she was "moving slowly\" at work for the first two days, but by Wednesday her symptoms had resolved. Her outpatient MRI was completed today, revealing a large right cerebellar ischemic infarction. She presented to the ED for further evaluation.   LSN: Monday afternoon tPA Given: No: Out of the time window  Past Medical History:  Diagnosis Date  . HTN (hypertension)     History reviewed. No pertinent surgical history.  Family History  Problem Relation Age of Onset  . Cervical cancer Mother   . Cancer Mother 57       cervical  . Hypertension Father   . Alcohol abuse Father   . Gout Father   . Seizures Sister   . Diabetes Brother   . Stroke Maternal Grandfather   . Diabetes Maternal Grandfather   . Stroke Paternal Grandmother   . Diabetes Paternal  Grandmother   . Diabetes Maternal Grandmother   . Diabetes Paternal Grandfather    Social History:  reports that she has been smoking cigarettes. She started smoking about 21 years ago. She has a 20.00 pack-year smoking history. She has never used smokeless tobacco. She reports current alcohol use of about 2.0 standard drinks of alcohol per week. She reports that she does not use drugs.  Allergies: No Known Allergies  Home Medications:  No current facility-administered medications on file prior to encounter.   Current Outpatient Medications on File Prior to Encounter  Medication Sig Dispense Refill  . losartan-hydrochlorothiazide (HYZAAR) 100-25 MG tablet Take 1 tablet by mouth daily. 90 tablet 3  . metoprolol succinate (TOPROL-XL) 100 MG 24 hr tablet Take 1 tablet (100 mg total) by mouth daily. Take with or immediately following a meal. 90 tablet 3  . naproxen (NAPROSYN) 500 MG tablet Take 1 tablet (500 mg total) by mouth 2 (two) times daily. 30 tablet 0  . valACYclovir (VALTREX) 500 MG tablet valacyclovir 500 mg tablet  TK 1 T PO QD       ROS: As per HPI. Does not endorse any additional symptoms.   Physical Examination: Blood pressure (!) 147/73, pulse 81, temperature 98.1 F (36.7 C), resp. rate 18, last menstrual period 04/30/2020, SpO2 98 %, currently breastfeeding.  HEENT: [employer]/AT Lungs: Respirations unlabored Ext: No edema  Neurologic Examination: Mental  Status: Alert, oriented, thought content appropriate.  Speech fluent without evidence of aphasia.  Able to follow all commands without difficulty. Cranial Nerves: II:  Visual fields intact. No extinction to DSS. PERRL.  III,IV, VI: No ptosis. EOMI. No nystagmus.  V,VII: Smile symmetric, facial temp sensation equal bilaterally VIII: hearing intact to voice IX,X: No hoarseness or hypophonia XI: Symmetric XII: Midline tongue extension  Motor: Right : Upper extremity   5/5    Left:     Upper extremity   5/5  Lower  extremity   5/5     Lower extremity   5/5 No pronator drift.  Sensory: Temp and light touch intact throughout, bilaterally. No extinction to DSS.  Deep Tendon Reflexes:  2+ bilateral brachioradialis and biceps 2+ bilateral patellae and achilles.  Cerebellar: No ataxia with FNF bilaterally. RAM normal bilaterally. H-S normal bilaterally. Foot-tapping normal on the left but with subtle impairment on the right.  Gait: Walks somewhat tentatively, but gait is narrow-based with good turns and no unsteadiness.    Results for orders placed or performed during the hospital encounter of 05/05/20 (from the past 48 hour(s))  Protime-INR     Status: None   Collection Time: 05/05/20  6:31 PM  Result Value Ref Range   Prothrombin Time 12.7 11.4 - 15.2 seconds   INR 1.0 0.8 - 1.2    Comment: (NOTE) INR goal varies based on device and disease states. Performed at [facility], [place] [address], [place], [place] [address]   APTT     Status: None   Collection Time: 05/05/20  6:31 PM  Result Value Ref Range   aPTT 26 24 - 36 seconds    Comment: Performed at [facility] [address], [place], [place] [address]  CBC     Status: None   Collection Time: 05/05/20  6:31 PM  Result Value Ref Range   WBC 8.8 4.0 - 10.5 K/uL   RBC 4.03 3.87 - 5.11 MIL/uL   Hemoglobin 13.3 12.0 - 15.0 g/dL   HCT 38.8 36.0 - 46.0 %   MCV 96.3 80.0 - 100.0 fL   MCH 33.0 26.0 - 34.0 pg   MCHC 34.3 30.0 - 36.0 g/dL   RDW 12.1 11.5 - 15.5 %   Platelets 353 150 - 400 K/uL   nRBC 0.0 0.0 - 0.2 %    Comment: Performed at [facility], [place] [address], [place], [place] [address]  Differential     Status: None   Collection Time: 05/05/20  6:31 PM  Result Value Ref Range   Neutrophils Relative % 55 %   Neutro Abs 4.9 1.7 - 7.7 K/uL   Lymphocytes Relative 31 %   Lymphs Abs 2.7 0.7 - 4.0 K/uL   Monocytes Relative 11 %   Monocytes Absolute 0.9 0.1 - 1.0 K/uL   Eosinophils Relative 2 %   Eosinophils Absolute  0.2 0.0 - 0.5 K/uL   Basophils Relative 0 %   Basophils Absolute 0.0 0.0 - 0.1 K/uL   Immature Granulocytes 1 %   Abs Immature Granulocytes 0.04 0.00 - 0.07 K/uL    Comment: Performed at [facility] [address], [place], [place] [address]  Comprehensive metabolic panel     Status: Abnormal   Collection Time: 05/05/20  6:31 PM  Result Value Ref Range   Sodium 137 135 - 145 mmol/L   Potassium 3.1 (L) 3.5 - 5.1 mmol/L   Chloride 98 98 -  111 mmol/L   CO2 24 22 - 32 mmol/L   Glucose, Bld 89 70 - 99 mg/dL    Comment: Glucose reference range applies only to samples taken after fasting for at least 8 hours.   BUN 14 6 - 20 mg/dL   Creatinine, Ser 1.17 (H) 0.44 - 1.00 mg/dL   Calcium 9.6 8.9 - 10.3 mg/dL   Total Protein 8.2 (H) 6.5 - 8.1 g/dL   Albumin 4.0 3.5 - 5.0 g/dL   AST 42 (H) 15 - 41 U/L   ALT 38 0 - 44 U/L   Alkaline Phosphatase 67 38 - 126 U/L   Total Bilirubin 0.4 0.3 - 1.2 mg/dL   GFR, Estimated >60 >60 mL/min    Comment: (NOTE) Calculated using the CKD-EPI Creatinine Equation (2021)    Anion gap 15 5 - 15    Comment: Performed at [facility], [place] [address], [place], [place] [address]   MR Brain Wo Contrast  Addendum Date: 05/05/2020   ADDENDUM REPORT: 05/05/2020 15:47 ADDENDUM: These results were called by telephone at the time of interpretation on 05/05/2020 at 3:47 pm to provider Einar Pheasant , who verbally acknowledged these results. Electronically Signed   By: Franchot Gallo M.D.   On: 05/05/2020 15:47   Result Date: 05/05/2020 CLINICAL DATA:  Headache, dizziness, ataxia. EXAM: MRI HEAD WITHOUT CONTRAST TECHNIQUE: Multiplanar, multiecho pulse sequences of the brain and surrounding structures were obtained without intravenous contrast. COMPARISON:  None. FINDINGS: Brain: Acute infarct in the right PICA territory. Moderately large cerebellar infarct is present without hemorrhage. No other acute infarct. No evidence of chronic ischemia Ventricle size normal.   Negative for hemorrhage or mass. Vascular: Normal arterial flow voids. Skull and upper cervical spine: Negative Sinuses/Orbits: Paranasal sinuses clear.  Negative orbit Other: None IMPRESSION: Acute infarct right PICA territory. Negative for hemorrhage. Otherwise negative. Recommend further vascular evaluation to rule out vertebral dissection or embolic source. Electronically Signed: By: Franchot Gallo M.D. On: 05/05/2020 15:39    Assessment: 40 y.o. female with subacute right cerebellar stroke, large in size. Time of symptom onset was last Monday.  1. Exam is negative except for subtle deficit of foot-tapping on the right.  2. MRI brain: Acute infarct right PICA territory. The size of the infarction is large. She will be out of the classic 7-day period of greatest risk for malignant swelling and herniation on Monday. She will need to be observed until then.  3. CTA completed with report pending. There appears to be loss of flow in the right vertebral artery. Suspect dissection.  4. Stroke Risk Factors - HTN and smoking. Both are also risk factors for dissection.  5. Has had a recent TTE which was negative.   Recommendations: 1. HgbA1c, fasting lipid panel 2. TEE 3. PT consult, OT consult, Speech consult 4. Cardiac telemetry 5. Frequent neuro checks 6. BP management.  7. Smoking cessation.  8. ASA 650 mg po x 1 now.  9. Continue ASA 325 mg po qd for at least the next 6 months.  10. Cardiac telemetry 11. BP management. Out of the permissive HTN time window.  12. Repeat CT head on Monday 13. Will need repeat CTA of head and neck in 6 months to assess for possible resolution.   @Electronically  signed: Dr. Kerney Elbe 05/05/2020, 9:30 PM

## 2020-05-05 NOTE — H&P (Addendum)
History and Physical   ROBYNE MATAR TOI:[id number] DOB: 1980-06-19 DOA: 05/05/2020  PCP: Ann Held, DO   Patient coming from: Home  Chief Complaint: Uncoordination on right side  HPI: Marissa Crawford is a 40 y.o. female with medical history significant of hypertension, obesity, tobacco use who presents with new right-sided decreased coordination over the past week and outpatient MRI demonstrating CVA.  Patient awoke on Monday with a headache, right neck and shoulder pain.  She also noted that she was unsteady on her feet and had some associated dizziness on standing.  She also noted some heaviness of her right leg at that time.  She denied any any other symptoms.  She states that the only thing unusual at the time was that her 110-year-old daughter had been sleeping on her right arm.  She did call her PCP on Monday who advised her to present to the ED for further evaluation however patient felt like she can work through it and went to work.  At work she was working slower and this lasted for a couple of days and got better on Wednesday.  She did have a televisit with her PCP on Tuesday and at that time PCP ordered an MRI.  The MRI was performed today and demonstrated an acute right PICA territory infarct without hemorrhage.  Patient was advised to head to the ED for further evaluation which she did. Patient denies fever, chills, cough, shortness of breath, chest pain, abd pain, constipation, diarrhea, nausea, vomiting.  Of note, patient works here at [employer] [place]  ED Course: Vital signs in the ED significant for blood pressure in the 833A to 250N systolic.  Single reading of heart rate in the 103.  Ostensibly stable.  Lab work-up showed BMP with potassium 3.9, creatinine 1.17 which appears to be elevated from her baseline of 0.7 though improved from recent labs done yesterday which showed a creatinine of 1.57.  LFTs showed protein 8.2 and AST 42.  CBC within normal limits.  PT, INR within normal  limits.  Respiratory panel for Covid pending.  MR results as above showed acute right PICA territory infarct.  CTA performed in ED showed occlusion of the right vertebral artery suspected to be acute and possibly due to dissection.  Neurology was consulted in the ED.  Review of Systems: As per HPI otherwise all other systems reviewed and are negative.  Past Medical History:  Diagnosis Date  . Acute CVA (cerebrovascular accident) ([place]) 05/05/2020  . AKI (acute kidney injury) ([place]) 05/05/2020  . HTN (hypertension)    History reviewed. No pertinent surgical history.  Social History  reports that she has been smoking cigarettes. She started smoking about 21 years ago. She has a 20.00 pack-year smoking history. She has never used smokeless tobacco. She reports current alcohol use of about 2.0 standard drinks of alcohol per week. She reports that she does not use drugs.  No Known Allergies  Family History  Problem Relation Age of Onset  . Cervical cancer Mother   . Cancer Mother 42       cervical  . Hypertension Father   . Alcohol abuse Father   . Gout Father   . Seizures Sister   . Diabetes Brother   . Stroke Maternal Grandfather   . Diabetes Maternal Grandfather   . Stroke Paternal Grandmother   . Diabetes Paternal Grandmother   . Diabetes Maternal Grandmother   . Diabetes Paternal Grandfather   Reviewed on admission  Prior  to Admission medications   Medication Sig Start Date End Date Taking? Authorizing Provider  losartan-hydrochlorothiazide (HYZAAR) 100-25 MG tablet Take 1 tablet by mouth daily. 03/26/20   Ann Held, DO  metoprolol succinate (TOPROL-XL) 100 MG 24 hr tablet Take 1 tablet (100 mg total) by mouth daily. Take with or immediately following a meal. 03/26/20   Carollee Herter, Alferd Apa, DO  naproxen (NAPROSYN) 500 MG tablet Take 1 tablet (500 mg total) by mouth 2 (two) times daily. 04/05/20   Wieters, Hallie C, PA-C  valACYclovir (VALTREX) 500 MG tablet  valacyclovir 500 mg tablet  TK 1 T PO QD    [provider]    Physical Exam: Vitals:   05/05/20 1818 05/05/20 2053 05/05/20 2200  BP: (!) 169/108 (!) 147/73 (!) 149/85  Pulse: (!) 103 81 97  Resp: 18 18 18   Temp: 98.1 F (36.7 C) 98.1 F (36.7 C)   TempSrc: Oral    SpO2: 100% 98% 100%   Physical Exam Constitutional:      General: She is not in acute distress.    Appearance: Normal appearance.  HENT:     Head: Normocephalic and atraumatic.     Mouth/Throat:     Mouth: Mucous membranes are moist.     Pharynx: Oropharynx is clear.  Eyes:     Extraocular Movements: Extraocular movements intact.     Pupils: Pupils are equal, round, and reactive to light.  Cardiovascular:     Rate and Rhythm: Normal rate and regular rhythm.     Pulses: Normal pulses.     Heart sounds: Normal heart sounds.  Pulmonary:     Effort: Pulmonary effort is normal. No respiratory distress.     Breath sounds: Normal breath sounds.  Abdominal:     General: Bowel sounds are normal. There is no distension.     Palpations: Abdomen is soft.     Tenderness: There is no abdominal tenderness.  Musculoskeletal:        General: No swelling or deformity.  Skin:    General: Skin is warm and dry.  Neurological:     Mental Status: Mental status is at baseline.     Comments: Mental Status: Patient is awake, alert, oriented x3 No signs of aphasia or neglect Cranial Nerves: II: Pupils equal, round, and reactive to light.  III,IV, VI: EOMI without ptosis or diploplia.  V: Facial sensation is symmetric tolight touch. VII: Facial movement is symmetric.  VIII: hearing is intact to voice X: Uvula elevates symmetrically XI: Shoulder shrug is symmetric. XII: tongue is midline without atrophy or fasciculations.  Motor: Good effort thorughout, at Least 5/5 bilateral UE, 5/5 bilateral lower extremitiy Sensory: Sensation is grossly intact bilateral UEs & LEs Cerebellar: Finger-Nose intact bilalat, but  slower on the right    Labs on Admission: I have personally reviewed following labs and imaging studies  CBC: Recent Labs  Lab 05/04/20 0905 05/05/20 1831  WBC 8.1 8.8  NEUTROABS 4.9 4.9  HGB 12.6 13.3  HCT 37.1 38.8  MCV 97.3 96.3  PLT 332.0 353    Basic Metabolic Panel: Recent Labs  Lab 05/04/20 0905 05/05/20 1831  NA 136 137  K 3.4* 3.1*  CL 99 98  CO2 28 24  GLUCOSE 103* 89  BUN 20 14  CREATININE 1.57* 1.17*  CALCIUM 9.6 9.6    GFR: CrCl cannot be calculated (Unknown ideal weight.).  Liver Function Tests: Recent Labs  Lab 05/04/20 0905 05/05/20 1831  AST 38*  42*  ALT 37* 38  ALKPHOS 70 67  BILITOT 0.5 0.4  PROT 7.8 8.2*  ALBUMIN 4.3 4.0    Urine analysis:    Component Value Date/Time   LABSPEC 1.020 07/11/2011 1312   PHURINE 6.5 07/11/2011 1312   GLUCOSEU NEGATIVE 07/11/2011 1312   HGBUR SMALL (A) 07/11/2011 1312   BILIRUBINUR negative 02/07/2019 1139   BILIRUBINUR neg 12/23/2016 1206   KETONESUR trace (5) (A) 02/07/2019 1139   KETONESUR NEGATIVE 07/11/2011 1312   PROTEINUR =30 (A) 02/07/2019 1139   PROTEINUR neg 12/23/2016 1206   PROTEINUR NEGATIVE 07/11/2011 1312   UROBILINOGEN 0.2 02/07/2019 1139   UROBILINOGEN 1.0 07/11/2011 1312   NITRITE Negative 02/07/2019 1139   NITRITE neg 12/23/2016 1206   NITRITE POSITIVE (A) 07/11/2011 1312   LEUKOCYTESUR Negative 02/07/2019 1139    Radiological Exams on Admission: CT Angio Head W or Wo Contrast  Result Date: 05/05/2020 CLINICAL DATA:  Initial evaluation for acute stroke. EXAM: CT ANGIOGRAPHY HEAD AND NECK TECHNIQUE: Multidetector CT imaging of the head and neck was performed using the standard protocol during bolus administration of intravenous contrast. Multiplanar CT image reconstructions and MIPs were obtained to evaluate the vascular anatomy. Carotid stenosis measurements (when applicable) are obtained utilizing NASCET criteria, using the distal internal carotid diameter as the  denominator. CONTRAST:  69mL OMNIPAQUE IOHEXOL 350 MG/ML SOLN COMPARISON:  Prior MRI from earlier the same day. FINDINGS: CT HEAD FINDINGS Brain: Evolving right PICA territory infarct again seen, stable from previous MRI. No associated hemorrhage or significant regional mass effect. Remainder the brain is normal in appearance. No other acute large vessel territory infarct or hemorrhage. No mass lesion, midline shift or significant mass effect. No extra-axial fluid collection. No hydrocephalus. Vascular: No hyperdense vessel. Skull: Scalp soft tissues and calvarium within normal limits. Sinuses: Paranasal sinuses are largely clear.  No mastoid effusion. Orbits: Globes orbital soft tissues within normal limits. Review of the MIP images confirms the above findings CTA NECK FINDINGS Aortic arch: Visualized aortic arch of normal caliber with normal branch pattern. No hemodynamically significant stenosis seen about the origin of the great vessels. Visualized subclavian arteries widely patent. Right carotid system: Right common and internal carotid arteries patent without stenosis, dissection or occlusion. Mild eccentric calcified plaque at the right bifurcation without significant stenosis. Left carotid system: Left common and internal carotid arteries widely patent without stenosis, dissection or occlusion. Vertebral arteries: Both vertebral arteries arise from the subclavian arteries. Left vertebral artery widely patent within the neck without stenosis, dissection or occlusion. Right vertebral artery occluded at its origin, and remains occluded within the neck. Skeleton: No visible acute osseous abnormality. No discrete or worrisome osseous lesions. Other neck: No other acute soft tissue abnormality within the neck. No mass or adenopathy. Upper chest: Visualized upper chest demonstrates no acute finding. Review of the MIP images confirms the above findings CTA HEAD FINDINGS Anterior circulation: Petrous segments widely  patent. Minimal atheromatous plaque within the carotid siphons without significant stenosis. A1 segments patent bilaterally. Normal anterior communicating artery complex. Anterior cerebral arteries patent to their distal aspects without stenosis. Normal in stenosis or occlusion. Normal MCA bifurcations. Distal MCA branches well perfused and symmetric. Posterior circulation: Left vertebral artery widely patent to the vertebrobasilar junction. Right vertebral artery occluded as it courses into the cranial vault. Retrograde filling of the distal right V4 segment via collateral flow across the vertebrobasilar junction. Perfusion of the origin/proximal aspect of the right PICA. Left PICA patent as well. Basilar patent to  its distal aspect without stenosis. Superior cerebellar arteries patent bilaterally. Both PCA supplied via the basilar as well as small bilateral posterior communicating arteries. Both PCAs well perfused to their distal aspects. Venous sinuses: Patent allowing for timing the contrast bolus. Anatomic variants: None significant. Review of the MIP images confirms the above findings IMPRESSION: CT HEAD IMPRESSION: 1. Evolving acute right PICA territory infarct, stable from previous MRI. No associated hemorrhage or significant regional mass effect. 2. Otherwise negative head CT. No other acute intracranial abnormality. CTA HEAD AND NECK IMPRESSION: 1. Occlusion of the right vertebral artery at its origin, suspected to be acute in nature given the acute right PICA territory infarct, possibly related to dissection. Distal reconstitution of the right V4 segment via retrograde filling across the vertebrobasilar junction. The proximal right PICA is perfused. 2. Otherwise negative CTA of the head and neck. No other large vessel occlusion, hemodynamically significant stenosis, or other acute vascular abnormality. Electronically Signed   By: Jeannine Boga M.D.   On: 05/05/2020 21:42   CT Angio Neck W and/or  Wo Contrast  Result Date: 05/05/2020 CLINICAL DATA:  Initial evaluation for acute stroke. EXAM: CT ANGIOGRAPHY HEAD AND NECK TECHNIQUE: Multidetector CT imaging of the head and neck was performed using the standard protocol during bolus administration of intravenous contrast. Multiplanar CT image reconstructions and MIPs were obtained to evaluate the vascular anatomy. Carotid stenosis measurements (when applicable) are obtained utilizing NASCET criteria, using the distal internal carotid diameter as the denominator. CONTRAST:  55mL OMNIPAQUE IOHEXOL 350 MG/ML SOLN COMPARISON:  Prior MRI from earlier the same day. FINDINGS: CT HEAD FINDINGS Brain: Evolving right PICA territory infarct again seen, stable from previous MRI. No associated hemorrhage or significant regional mass effect. Remainder the brain is normal in appearance. No other acute large vessel territory infarct or hemorrhage. No mass lesion, midline shift or significant mass effect. No extra-axial fluid collection. No hydrocephalus. Vascular: No hyperdense vessel. Skull: Scalp soft tissues and calvarium within normal limits. Sinuses: Paranasal sinuses are largely clear.  No mastoid effusion. Orbits: Globes orbital soft tissues within normal limits. Review of the MIP images confirms the above findings CTA NECK FINDINGS Aortic arch: Visualized aortic arch of normal caliber with normal branch pattern. No hemodynamically significant stenosis seen about the origin of the great vessels. Visualized subclavian arteries widely patent. Right carotid system: Right common and internal carotid arteries patent without stenosis, dissection or occlusion. Mild eccentric calcified plaque at the right bifurcation without significant stenosis. Left carotid system: Left common and internal carotid arteries widely patent without stenosis, dissection or occlusion. Vertebral arteries: Both vertebral arteries arise from the subclavian arteries. Left vertebral artery widely  patent within the neck without stenosis, dissection or occlusion. Right vertebral artery occluded at its origin, and remains occluded within the neck. Skeleton: No visible acute osseous abnormality. No discrete or worrisome osseous lesions. Other neck: No other acute soft tissue abnormality within the neck. No mass or adenopathy. Upper chest: Visualized upper chest demonstrates no acute finding. Review of the MIP images confirms the above findings CTA HEAD FINDINGS Anterior circulation: Petrous segments widely patent. Minimal atheromatous plaque within the carotid siphons without significant stenosis. A1 segments patent bilaterally. Normal anterior communicating artery complex. Anterior cerebral arteries patent to their distal aspects without stenosis. Normal in stenosis or occlusion. Normal MCA bifurcations. Distal MCA branches well perfused and symmetric. Posterior circulation: Left vertebral artery widely patent to the vertebrobasilar junction. Right vertebral artery occluded as it courses into the cranial vault. Retrograde  filling of the distal right V4 segment via collateral flow across the vertebrobasilar junction. Perfusion of the origin/proximal aspect of the right PICA. Left PICA patent as well. Basilar patent to its distal aspect without stenosis. Superior cerebellar arteries patent bilaterally. Both PCA supplied via the basilar as well as small bilateral posterior communicating arteries. Both PCAs well perfused to their distal aspects. Venous sinuses: Patent allowing for timing the contrast bolus. Anatomic variants: None significant. Review of the MIP images confirms the above findings IMPRESSION: CT HEAD IMPRESSION: 1. Evolving acute right PICA territory infarct, stable from previous MRI. No associated hemorrhage or significant regional mass effect. 2. Otherwise negative head CT. No other acute intracranial abnormality. CTA HEAD AND NECK IMPRESSION: 1. Occlusion of the right vertebral artery at its  origin, suspected to be acute in nature given the acute right PICA territory infarct, possibly related to dissection. Distal reconstitution of the right V4 segment via retrograde filling across the vertebrobasilar junction. The proximal right PICA is perfused. 2. Otherwise negative CTA of the head and neck. No other large vessel occlusion, hemodynamically significant stenosis, or other acute vascular abnormality. Electronically Signed   By: Jeannine Boga M.D.   On: 05/05/2020 21:42   MR Brain Wo Contrast  Addendum Date: 05/05/2020   ADDENDUM REPORT: 05/05/2020 15:47 ADDENDUM: These results were called by telephone at the time of interpretation on 05/05/2020 at 3:47 pm to provider Einar Pheasant , who verbally acknowledged these results. Electronically Signed   By: Franchot Gallo M.D.   On: 05/05/2020 15:47   Result Date: 05/05/2020 CLINICAL DATA:  Headache, dizziness, ataxia. EXAM: MRI HEAD WITHOUT CONTRAST TECHNIQUE: Multiplanar, multiecho pulse sequences of the brain and surrounding structures were obtained without intravenous contrast. COMPARISON:  None. FINDINGS: Brain: Acute infarct in the right PICA territory. Moderately large cerebellar infarct is present without hemorrhage. No other acute infarct. No evidence of chronic ischemia Ventricle size normal.  Negative for hemorrhage or mass. Vascular: Normal arterial flow voids. Skull and upper cervical spine: Negative Sinuses/Orbits: Paranasal sinuses clear.  Negative orbit Other: None IMPRESSION: Acute infarct right PICA territory. Negative for hemorrhage. Otherwise negative. Recommend further vascular evaluation to rule out vertebral dissection or embolic source. Electronically Signed: By: Franchot Gallo M.D. On: 05/05/2020 15:39   EKG: Independently reviewed.  Normal sinus rhythm at 99 bpm, some nonspecific T wave flattening in precordial leads.  Assessment/Plan Principal Problem:   Acute CVA (cerebrovascular accident) [facility]) Active Problems:   HTN  (hypertension)   Hypokalemia   AKI (acute kidney injury) ([employer])  Acute CVA > Patient with right-sided discoordination and heaviness of her right lower extremity as well as dizziness which began on Monday and seems to resolved on Wednesday. > Only residual deficits noticed were slowing of finger-to-nose exam on right. > Outpatient MRI demonstrated large right PICA territory infarct without hemorrhage.  CTA showed occlusion of right vertebral artery likely dissection. > Per neurology patient remains within 7-day window for risk of swelling to the size of the infarct, window will end on Monday. - Appreciate neurology's assistance with this patient - Out of window for permissive hypertension we will continue home meds -Patient had recent normal echo outpatient, neurology giving consideration to TEE - ASA 650 mg given once and 325 mg started daily per neurology - A1C  - Lipid panel  - Tele monitoring  - SLP eval - PT/OT - Tobacco cessation  AKI Hyperkalemia > Creatinine elevated to 1.17 from baseline of 0.7 on admission.  Though did have labs  yesterday of 1.57 so this does appear to be improving. She states she was drinking fluids yesterday and held her blood pressure medicines. > Potassium 3.1 on admission - Encourage p.o. intake and small IV bolus - 40 mEq p.o. K-Dur after passing swallow screen - Add on magnesium - Trend renal function and electrolytes   Hypertension - Continue home hydrochlorothiazide, losartan, metoprolol tomorrow  DVT prophylaxis: SCDs  Code Status:   Full  Family Communication:  None on admission  Disposition Plan:   Patient is from:  Home  Anticipated DC to:  Home  Anticipated DC date:  1 to 2 days  Anticipated DC barriers: None  Consults called:  Neurology, Dr. Cheral Marker consulted by EDP.   Admission status:  Observation, telemetry   Severity of Illness: The appropriate patient status for this patient is OBSERVATION. Observation status is judged to be  reasonable and necessary in order to provide the required intensity of service to ensure the patient's safety. The patient's presenting symptoms, physical exam findings, and initial radiographic and laboratory data in the context of their medical condition is felt to place them at decreased risk for further clinical deterioration. Furthermore, it is anticipated that the patient will be medically stable for discharge from the hospital within 2 midnights of admission. The following factors support the patient status of observation.   " The patient's presenting symptoms include several days of right-sided discoordination earlier in the week.. " The physical exam findings include slow right finger-nose exam. " The initial radiographic and laboratory data are concerning for potassium of 3.1, creatinine of 1.17.  MRI with large right PICA territory infarct, CTA with occlusion of vertebral artery with dissection suspected.Marcelyn Bruins MD [facility]  How to contact the [facility] Attending or Consulting provider [place] or covering provider during after hours [place], for this patient?   1. Check the care team in [facility] and look for a) attending/consulting [facility] provider listed and b) the [facility] team listed 2. Log into [email or web address] and use [place]'s universal password to access. If you do not have the password, please contact the hospital operator. 3. Locate the [facility] provider you are looking for under [facility] and page to a number that you can be directly reached. 4. If you still have difficulty reaching the provider, please page the [facility] (Director on Call) for the Hospitalists listed on amion for assistance.  05/05/2020, 11:11 PM

## 2020-05-05 NOTE — ED Provider Notes (Signed)
[facility]ER[facility]ENC[facility] DEPARTMENT Provider Note   CSN: [id number] Arrival date & time: 05/05/20  1727     History Chief Complaint  Patient presents with  . Cerebrovascular Accident    Marissa Crawford is a 40 y.o. female.  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  She has a history of hypertension and is otherwise in very good health.  She does take losartan with some hydrochlorothiazide as her medication.  She reports that she got her booster shot for COVID-19 approximately a week and a half ago, on Monday, 6 days ago she woke up and felt like she did not have balance and was falling into the wall on the right side.  She has your family doctor for a visit, an MRI was eventually ordered and today showed that she has actually had a posterior ischemic infarct in her cerebellar area.  The patient does note that over the last several days she has had some discomfort in her neck and her shoulder, this is unprovoked, atraumatic and has been persistent.  She also has a feeling of a fullness in the right side of her temple.  She states that since that time she has no more difficulty with ambulation, she has no feeling of off balance, no changes in vision, no numbness or weakness to the arms or the legs, she was told to come to the hospital today because of this abnormal MRI finding.  Past Medical History:  Diagnosis Date  . Acute CVA (cerebrovascular accident) ([place]) 05/05/2020  . AKI (acute kidney injury) ([place]) 05/05/2020  . HTN (hypertension)     Patient Active Problem List   Diagnosis Date Noted  . Acute CVA (cerebrovascular accident) ([place]) 05/05/2020  . Hypokalemia 05/05/2020  . AKI (acute kidney injury) ([place]) 05/05/2020  . Obesity (BMI 30-39.9) 02/07/2019  . HTN (hypertension) 12/23/2016  . Indication for care in labor or delivery 03/25/2016  . SVD (spontaneous vaginal delivery) 03/25/2016    History reviewed. No  pertinent surgical history.   OB History    Gravida  1   Para  1   Term  1   Preterm      AB      Living  1     SAB      IAB      Ectopic      Multiple  0   Live Births  1           Family History  Problem Relation Age of Onset  . Cervical cancer Mother   . Cancer Mother 42       cervical  . Hypertension Father   . Alcohol abuse Father   . Gout Father   . Seizures Sister   . Diabetes Brother   . Stroke Maternal Grandfather   . Diabetes Maternal Grandfather   . Stroke Paternal Grandmother   . Diabetes Paternal Grandmother   . Diabetes Maternal Grandmother   . Diabetes Paternal Grandfather     Social History   Tobacco Use  . Smoking status: Current Every Day Smoker    Packs/day: 1.00    Years: 20.00    Pack years: 20.00    Types: Cigarettes    Start date: 2001  . Smokeless tobacco: Never Used  Substance Use Topics  . Alcohol use: Yes    Alcohol/week: 2.0 standard drinks    Types: 2 Standard drinks or equivalent per  week  . Drug use: No    Home Medications Prior to Admission medications   Medication Sig Start Date End Date Taking? Authorizing Provider  losartan-hydrochlorothiazide (HYZAAR) 100-25 MG tablet Take 1 tablet by mouth daily. 03/26/20  Yes Seabron SpatesLowne Chase, Yvonne R, DO  metoprolol succinate (TOPROL-XL) 100 MG 24 hr tablet Take 1 tablet (100 mg total) by mouth daily. Take with or immediately following a meal. Patient taking differently: Take 100 mg by mouth daily. 03/26/20  Yes Seabron SpatesLowne Chase, Yvonne R, DO  naproxen (NAPROSYN) 500 MG tablet Take 1 tablet (500 mg total) by mouth 2 (two) times daily. Patient taking differently: Take 500 mg by mouth daily as needed for moderate pain. 04/05/20  Yes Wieters, Hallie C, PA-C    Allergies    Patient has no known allergies.  Review of Systems   Review of Systems  All other systems reviewed and are negative.   Physical Exam Updated Vital Signs BP 130/75   Pulse 75   Temp 98 F (36.7 C)  (Oral)   Resp 18   Wt 82.4 kg   LMP 04/30/2020   SpO2 99%   BMI 34.33 kg/m   Physical Exam Vitals and nursing note reviewed.  Constitutional:      General: She is not in acute distress.    Appearance: She is well-developed and well-nourished.  HENT:     Head: Normocephalic and atraumatic.     Mouth/Throat:     Mouth: Oropharynx is clear and moist.     Pharynx: No oropharyngeal exudate.  Eyes:     General: No scleral icterus.       Right eye: No discharge.        Left eye: No discharge.     Extraocular Movements: EOM normal.     Conjunctiva/sclera: Conjunctivae normal.     Pupils: Pupils are equal, round, and reactive to light.  Neck:     Thyroid: No thyromegaly.     Vascular: No JVD.  Cardiovascular:     Rate and Rhythm: Normal rate and regular rhythm.     Pulses: Intact distal pulses.     Heart sounds: Normal heart sounds. No murmur heard. No friction rub. No gallop.   Pulmonary:     Effort: Pulmonary effort is normal. No respiratory distress.     Breath sounds: Normal breath sounds. No wheezing or rales.  Abdominal:     General: Bowel sounds are normal. There is no distension.     Palpations: Abdomen is soft. There is no mass.     Tenderness: There is no abdominal tenderness.  Musculoskeletal:        General: No tenderness or edema. Normal range of motion.     Cervical back: Normal range of motion and neck supple.  Lymphadenopathy:     Cervical: No cervical adenopathy.  Skin:    General: Skin is warm and dry.     Findings: No erythema or rash.  Neurological:     Mental Status: She is alert.     Coordination: Coordination normal.     Comments: Speech is clear, cranial nerves III through XII are intact, memory is intact, strength is normal in all 4 extremities including grips, sensation is intact to light touch and pinprick in all 4 extremities. Coordination as tested by finger-nose-finger is normal, no limb ataxia. Normal gait, normal reflexes at the patellar  tendons bilaterally  Psychiatric:        Mood and Affect: Mood and affect normal.  Behavior: Behavior normal.     ED Results / Procedures / Treatments   Labs (all labs ordered are listed, but only abnormal results are displayed) Labs Reviewed  COMPREHENSIVE METABOLIC PANEL - Abnormal; Notable for the following components:      Result Value   Potassium 3.1 (*)    Creatinine, Ser 1.17 (*)    Total Protein 8.2 (*)    AST 42 (*)    All other components within normal limits  HEMOGLOBIN A1C - Abnormal; Notable for the following components:   Hgb A1c MFr Bld 5.9 (*)    All other components within normal limits  LIPID PANEL - Abnormal; Notable for the following components:   HDL 22 (*)    LDL Cholesterol 139 (*)    All other components within normal limits  BASIC METABOLIC PANEL - Abnormal; Notable for the following components:   Glucose, Bld 153 (*)    All other components within normal limits  C-REACTIVE PROTEIN - Abnormal; Notable for the following components:   CRP 5.6 (*)    All other components within normal limits  SARS CORONAVIRUS 2 (TAT 6-24 HRS)  PROTIME-INR  APTT  CBC  DIFFERENTIAL  HIV ANTIBODY (ROUTINE TESTING W REFLEX)  MAGNESIUM  HOMOCYSTEINE  ANTIPHOSPHOLIPID SYNDROME EVAL, BLD  RPR  I-STAT BETA HCG BLOOD, ED ([facility], [facility], [facility] ONLY)    EKG None  Radiology CT Angio Head W or Wo Contrast  Result Date: 05/05/2020 CLINICAL DATA:  Initial evaluation for acute stroke. EXAM: CT ANGIOGRAPHY HEAD AND NECK TECHNIQUE: Multidetector CT imaging of the head and neck was performed using the standard protocol during bolus administration of intravenous contrast. Multiplanar CT image reconstructions and MIPs were obtained to evaluate the vascular anatomy. Carotid stenosis measurements (when applicable) are obtained utilizing NASCET criteria, using the distal internal carotid diameter as the denominator. CONTRAST:  31mL OMNIPAQUE IOHEXOL 350 MG/ML SOLN COMPARISON:  Prior MRI  from earlier the same day. FINDINGS: CT HEAD FINDINGS Brain: Evolving right PICA territory infarct again seen, stable from previous MRI. No associated hemorrhage or significant regional mass effect. Remainder the brain is normal in appearance. No other acute large vessel territory infarct or hemorrhage. No mass lesion, midline shift or significant mass effect. No extra-axial fluid collection. No hydrocephalus. Vascular: No hyperdense vessel. Skull: Scalp soft tissues and calvarium within normal limits. Sinuses: Paranasal sinuses are largely clear.  No mastoid effusion. Orbits: Globes orbital soft tissues within normal limits. Review of the MIP images confirms the above findings CTA NECK FINDINGS Aortic arch: Visualized aortic arch of normal caliber with normal branch pattern. No hemodynamically significant stenosis seen about the origin of the great vessels. Visualized subclavian arteries widely patent. Right carotid system: Right common and internal carotid arteries patent without stenosis, dissection or occlusion. Mild eccentric calcified plaque at the right bifurcation without significant stenosis. Left carotid system: Left common and internal carotid arteries widely patent without stenosis, dissection or occlusion. Vertebral arteries: Both vertebral arteries arise from the subclavian arteries. Left vertebral artery widely patent within the neck without stenosis, dissection or occlusion. Right vertebral artery occluded at its origin, and remains occluded within the neck. Skeleton: No visible acute osseous abnormality. No discrete or worrisome osseous lesions. Other neck: No other acute soft tissue abnormality within the neck. No mass or adenopathy. Upper chest: Visualized upper chest demonstrates no acute finding. Review of the MIP images confirms the above findings CTA HEAD FINDINGS Anterior circulation: Petrous segments widely patent. Minimal atheromatous plaque within the carotid  siphons without significant  stenosis. A1 segments patent bilaterally. Normal anterior communicating artery complex. Anterior cerebral arteries patent to their distal aspects without stenosis. Normal in stenosis or occlusion. Normal MCA bifurcations. Distal MCA branches well perfused and symmetric. Posterior circulation: Left vertebral artery widely patent to the vertebrobasilar junction. Right vertebral artery occluded as it courses into the cranial vault. Retrograde filling of the distal right V4 segment via collateral flow across the vertebrobasilar junction. Perfusion of the origin/proximal aspect of the right PICA. Left PICA patent as well. Basilar patent to its distal aspect without stenosis. Superior cerebellar arteries patent bilaterally. Both PCA supplied via the basilar as well as small bilateral posterior communicating arteries. Both PCAs well perfused to their distal aspects. Venous sinuses: Patent allowing for timing the contrast bolus. Anatomic variants: None significant. Review of the MIP images confirms the above findings IMPRESSION: CT HEAD IMPRESSION: 1. Evolving acute right PICA territory infarct, stable from previous MRI. No associated hemorrhage or significant regional mass effect. 2. Otherwise negative head CT. No other acute intracranial abnormality. CTA HEAD AND NECK IMPRESSION: 1. Occlusion of the right vertebral artery at its origin, suspected to be acute in nature given the acute right PICA territory infarct, possibly related to dissection. Distal reconstitution of the right V4 segment via retrograde filling across the vertebrobasilar junction. The proximal right PICA is perfused. 2. Otherwise negative CTA of the head and neck. No other large vessel occlusion, hemodynamically significant stenosis, or other acute vascular abnormality. Electronically Signed   By: Jeannine Boga M.D.   On: 05/05/2020 21:42   CT Angio Neck W and/or Wo Contrast  Result Date: 05/05/2020 CLINICAL DATA:  Initial evaluation for acute  stroke. EXAM: CT ANGIOGRAPHY HEAD AND NECK TECHNIQUE: Multidetector CT imaging of the head and neck was performed using the standard protocol during bolus administration of intravenous contrast. Multiplanar CT image reconstructions and MIPs were obtained to evaluate the vascular anatomy. Carotid stenosis measurements (when applicable) are obtained utilizing NASCET criteria, using the distal internal carotid diameter as the denominator. CONTRAST:  57mL OMNIPAQUE IOHEXOL 350 MG/ML SOLN COMPARISON:  Prior MRI from earlier the same day. FINDINGS: CT HEAD FINDINGS Brain: Evolving right PICA territory infarct again seen, stable from previous MRI. No associated hemorrhage or significant regional mass effect. Remainder the brain is normal in appearance. No other acute large vessel territory infarct or hemorrhage. No mass lesion, midline shift or significant mass effect. No extra-axial fluid collection. No hydrocephalus. Vascular: No hyperdense vessel. Skull: Scalp soft tissues and calvarium within normal limits. Sinuses: Paranasal sinuses are largely clear.  No mastoid effusion. Orbits: Globes orbital soft tissues within normal limits. Review of the MIP images confirms the above findings CTA NECK FINDINGS Aortic arch: Visualized aortic arch of normal caliber with normal branch pattern. No hemodynamically significant stenosis seen about the origin of the great vessels. Visualized subclavian arteries widely patent. Right carotid system: Right common and internal carotid arteries patent without stenosis, dissection or occlusion. Mild eccentric calcified plaque at the right bifurcation without significant stenosis. Left carotid system: Left common and internal carotid arteries widely patent without stenosis, dissection or occlusion. Vertebral arteries: Both vertebral arteries arise from the subclavian arteries. Left vertebral artery widely patent within the neck without stenosis, dissection or occlusion. Right vertebral artery  occluded at its origin, and remains occluded within the neck. Skeleton: No visible acute osseous abnormality. No discrete or worrisome osseous lesions. Other neck: No other acute soft tissue abnormality within the neck. No mass or adenopathy. Upper  chest: Visualized upper chest demonstrates no acute finding. Review of the MIP images confirms the above findings CTA HEAD FINDINGS Anterior circulation: Petrous segments widely patent. Minimal atheromatous plaque within the carotid siphons without significant stenosis. A1 segments patent bilaterally. Normal anterior communicating artery complex. Anterior cerebral arteries patent to their distal aspects without stenosis. Normal in stenosis or occlusion. Normal MCA bifurcations. Distal MCA branches well perfused and symmetric. Posterior circulation: Left vertebral artery widely patent to the vertebrobasilar junction. Right vertebral artery occluded as it courses into the cranial vault. Retrograde filling of the distal right V4 segment via collateral flow across the vertebrobasilar junction. Perfusion of the origin/proximal aspect of the right PICA. Left PICA patent as well. Basilar patent to its distal aspect without stenosis. Superior cerebellar arteries patent bilaterally. Both PCA supplied via the basilar as well as small bilateral posterior communicating arteries. Both PCAs well perfused to their distal aspects. Venous sinuses: Patent allowing for timing the contrast bolus. Anatomic variants: None significant. Review of the MIP images confirms the above findings IMPRESSION: CT HEAD IMPRESSION: 1. Evolving acute right PICA territory infarct, stable from previous MRI. No associated hemorrhage or significant regional mass effect. 2. Otherwise negative head CT. No other acute intracranial abnormality. CTA HEAD AND NECK IMPRESSION: 1. Occlusion of the right vertebral artery at its origin, suspected to be acute in nature given the acute right PICA territory infarct, possibly  related to dissection. Distal reconstitution of the right V4 segment via retrograde filling across the vertebrobasilar junction. The proximal right PICA is perfused. 2. Otherwise negative CTA of the head and neck. No other large vessel occlusion, hemodynamically significant stenosis, or other acute vascular abnormality. Electronically Signed   By: Jeannine Boga M.D.   On: 05/05/2020 21:42   MR Brain Wo Contrast  Addendum Date: 05/05/2020   ADDENDUM REPORT: 05/05/2020 15:47 ADDENDUM: These results were called by telephone at the time of interpretation on 05/05/2020 at 3:47 pm to provider Einar Pheasant , who verbally acknowledged these results. Electronically Signed   By: Franchot Gallo M.D.   On: 05/05/2020 15:47   Result Date: 05/05/2020 CLINICAL DATA:  Headache, dizziness, ataxia. EXAM: MRI HEAD WITHOUT CONTRAST TECHNIQUE: Multiplanar, multiecho pulse sequences of the brain and surrounding structures were obtained without intravenous contrast. COMPARISON:  None. FINDINGS: Brain: Acute infarct in the right PICA territory. Moderately large cerebellar infarct is present without hemorrhage. No other acute infarct. No evidence of chronic ischemia Ventricle size normal.  Negative for hemorrhage or mass. Vascular: Normal arterial flow voids. Skull and upper cervical spine: Negative Sinuses/Orbits: Paranasal sinuses clear.  Negative orbit Other: None IMPRESSION: Acute infarct right PICA territory. Negative for hemorrhage. Otherwise negative. Recommend further vascular evaluation to rule out vertebral dissection or embolic source. Electronically Signed: By: Franchot Gallo M.D. On: 05/05/2020 15:39    Procedures Procedures   Medications Ordered in ED Medications  aspirin EC tablet 325 mg (325 mg Oral Given 05/06/20 0958)   stroke: mapping our early stages of recovery book (0 each Does not apply Hold 05/05/20 2315)  acetaminophen (TYLENOL) tablet 650 mg (has no administration in time range)    Or  acetaminophen  (TYLENOL) 160 MG/5ML solution 650 mg (has no administration in time range)    Or  acetaminophen (TYLENOL) suppository 650 mg (has no administration in time range)  losartan (COZAAR) tablet 100 mg (100 mg Oral Not Given 05/06/20 0957)  hydrochlorothiazide (HYDRODIURIL) tablet 25 mg (25 mg Oral Not Given 05/06/20 0957)  metoprolol succinate (TOPROL-XL) 24 hr  tablet 100 mg (100 mg Oral Not Given 05/06/20 0958)  clopidogrel (PLAVIX) tablet 75 mg (75 mg Oral Given 05/06/20 1218)  sodium chloride flush (NS) 0.9 % injection 3 mL (3 mLs Intravenous Given 05/05/20 2234)  iohexol (OMNIPAQUE) 350 MG/ML injection 75 mL (75 mLs Intravenous Contrast Given 05/05/20 2014)  aspirin EC tablet 650 mg (650 mg Oral Given 05/05/20 2232)  sodium chloride 0.9 % bolus 500 mL (0 mLs Intravenous Stopped 05/06/20 0056)  potassium chloride SA (KLOR-CON) CR tablet 40 mEq (40 mEq Oral Given 05/06/20 0007)    ED Course  I have reviewed the triage vital signs and the nursing notes.  Pertinent labs & imaging results that were available during my care of the patient were reviewed by me and considered in my medical decision making (see chart for details).  Clinical Course as of 05/06/20 1458  Sat May 05, 2020  2037 Discussed with Dr. Cheral Marker who will see patient in consultation [BM]    Clinical Course User Index [BM] Noemi Chapel, MD   MDM Rules/Calculators/A&P                          This patient's laboratory work-up is unremarkable, I have consulted with neuro hospitalist, they will call back shortly, I think a CT angiogram is appropriate to make sure there is not a vascular dissection  The CT angiogram does not fact show that there is lack of vertebral artery flow which is suspected to be related to a dissection or an acute occlusion.  Dr. Cheral Marker with the neurology service has seen the patient in consultation recommends aspirin and admission for the rest of the work-up that the patient needs including cardiac  monitoring.  The labs thankfully are unremarkable, her blood pressure is improved at 147/73  This patient is critically ill with an acute vascular dissection which is caused an acute ischemic stroke.  Hospitalist paged for admission  D/w hosptialist who has agreed to admit.  Final Clinical Impression(s) / ED Diagnoses Final diagnoses:  Vertebral artery dissection ([place])  Acute ischemic stroke ([facility])      Noemi Chapel, MD 05/06/20 [id number]

## 2020-05-06 DIAGNOSIS — Z716 Tobacco abuse counseling: Secondary | ICD-10-CM | POA: Diagnosis not present

## 2020-05-06 DIAGNOSIS — R29700 NIHSS score 0: Secondary | ICD-10-CM | POA: Diagnosis present

## 2020-05-06 DIAGNOSIS — I7774 Dissection of vertebral artery: Secondary | ICD-10-CM | POA: Diagnosis present

## 2020-05-06 DIAGNOSIS — Z79899 Other long term (current) drug therapy: Secondary | ICD-10-CM | POA: Diagnosis not present

## 2020-05-06 DIAGNOSIS — R760 Raised antibody titer: Secondary | ICD-10-CM | POA: Diagnosis present

## 2020-05-06 DIAGNOSIS — R270 Ataxia, unspecified: Secondary | ICD-10-CM | POA: Diagnosis present

## 2020-05-06 DIAGNOSIS — I6501 Occlusion and stenosis of right vertebral artery: Secondary | ICD-10-CM | POA: Diagnosis present

## 2020-05-06 DIAGNOSIS — Z6835 Body mass index (BMI) 35.0-35.9, adult: Secondary | ICD-10-CM | POA: Diagnosis not present

## 2020-05-06 DIAGNOSIS — Z811 Family history of alcohol abuse and dependence: Secondary | ICD-10-CM | POA: Diagnosis not present

## 2020-05-06 DIAGNOSIS — N179 Acute kidney failure, unspecified: Secondary | ICD-10-CM | POA: Diagnosis present

## 2020-05-06 DIAGNOSIS — Z8673 Personal history of transient ischemic attack (TIA), and cerebral infarction without residual deficits: Secondary | ICD-10-CM | POA: Diagnosis not present

## 2020-05-06 DIAGNOSIS — Z20822 Contact with and (suspected) exposure to covid-19: Secondary | ICD-10-CM | POA: Diagnosis present

## 2020-05-06 DIAGNOSIS — Z823 Family history of stroke: Secondary | ICD-10-CM | POA: Diagnosis not present

## 2020-05-06 DIAGNOSIS — I63541 Cerebral infarction due to unspecified occlusion or stenosis of right cerebellar artery: Secondary | ICD-10-CM | POA: Diagnosis present

## 2020-05-06 DIAGNOSIS — E876 Hypokalemia: Secondary | ICD-10-CM | POA: Diagnosis present

## 2020-05-06 DIAGNOSIS — I10 Essential (primary) hypertension: Secondary | ICD-10-CM | POA: Diagnosis present

## 2020-05-06 DIAGNOSIS — E785 Hyperlipidemia, unspecified: Secondary | ICD-10-CM | POA: Diagnosis present

## 2020-05-06 DIAGNOSIS — I639 Cerebral infarction, unspecified: Secondary | ICD-10-CM | POA: Diagnosis not present

## 2020-05-06 DIAGNOSIS — Z791 Long term (current) use of non-steroidal anti-inflammatories (NSAID): Secondary | ICD-10-CM | POA: Diagnosis not present

## 2020-05-06 DIAGNOSIS — Z8049 Family history of malignant neoplasm of other genital organs: Secondary | ICD-10-CM | POA: Diagnosis not present

## 2020-05-06 DIAGNOSIS — Z833 Family history of diabetes mellitus: Secondary | ICD-10-CM | POA: Diagnosis not present

## 2020-05-06 DIAGNOSIS — Z8249 Family history of ischemic heart disease and other diseases of the circulatory system: Secondary | ICD-10-CM | POA: Diagnosis not present

## 2020-05-06 DIAGNOSIS — F17210 Nicotine dependence, cigarettes, uncomplicated: Secondary | ICD-10-CM | POA: Diagnosis present

## 2020-05-06 DIAGNOSIS — E875 Hyperkalemia: Secondary | ICD-10-CM | POA: Diagnosis present

## 2020-05-06 LAB — LIPID PANEL
Cholesterol: 188 mg/dL (ref 0–200)
HDL: 22 mg/dL — ABNORMAL LOW (ref >40)
LDL Cholesterol: 139 mg/dL — ABNORMAL HIGH (ref 0–99)
Total CHOL/HDL Ratio: 8.5 RATIO
Triglycerides: 134 mg/dL (ref <150)
VLDL: 27 mg/dL (ref 0–40)

## 2020-05-06 LAB — BASIC METABOLIC PANEL
Anion gap: 10 (ref 5–15)
BUN: 13 mg/dL (ref 6–20)
CO2: 24 mmol/L (ref 22–32)
Calcium: 9.2 mg/dL (ref 8.9–10.3)
Chloride: 102 mmol/L (ref 98–111)
Creatinine, Ser: 0.94 mg/dL (ref 0.44–1.00)
GFR, Estimated: >60 mL/min (ref >60)
Glucose, Bld: 153 mg/dL — ABNORMAL HIGH (ref 70–99)
Potassium: 3.7 mmol/L (ref 3.5–5.1)
Sodium: 136 mmol/L (ref 135–145)

## 2020-05-06 LAB — HEMOGLOBIN A1C
Hgb A1c MFr Bld: 5.9 % — ABNORMAL HIGH (ref 4.8–5.6)
Mean Plasma Glucose: 122.63 mg/dL

## 2020-05-06 LAB — C-REACTIVE PROTEIN: CRP: 5.6 mg/dL — ABNORMAL HIGH (ref <1.0)

## 2020-05-06 LAB — SARS CORONAVIRUS 2 (TAT 6-24 HRS): SARS Coronavirus 2: NEGATIVE

## 2020-05-06 MED ORDER — CLOPIDOGREL BISULFATE 75 MG PO TABS
75.0000 mg | ORAL_TABLET | Freq: Every day | ORAL | Status: DC
  Administered 2020-05-06 – 2020-05-08 (×3): 75 mg via ORAL
  Filled 2020-05-06 (×3): qty 1

## 2020-05-06 MED ORDER — METOPROLOL SUCCINATE ER 100 MG PO TB24
100.0000 mg | ORAL_TABLET | Freq: Every day | ORAL | Status: DC
  Administered 2020-05-08: 100 mg via ORAL
  Filled 2020-05-06 (×2): qty 1

## 2020-05-06 NOTE — Evaluation (Signed)
Occupational Therapy Evaluation and Discharge Patient Details Name: Marissa Crawford MRN: [id number] DOB: [birth year] Today's Date: 05/06/2020    History of Present Illness Pt is a 40 y.o. F with significant PMH of tobacco use, HTN who presents with new right sided decreased coordination and ataxia. MRI showing subacute right cerebellar stroke, large in size.   Clinical Impression   This 40 y/o female presents with the above. PTA pt independent with ADL, iADL and mobility tasks. Today pt demonstrating mobility and ability to perform ADL tasks at mod independent - independent level. Pt reports initial symptoms have since subsided and with none noted during today's assessment. Pt with symmetrical strength and no coordination deficits. Pt also confirms feeling back to her baseline. All questions answered with no further acute OT needs identified. Acute OT to sign off at this time, do not anticipate she will require follow up OT services after discharge. Please re-consult should pt's needs change.     Follow Up Recommendations  No OT follow up    Equipment Recommendations  None recommended by OT           Precautions / Restrictions Precautions Precautions: None Restrictions Weight Bearing Restrictions: No      Mobility Bed Mobility Overal bed mobility: Independent                  Transfers Overall transfer level: Independent Equipment used: None                  Balance Overall balance assessment: No apparent balance deficits (not formally assessed)                               Standardized Balance Assessment Standardized Balance Assessment : Dynamic Gait Index   Dynamic Gait Index Level Surface: Normal Change in Gait Speed: Normal Gait with Horizontal Head Turns: Normal Gait with Vertical Head Turns: Normal Gait and Pivot Turn: Normal Step Over Obstacle: Normal Step Around Obstacles: Normal Steps: Normal Total Score: 24     ADL either  performed or assessed with clinical judgement   ADL Overall ADL's : At baseline                                       General ADL Comments: pt mobilizing and demonstrating ability to perform ADL tasks at mod independent to independent level today. pt reports feeling back to her baseline with no remaining deficits noted     Vision Baseline Vision/History: No visual deficits Patient Visual Report: No change from baseline Vision Assessment?: No apparent visual deficits     Perception     Praxis      Pertinent Vitals/Pain Pain Assessment: No/denies pain     Hand Dominance     Extremity/Trunk Assessment Upper Extremity Assessment Upper Extremity Assessment: Overall WFL for tasks assessed (symmetrical strength, coordination WFL)   Lower Extremity Assessment Lower Extremity Assessment: Defer to PT evaluation RLE Deficits / Details: Strength 5/5 LLE Deficits / Details: Strength 5/5   Cervical / Trunk Assessment Cervical / Trunk Assessment: Normal   Communication Communication Communication: No difficulties   Cognition Arousal/Alertness: Awake/alert Behavior During Therapy: WFL for tasks assessed/performed Overall Cognitive Status: Within Functional Limits for tasks assessed  General Comments       Exercises     Shoulder Instructions      Home Living Family/patient expects to be discharged to:: Private residence Living Arrangements: Children (4 y.o. 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[facility]        OT Problem List:        OT Treatment/Interventions:      OT Goals(Current goals can be found in the care plan section) Acute Rehab OT Goals Patient Stated Goal: go home OT Goal Formulation: All assessment and education complete, DC  therapy  OT Frequency:     Barriers to D/C:            Co-evaluation              AM-PAC OT "6 Clicks" Daily Activity     Outcome Measure Help from another person eating meals?: None Help from another person taking care of personal grooming?: None Help from another person toileting, which includes using toliet, bedpan, or urinal?: None Help from another person bathing (including washing, rinsing, drying)?: None Help from another person to put on and taking off regular upper body clothing?: None Help from another person to put on and taking off regular lower body clothing?: None 6 Click Score: 24   End of Session Nurse Communication: Mobility status  Activity Tolerance: Patient tolerated treatment well Patient left: in bed;with call bell/phone within reach  OT Visit Diagnosis: Other symptoms and signs involving the nervous system (T90.300)                Time: 9233-0076 OT Time Calculation (min): 13 min Charges:  OT General Charges $OT Visit: 1 Visit  Lou Cal, OT Acute Rehabilitation Services Pager [phone] Office [phone]  Raymondo Band 05/06/2020, 10:51 AM

## 2020-05-06 NOTE — Progress Notes (Signed)
STROKE TEAM PROGRESS NOTE   HISTORY OF PRESENT ILLNESS (per record) Marissa Crawford is an 40 y.o. female with a PMHx of HTN and smoking who presents to the ED for evaluation of new onset right sided incoordination with right sided ataxia beginning on Monday. 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 When she got up, she was dizzy and her gait was unsteady. Her right leg felt heavy as well. She did not have any confusion, vision changes, slurred speech, RUE weakness or facial droop. She states that the only unusual occurrence prior to symptom onset was that her 41 year old daughter had been sleeping on her right arm. The patient called her PCP on Monday and she was advised to go to the ER for further evaluation, but the patient decided instead to go to work. Her supervisor noted that she seemed ill, but patient felt that her symptoms would go away. On Tuesday she was evaluated by her PCP via telemedicine and an MRI brain was recommended. She states that she was "moving slowly\" at work for the first two days, but by Wednesday her symptoms had resolved. Her outpatient MRI was completed today, revealing a large right cerebellar ischemic infarction. She presented to the ED for further evaluation.  LSN: Monday afternoon tPA Given: No: Out of the time window   INTERVAL HISTORY She reports improvement of the right lower extremity and balance.  She did have a brief severe attack of right right-sided headache and right jaw pain a few days ago but this has resolved.  No history of motor vehicle accident or neck injury or head injuries.  She does smoke but no history of birth control.    OBJECTIVE Vitals:   05/06/20 0335 05/06/20 0524 05/06/20 0623 05/06/20 0939  BP: 120/73 110/67 (!) 153/72 130/75  Pulse: 76 80 81 75  Resp: 18 19 17 18   Temp: 98.4 F (36.9 C)   98  F (36.7 C)  TempSrc: Oral   Oral  SpO2: 98% 100% 99% 99%    CBC:  Recent Labs  Lab 05/04/20 0905 05/05/20 1831  WBC 8.1 8.8  NEUTROABS 4.9 4.9  HGB 12.6 13.3  HCT 37.1 38.8  MCV 97.3 96.3  PLT 332.0 0000000    Basic Metabolic Panel:  Recent Labs  Lab 05/05/20 1831 05/06/20 1010  NA 137 136  K 3.1* 3.7  CL 98 102  CO2 24 24  GLUCOSE 89 153*  BUN 14 13  CREATININE 1.17* 0.94  CALCIUM 9.6 9.2    Lipid Panel:     Component Value Date/Time   CHOL 188 05/06/2020 0516   TRIG 134 05/06/2020 0516   HDL 22 (L) 05/06/2020 0516   CHOLHDL 8.5 05/06/2020 0516   VLDL 27 05/06/2020 0516   LDLCALC 139 (H) 05/06/2020 0516   HgbA1c:  Lab Results  Component Value Date   HGBA1C 5.9 (H) 05/06/2020   Urine Drug Screen: No results found for: LABOPIA, COCAINSCRNUR, LABBENZ, AMPHETMU, THCU, LABBARB  Alcohol Level No results found for: ETH  IMAGING  CT Angio Head W or Wo Contrast  CT Angio Neck W and/or Wo Contrast 05/05/2020  CT HEAD  IMPRESSION:  1. Evolving acute right PICA territory infarct, stable from previous MRI. No associated hemorrhage or significant regional mass effect.  2. Otherwise negative head CT. No other acute intracranial  abnormality.   CTA HEAD AND NECK  IMPRESSION:  1. Occlusion of the right vertebral artery at its origin, suspected to be acute in nature given the acute right PICA territory infarct, possibly related to dissection. Distal reconstitution of the right V4 segment via retrograde filling across the vertebrobasilar junction. The proximal right PICA is perfused.  2. Otherwise negative CTA of the head and neck. No other large vessel occlusion, hemodynamically significant stenosis, or other acute vascular abnormality.   MR Brain Wo Contrast 05/05/2020   IMPRESSION:  Acute infarct right PICA territory. Negative for hemorrhage. Otherwise negative. Recommend further vascular evaluation to rule out vertebral dissection or embolic source.    Transthoracic Echocardiogram  04/12/2020 IMPRESSIONS  1. Left ventricular ejection fraction, by estimation, is 60 to 65%. The  left ventricle has normal function. The left ventricle has no regional  wall motion abnormalities. Left ventricular diastolic parameters were  normal.  2. Right ventricular systolic function is normal. The right ventricular  size is normal. Tricuspid regurgitation signal is inadequate for assessing  PA pressure.  3. The mitral valve is normal in structure. No evidence of mitral valve  regurgitation.  4. The aortic valve was not well visualized. Aortic valve regurgitation  is not visualized.  5. The inferior vena cava is normal in size with greater than 50%  respiratory variability, suggesting right atrial pressure of 3 mmHg.  Conclusion(s)/Recommendation(s): Normal biventricular function without  evidence of hemodynamically significant valvular heart disease.   ECG - SR rate 99 BPM. (See cardiology reading for complete details)  PHYSICAL EXAM Blood pressure 130/75, pulse 75, temperature 98 F (36.7 C), temperature source Oral, resp. rate 18, last menstrual period 04/30/2020, SpO2 99 %, currently breastfeeding. GENERAL: She is doing well at this time.  HEENT: No scalp tenderness noted.  Neck is supple and no trauma noted.  EXTREMITIES: No edema   BACK: Normal  SKIN: Normal by inspection.    MENTAL STATUS: Alert and oriented. Speech, language and cognition are generally intact. Judgment and insight normal.   CRANIAL NERVES: Pupils are equal, round and reactive to light and accomodation; extra ocular movements are full, there is no significant nystagmus; visual fields are full; upper and lower facial muscles are normal in strength and symmetric, there is no flattening of the nasolabial folds; tongue is midline; uvula is midline; shoulder elevation is normal.  MOTOR: Normal tone, bulk and strength; no pronator drift.  There is no drift of the upper or  lower extremities.  COORDINATION: Left finger to nose is normal, right finger to nose is normal, No rest tremor; no intention tremor; no postural tremor; no bradykinesia.  SENSATION: Normal to light touch, temperature, and pain.       ASSESSMENT/PLAN Ms. Marissa Crawford is a 40 y.o. female with a history of ongoing tobacco use and hypertension who awoke Monday with a right sided HA, dizziness, right sided incoordination, right sided ataxia with right neck and shoulder discomfort. The sxs resolved over several days but on 05/05/20 an outpt MRI revealed a large right cerebellar ischemic infarction. She did not receive IV t-PA due to late presentation (>4.5 hours from time of onset).  Stroke: Acute infarct right PICA territory - embolic - likely due to dissection of right vertebral artery.  Resultant mild ataxia.  Code Stroke CT Head - not ordered  CT head - Evolving acute right PICA territory infarct, stable from previous MRI. No associated hemorrhage or significant regional mass effect. Otherwise negative head CT. No other  acute intracranial abnormality.   MRI head - Acute infarct right PICA territory. Negative for hemorrhage. Otherwise negative. Recommend further vascular evaluation to rule out vertebral dissection or embolic source.  MRA head - not ordered  CTA H&N - Occlusion of the right vertebral artery at its origin, suspected to be acute in nature given the acute right PICA territory infarct, possibly related to dissection. Distal reconstitution of the right V4 segment via retrograde filling across the vertebrobasilar junction. The proximal right PICA is perfused. Otherwise negative CTA of the head and neck. No other large vessel occlusion, hemodynamically significant stenosis, or other acute vascular abnormality.   CT Perfusion - not ordered  Vasculitis labs - pending ; Sed rate 121 (H)  Carotid Doppler - CTA neck ordered - carotid dopplers not indicated.  2D Echo - EF 60 - 65%.  No cardiac source of emboli identified.   Sars Corona Virus 2 - negative  LDL - 139  HgbA1c - 5.9  UDS - not ordered  VTE prophylaxis - SCDs Diet  Diet Order            Diet Heart Room service appropriate? Yes; Fluid consistency: Thin  Diet effective now                  No antithrombotic prior to admission, now on aspirin 325 mg daily and clopidogrel 75 mg daily  Patient will be counseled to be compliant with her antithrombotic medications  Ongoing aggressive stroke risk factor management  Therapy recommendations:  No PT or OT f/u recommended  Disposition:  Pending  Hypertension  Home BP meds: HCTZ ; Losartan ; metoprolol  Current BP meds: HCTZ ; Losartan ; metoprolol  Stable . Permissive hypertension (OK if < 220/120) but gradually normalize in 5-7 days  . Long-term BP goal normotensive  Hyperlipidemia  Home Lipid lowering medication: none  LDL 139, goal < 70  Current lipid lowering medication: none - statins contraindicated due to lactation.  Continue statin at discharge  Other Stroke Risk Factors  Cigarette smoker - advised to stop smoking  ETOH use, advised to drink no more than 1 alcoholic beverage per day.  Obesity, recommend weight loss, diet and exercise as appropriate   Family hx stroke (maternal grandfather and paternal grandmother)  Other Active Problems, Findings, Recommendations and/or Plan  Code status - Full code  Hospital day # 0  Stroke in a young person with history of hypertension and nicotine use.  I did discuss nicotine cessation with the patient.  A TEE is recommended and has been arranged.  There is suspicion of possible associated vasculitis although headaches resolved spontaneously after a day.  Additional labs will be obtained.   To contact Stroke Continuity provider, please refer to [email or web address] After hours, contact General Neurology

## 2020-05-06 NOTE — Progress Notes (Signed)
Dr. Gardiner Rhyme relayed request for TEE for stroke - Monday schedule is full but sent message to cardmaster box to help arrange for Tuesday.  Kennede Lusk PA-C

## 2020-05-06 NOTE — Progress Notes (Signed)
PROGRESS NOTE    Marissa Crawford I Rorke  ZOX:[id number] DOB: 12/06/1980 DOA: 05/05/2020 PCP: Marissa SchultzLowne Chase, Marissa R, DO   Chief Complaint  Patient presents with  . Cerebrovascular Accident  Brief Narrative: 40 year old female with history of hypertension, obesity, tobacco use presented with right-sided decreased coordination over the past week and outpatient MRI showing CVA. As per the report Patient awoke on Monday 04/30/20 with a headache, right neck and shoulder pain.  She also noted that she was unsteady on her feet and had some associated dizziness on standing.  She also noted some heaviness of her right leg at that time.She presented to the ED per pcp advise for further evaluation however patient felt like she can work through it and went to work.  At work she was working slower and this lasted for a couple of days and got better on Wednesday. Had televisit with her PCP on Tuesday and at that time PCP ordered an MRI. MRI  done on 1/29 demonstrated an acute right PICA territory infarct without hemorrhage.  Of note, patient works here at [employer] In ED-CTA performed in ED showed occlusion of the right vertebral artery suspected to be acute and possibly due to dissection.  Neurology was consulted in the ED. Patient was admitted for further monitoring work-up  Subjective:  Seen this am, very pleasant, well known to us, as one of our hospital staff nurse Patient feels okay,no new complaints. She is in ED hall way waiting for bed   Assessment & Plan:  Subacute right cerebellar stroke large in size: CTA showing occlusion of the right vertebral arteries suspected to be acute and possibly due to dissection, neurology on board, continue stroke work-up smoking cessation BP management, neurochecks, telemetry monitoring of PT OT speech eval. HbA1c 5.9, lipid panel ldl 139 , goal < 70. TTE 04/12/20- lvef 60-65%.Neuro recommending TEE. Repeat CTA in 6 months to assess for possible resolution.  On ASA 325 mg and  plaviz 75 mg. No statins 2/2 lactation. Vasculitis labs beign sent. Await neuro further plan.  HTN: controlled, cont her home losartan HCTZ and metoprolol. Hypokalemia: resolved AKI improved from 1.5-1 point 1 repeat labs Recent Labs  Lab 05/04/20 0905 05/05/20 1831 05/06/20 1010  BUN 20 14 13   CREATININE 1.57* 1.17* 0.94   Morbid Obesity w/ BMI 35: educated on wt loss, daily exercise. OP pcp f/u will be beneficial.  Nutrition: Diet Order            Diet Heart Room service appropriate? Yes; Fluid consistency: Thin  Diet effective now                 Pt's There is no height or weight on file to calculate BMI.  DVT prophylaxis: SCD's Start: 05/05/20 2258 Code Status:   Code Status: Full Code  Family Communication: plan of care discussed with patient at bedside.  Status is: admitted as Observation  Patient will need at least 2 midnight stay due to ongoing further work-up of her subacute stroke, changed to inpatient.  Dispo: The patient is from: Home              Anticipated d/c is to: Home              Anticipated d/c date is:1- 2 days              Patient currently is not medically stable to d/c.   Difficult to place patient No  Consultants:see note  Procedures:see note  Culture/Microbiology No  results found for: SDES, SPECREQUEST, CULT, REPTSTATUS  Other culture-see note  Medications: Scheduled Meds: .  stroke: mapping our early stages of recovery book   Does not apply Once  . aspirin EC  325 mg Oral Daily  . clopidogrel  75 mg Oral Q breakfast  . hydrochlorothiazide  25 mg Oral Daily  . losartan  100 mg Oral Daily  . metoprolol succinate  100 mg Oral Daily   Continuous Infusions:  Antimicrobials: Anti-infectives (From admission, onward)   None     Objective: Vitals: Today's Vitals   05/06/20 0335 05/06/20 0524 05/06/20 0623 05/06/20 0939  BP: 120/73 110/67 (!) 153/72 130/75  Pulse: 76 80 81 75  Resp: 18 19 17 18   Temp: 98.4 F (36.9 C)   98 F  (36.7 C)  TempSrc: Oral   Oral  SpO2: 98% 100% 99% 99%  PainSc: 0-No pain       Intake/Output Summary (Last 24 hours) at 05/06/2020 1349 Last data filed at 05/06/2020 0056 Gross per 24 hour  Intake 500 ml  Output --  Net 500 ml   There were no vitals filed for this visit. Weight change:   Intake/Output from previous day: 01/29 0701 - 01/30 0700 In: 500 [IV Piggyback:500] Out: -  Intake/Output this shift: No intake/output data recorded. There were no vitals filed for this visit.  Examination: General exam: AAOx3 ,NAD, pleasant,weak appearing. HEENT:Oral mucosa moist, Ear/Nose WNL grossly,dentition normal. Respiratory system: bilaterally clear,no wheezing or crackles,no use of accessory muscle, non tender. Cardiovascular system: S1 & S2 +, regular, No JVD. Gastrointestinal system: Abdomen soft, NT,ND, BS+. Nervous System:Alert, awake, moving extremities and grossly nonfocal Extremities: No edema, distal peripheral pulses palpable.  Skin: No rashes,no icterus. MSK: Normal muscle bulk,tone, power  Data Reviewed: I have personally reviewed following labs and imaging studies CBC: Recent Labs  Lab 05/04/20 0905 05/05/20 1831  WBC 8.1 8.8  NEUTROABS 4.9 4.9  HGB 12.6 13.3  HCT 37.1 38.8  MCV 97.3 96.3  PLT 332.0 0000000   Basic Metabolic Panel: Recent Labs  Lab 05/04/20 0905 05/05/20 1831 05/06/20 1010  NA 136 137 136  K 3.4* 3.1* 3.7  CL 99 98 102  CO2 28 24 24   GLUCOSE 103* 89 153*  BUN 20 14 13   CREATININE 1.57* 1.17* 0.94  CALCIUM 9.6 9.6 9.2   GFR: CrCl cannot be calculated (Unknown ideal weight.). Liver Function Tests: Recent Labs  Lab 05/04/20 0905 05/05/20 1831  AST 38* 42*  ALT 37* 38  ALKPHOS 70 67  BILITOT 0.5 0.4  PROT 7.8 8.2*  ALBUMIN 4.3 4.0   No results for input(s): LIPASE, AMYLASE in the last 168 hours. No results for input(s): AMMONIA in the last 168 hours. Coagulation Profile: Recent Labs  Lab 05/05/20 1831  INR 1.0   Cardiac  Enzymes: No results for input(s): CKTOTAL, CKMB, CKMBINDEX, TROPONINI in the last 168 hours. BNP (last 3 results) No results for input(s): PROBNP in the last 8760 hours. HbA1C: Recent Labs    05/06/20 0516  HGBA1C 5.9*   CBG: No results for input(s): GLUCAP in the last 168 hours. Lipid Profile: Recent Labs    05/06/20 0516  CHOL 188  HDL 22*  LDLCALC 139*  TRIG 134  CHOLHDL 8.5   Thyroid Function Tests: Recent Labs    05/04/20 0905  TSH 1.75   Anemia Panel: Recent Labs    05/04/20 0905  VITAMINB12 1,116*   Sepsis Labs: No results for input(s): PROCALCITON, LATICACIDVEN in  the last 168 hours.  Recent Results (from the past 240 hour(s))  SARS CORONAVIRUS 2 (TAT 6-24 HRS) Nasopharyngeal Nasopharyngeal Swab     Status: None   Collection Time: 05/05/20 11:21 PM   Specimen: Nasopharyngeal Swab  Result Value Ref Range Status   SARS Coronavirus 2 NEGATIVE NEGATIVE Final    Comment: (NOTE) SARS-CoV-2 target nucleic acids are NOT DETECTED.  The SARS-CoV-2 RNA is generally detectable in upper and lower respiratory specimens during the acute phase of infection. Negative results do not preclude SARS-CoV-2 infection, do not rule out co-infections with other pathogens, and should not be used as the sole basis for treatment or other patient management decisions. Negative results must be combined with clinical observations, patient history, and epidemiological information. The expected result is Negative.  Fact Sheet for Patients: SugarRoll.be  Fact Sheet for Healthcare Providers: https://www.woods-mathews.com/  This test is not yet approved or cleared by the [place] FDA and  has been authorized for detection and/or diagnosis of SARS-CoV-2 by FDA under an Emergency Use Authorization (EUA). This EUA will remain  in effect (meaning this test can be used) for the duration of the COVID-19 declaration under Se ction 564(b)(1) of  the Act, 21 U.S.C. section 360bbb-3(b)(1), unless the authorization is terminated or revoked sooner.  Performed at [facility], [place] [address] [place], [place] [address]      Radiology Studies: CT Angio Head W or Wo Contrast  Result Date: 05/05/2020 CLINICAL DATA:  Initial evaluation for acute stroke. EXAM: CT ANGIOGRAPHY HEAD AND NECK TECHNIQUE: Multidetector CT imaging of the head and neck was performed using the standard protocol during bolus administration of intravenous contrast. Multiplanar CT image reconstructions and MIPs were obtained to evaluate the vascular anatomy. Carotid stenosis measurements (when applicable) are obtained utilizing NASCET criteria, using the distal internal carotid diameter as the denominator. CONTRAST:  29mL OMNIPAQUE IOHEXOL 350 MG/ML SOLN COMPARISON:  Prior MRI from earlier the same day. FINDINGS: CT HEAD FINDINGS Brain: Evolving right PICA territory infarct again seen, stable from previous MRI. No associated hemorrhage or significant regional mass effect. Remainder the brain is normal in appearance. No other acute large vessel territory infarct or hemorrhage. No mass lesion, midline shift or significant mass effect. No extra-axial fluid collection. No hydrocephalus. Vascular: No hyperdense vessel. Skull: Scalp soft tissues and calvarium within normal limits. Sinuses: Paranasal sinuses are largely clear.  No mastoid effusion. Orbits: Globes orbital soft tissues within normal limits. Review of the MIP images confirms the above findings CTA NECK FINDINGS Aortic arch: Visualized aortic arch of normal caliber with normal branch pattern. No hemodynamically significant stenosis seen about the origin of the great vessels. Visualized subclavian arteries widely patent. Right carotid system: Right common and internal carotid arteries patent without stenosis, dissection or occlusion. Mild eccentric calcified plaque at the right bifurcation without significant stenosis.  Left carotid system: Left common and internal carotid arteries widely patent without stenosis, dissection or occlusion. Vertebral arteries: Both vertebral arteries arise from the subclavian arteries. Left vertebral artery widely patent within the neck without stenosis, dissection or occlusion. Right vertebral artery occluded at its origin, and remains occluded within the neck. Skeleton: No visible acute osseous abnormality. No discrete or worrisome osseous lesions. Other neck: No other acute soft tissue abnormality within the neck. No mass or adenopathy. Upper chest: Visualized upper chest demonstrates no acute finding. Review of the MIP images confirms the above findings CTA HEAD FINDINGS Anterior circulation: Petrous segments widely patent. Minimal atheromatous plaque within  the carotid siphons without significant stenosis. A1 segments patent bilaterally. Normal anterior communicating artery complex. Anterior cerebral arteries patent to their distal aspects without stenosis. Normal in stenosis or occlusion. Normal MCA bifurcations. Distal MCA branches well perfused and symmetric. Posterior circulation: Left vertebral artery widely patent to the vertebrobasilar junction. Right vertebral artery occluded as it courses into the cranial vault. Retrograde filling of the distal right V4 segment via collateral flow across the vertebrobasilar junction. Perfusion of the origin/proximal aspect of the right PICA. Left PICA patent as well. Basilar patent to its distal aspect without stenosis. Superior cerebellar arteries patent bilaterally. Both PCA supplied via the basilar as well as small bilateral posterior communicating arteries. Both PCAs well perfused to their distal aspects. Venous sinuses: Patent allowing for timing the contrast bolus. Anatomic variants: None significant. Review of the MIP images confirms the above findings IMPRESSION: CT HEAD IMPRESSION: 1. Evolving acute right PICA territory infarct, stable from  previous MRI. No associated hemorrhage or significant regional mass effect. 2. Otherwise negative head CT. No other acute intracranial abnormality. CTA HEAD AND NECK IMPRESSION: 1. Occlusion of the right vertebral artery at its origin, suspected to be acute in nature given the acute right PICA territory infarct, possibly related to dissection. Distal reconstitution of the right V4 segment via retrograde filling across the vertebrobasilar junction. The proximal right PICA is perfused. 2. Otherwise negative CTA of the head and neck. No other large vessel occlusion, hemodynamically significant stenosis, or other acute vascular abnormality. Electronically Signed   By: Jeannine Boga M.D.   On: 05/05/2020 21:42   CT Angio Neck W and/or Wo Contrast  Result Date: 05/05/2020 CLINICAL DATA:  Initial evaluation for acute stroke. EXAM: CT ANGIOGRAPHY HEAD AND NECK TECHNIQUE: Multidetector CT imaging of the head and neck was performed using the standard protocol during bolus administration of intravenous contrast. Multiplanar CT image reconstructions and MIPs were obtained to evaluate the vascular anatomy. Carotid stenosis measurements (when applicable) are obtained utilizing NASCET criteria, using the distal internal carotid diameter as the denominator. CONTRAST:  60mL OMNIPAQUE IOHEXOL 350 MG/ML SOLN COMPARISON:  Prior MRI from earlier the same day. FINDINGS: CT HEAD FINDINGS Brain: Evolving right PICA territory infarct again seen, stable from previous MRI. No associated hemorrhage or significant regional mass effect. Remainder the brain is normal in appearance. No other acute large vessel territory infarct or hemorrhage. No mass lesion, midline shift or significant mass effect. No extra-axial fluid collection. No hydrocephalus. Vascular: No hyperdense vessel. Skull: Scalp soft tissues and calvarium within normal limits. Sinuses: Paranasal sinuses are largely clear.  No mastoid effusion. Orbits: Globes orbital soft  tissues within normal limits. Review of the MIP images confirms the above findings CTA NECK FINDINGS Aortic arch: Visualized aortic arch of normal caliber with normal branch pattern. No hemodynamically significant stenosis seen about the origin of the great vessels. Visualized subclavian arteries widely patent. Right carotid system: Right common and internal carotid arteries patent without stenosis, dissection or occlusion. Mild eccentric calcified plaque at the right bifurcation without significant stenosis. Left carotid system: Left common and internal carotid arteries widely patent without stenosis, dissection or occlusion. Vertebral arteries: Both vertebral arteries arise from the subclavian arteries. Left vertebral artery widely patent within the neck without stenosis, dissection or occlusion. Right vertebral artery occluded at its origin, and remains occluded within the neck. Skeleton: No visible acute osseous abnormality. No discrete or worrisome osseous lesions. Other neck: No other acute soft tissue abnormality within the neck. No mass or adenopathy.  Upper chest: Visualized upper chest demonstrates no acute finding. Review of the MIP images confirms the above findings CTA HEAD FINDINGS Anterior circulation: Petrous segments widely patent. Minimal atheromatous plaque within the carotid siphons without significant stenosis. A1 segments patent bilaterally. Normal anterior communicating artery complex. Anterior cerebral arteries patent to their distal aspects without stenosis. Normal in stenosis or occlusion. Normal MCA bifurcations. Distal MCA branches well perfused and symmetric. Posterior circulation: Left vertebral artery widely patent to the vertebrobasilar junction. Right vertebral artery occluded as it courses into the cranial vault. Retrograde filling of the distal right V4 segment via collateral flow across the vertebrobasilar junction. Perfusion of the origin/proximal aspect of the right PICA. Left  PICA patent as well. Basilar patent to its distal aspect without stenosis. Superior cerebellar arteries patent bilaterally. Both PCA supplied via the basilar as well as small bilateral posterior communicating arteries. Both PCAs well perfused to their distal aspects. Venous sinuses: Patent allowing for timing the contrast bolus. Anatomic variants: None significant. Review of the MIP images confirms the above findings IMPRESSION: CT HEAD IMPRESSION: 1. Evolving acute right PICA territory infarct, stable from previous MRI. No associated hemorrhage or significant regional mass effect. 2. Otherwise negative head CT. No other acute intracranial abnormality. CTA HEAD AND NECK IMPRESSION: 1. Occlusion of the right vertebral artery at its origin, suspected to be acute in nature given the acute right PICA territory infarct, possibly related to dissection. Distal reconstitution of the right V4 segment via retrograde filling across the vertebrobasilar junction. The proximal right PICA is perfused. 2. Otherwise negative CTA of the head and neck. No other large vessel occlusion, hemodynamically significant stenosis, or other acute vascular abnormality. Electronically Signed   By: Jeannine Boga M.D.   On: 05/05/2020 21:42   MR Brain Wo Contrast  Addendum Date: 05/05/2020   ADDENDUM REPORT: 05/05/2020 15:47 ADDENDUM: These results were called by telephone at the time of interpretation on 05/05/2020 at 3:47 pm to provider Einar Pheasant , who verbally acknowledged these results. Electronically Signed   By: Franchot Gallo M.D.   On: 05/05/2020 15:47   Result Date: 05/05/2020 CLINICAL DATA:  Headache, dizziness, ataxia. EXAM: MRI HEAD WITHOUT CONTRAST TECHNIQUE: Multiplanar, multiecho pulse sequences of the brain and surrounding structures were obtained without intravenous contrast. COMPARISON:  None. FINDINGS: Brain: Acute infarct in the right PICA territory. Moderately large cerebellar infarct is present without hemorrhage. No  other acute infarct. No evidence of chronic ischemia Ventricle size normal.  Negative for hemorrhage or mass. Vascular: Normal arterial flow voids. Skull and upper cervical spine: Negative Sinuses/Orbits: Paranasal sinuses clear.  Negative orbit Other: None IMPRESSION: Acute infarct right PICA territory. Negative for hemorrhage. Otherwise negative. Recommend further vascular evaluation to rule out vertebral dissection or embolic source. Electronically Signed: By: Franchot Gallo M.D. On: 05/05/2020 15:39     LOS: 0 days   Antonieta Pert, MD [facility]  05/06/2020, 1:49 PM

## 2020-05-06 NOTE — Evaluation (Addendum)
Physical Therapy Evaluation and Discharge Patient Details Name: Marissa Crawford MRN: [id number] DOB: 11/02/1980 Today's Date: 05/06/2020   History of Present Illness  Pt is a 40 y.o. F with significant PMH of tobacco use, HTN who presents with new right sided decreased coordination and ataxia. MRI showing subacute right cerebellar stroke, large in size.  Clinical Impression  Patient evaluated by Physical Therapy with no further acute PT needs identified. Prior to admission, pt works as a [employer] scientist. On PT evaluation, pt denies residual symptoms. Pt displays equal and full strength with no ataxia noted. Pt ambulating x 200 feet with no assistive device independently. Scoring 24/24 on the Dynamic Gait Index, indicating she is not at high risk for falling. BP stable post mobility. All education has been completed and the patient has no further questions. No follow-up Physical Therapy or equipment needs. PT is signing off. Thank you for this referral.     Follow Up Recommendations No PT follow up    Equipment Recommendations  None recommended by PT    Recommendations for Other Services       Precautions / Restrictions Precautions Precautions: None Restrictions Weight Bearing Restrictions: No      Mobility  Bed Mobility Overal bed mobility: Independent                  Transfers Overall transfer level: Independent Equipment used: None                Ambulation/Gait Ambulation/Gait assistance: Independent Gait Distance (Feet): 200 Feet Assistive device: None Gait Pattern/deviations: WFL(Within Functional Limits)        Stairs            Wheelchair Mobility    Modified Rankin (Stroke Patients Only) Modified Rankin (Stroke Patients Only) Pre-Morbid Rankin Score: No symptoms Modified Rankin: No symptoms     Balance Overall balance assessment: No apparent balance deficits (not formally assessed)                               Standardized  Balance Assessment Standardized Balance Assessment : Dynamic Gait Index   Dynamic Gait Index Level Surface: Normal Change in Gait Speed: Normal Gait with Horizontal Head Turns: Normal Gait with Vertical Head Turns: Normal Gait and Pivot Turn: Normal Step Over Obstacle: Normal Step Around Obstacles: Normal Steps: Normal Total Score: 24       Pertinent Vitals/Pain Pain Assessment: No/denies pain    Home Living Family/patient expects to be discharged to:: Private residence Living Arrangements: Children (71 y.o. daughter) Available Help at Discharge: Family                  Prior Function Level of Independence: Independent         Comments: Therapist, sports at [employer]        Extremity/Trunk Assessment   Upper Extremity Assessment Upper Extremity Assessment: Defer to OT evaluation    Lower Extremity Assessment Lower Extremity Assessment: RLE deficits/detail;LLE deficits/detail RLE Deficits / Details: Strength 5/5 LLE Deficits / Details: Strength 5/5    Cervical / Trunk Assessment Cervical / Trunk Assessment: Normal  Communication   Communication: No difficulties  Cognition Arousal/Alertness: Awake/alert Behavior During Therapy: WFL for tasks assessed/performed Overall Cognitive Status: Within Functional Limits for tasks assessed  General Comments      Exercises     Assessment/Plan    PT Assessment Patent does not need any further PT services  PT Problem List         PT Treatment Interventions      PT Goals (Current goals can be found in the Care Plan section)  Acute Rehab PT Goals Patient Stated Goal: go home PT Goal Formulation: All assessment and education complete, DC therapy    Frequency     Barriers to discharge        Co-evaluation               AM-PAC PT "6 Clicks" Mobility  Outcome Measure Help needed turning from your back to your side while  in a flat bed without using bedrails?: None Help needed moving from lying on your back to sitting on the side of a flat bed without using bedrails?: None Help needed moving to and from a bed to a chair (including a wheelchair)?: None Help needed standing up from a chair using your arms (e.g., wheelchair or bedside chair)?: None Help needed to walk in hospital room?: None Help needed climbing 3-5 steps with a railing? : None 6 Click Score: 24    End of Session   Activity Tolerance: Patient tolerated treatment well Patient left: in bed Nurse Communication: Mobility status PT Visit Diagnosis: Other symptoms and signs involving the nervous system (R29.898)    Time: 6073-7106 PT Time Calculation (min) (ACUTE ONLY): 15 min   Charges:   PT Evaluation $PT Eval Low Complexity: [place], PT, DPT Acute Rehabilitation Services Pager [phone] Office [phone]   Deno Etienne 05/06/2020, 10:13 AM

## 2020-05-06 NOTE — ED Notes (Signed)
Tele Breakfast order placed 

## 2020-05-07 ENCOUNTER — Telehealth: Payer: Self-pay

## 2020-05-07 ENCOUNTER — Encounter (HOSPITAL_COMMUNITY): Payer: Self-pay | Admitting: Internal Medicine

## 2020-05-07 DIAGNOSIS — I639 Cerebral infarction, unspecified: Secondary | ICD-10-CM | POA: Insufficient documentation

## 2020-05-07 LAB — RPR: RPR Ser Ql: NONREACTIVE

## 2020-05-07 MED ORDER — ASPIRIN EC 81 MG PO TBEC
81.0000 mg | DELAYED_RELEASE_TABLET | Freq: Every day | ORAL | Status: DC
  Administered 2020-05-08: 81 mg via ORAL
  Filled 2020-05-07: qty 1

## 2020-05-07 MED ORDER — ATORVASTATIN CALCIUM 40 MG PO TABS: 40.0000 mg | ORAL_TABLET | Freq: Every day | ORAL | Status: DC

## 2020-05-07 NOTE — Progress Notes (Signed)
STROKE TEAM PROGRESS NOTE    INTERVAL HISTORY No acute events since arrival.  Patient denies ongoing headaches or neck pain but does report in addition to her headache she experienced 1/24 there was also neck pain radiating proximally into the occiput and distally that caused her to be uncomfortable and disturbed her sleep. She denies any recent fall or traumatic injury but does report she attends a [address]" cross fit exercise class that is quite intense.   We discussed her stroke diagnosis and answered her questions. She was advised to avoid strenuous activity or heavy lifting until she is seen in follow up. She was counseled regarding her stroke risk factors. She is willing to quit smoking.  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    OBJECTIVE Vitals:   05/07/20 0232 05/07/20 0400 05/07/20 0700 05/07/20 0907  BP: 124/78 (!) 130/59  (!) 149/96  Pulse: 79 73 71 89  Resp: 17 17  16   Temp: 98.5 F (36.9 C)   98.8 F (37.1 C)  TempSrc: Oral   Oral  SpO2: 98% 100%  100%  Weight:       CBC:  Recent Labs  Lab 05/04/20 0905 05/05/20 1831  WBC 8.1 8.8  NEUTROABS 4.9 4.9  HGB 12.6 13.3  HCT 37.1 38.8  MCV 97.3 96.3  PLT 332.0 956   Basic Metabolic Panel:  Recent Labs  Lab 05/05/20 1831 05/06/20 1010  NA 137 136  K 3.1* 3.7  CL 98 102  CO2 24 24  GLUCOSE 89 153*  BUN 14 13  CREATININE 1.17* 0.94  CALCIUM 9.6 9.2   Lipid Panel:     Component Value Date/Time   CHOL 188 05/06/2020 0516   TRIG 134 05/06/2020 0516   HDL 22 (L) 05/06/2020 0516   CHOLHDL 8.5 05/06/2020 0516   VLDL 27 05/06/2020 0516   LDLCALC 139 (H) 05/06/2020 0516   HgbA1c:  Lab Results  Component Value Date   HGBA1C 5.9 (H) 05/06/2020   Urine Drug Screen: No results found for: LABOPIA, COCAINSCRNUR, LABBENZ, AMPHETMU, THCU, LABBARB  Alcohol Level No results found for: ETH  IMAGING  CT Angio Head W or Wo Contrast  CT Angio Neck W and/or Wo  Contrast 05/05/2020  CT HEAD  IMPRESSION:  1. Evolving acute right PICA territory infarct, stable from previous MRI. No associated hemorrhage or significant regional mass effect.  2. Otherwise negative head CT. No other acute intracranial abnormality.   CTA HEAD AND NECK  IMPRESSION:  1. Occlusion of the right vertebral artery at its origin, suspected to be acute in nature given the acute right PICA territory infarct, possibly related to dissection. Distal reconstitution of the right V4 segment via retrograde filling across the vertebrobasilar junction. The proximal right PICA is perfused.  2. Otherwise negative CTA of the head and neck. No other large vessel occlusion, hemodynamically significant stenosis, or other acute vascular abnormality.   MR Brain Wo Contrast 05/05/2020   IMPRESSION:  Acute infarct right PICA territory. Negative for hemorrhage. Otherwise negative. Recommend further vascular evaluation to rule out vertebral dissection or embolic source.   Transthoracic Echocardiogram  04/12/2020 IMPRESSIONS  1. Left ventricular ejection fraction, by estimation, is 60 to 65%. The  left ventricle has normal function. The left ventricle has no regional  wall motion abnormalities. Left ventricular diastolic parameters were  normal.  2. Right ventricular systolic function is normal. The right ventricular  size is normal. Tricuspid regurgitation signal is  inadequate for assessing  PA pressure.  3. The mitral valve is normal in structure. No evidence of mitral valve  regurgitation.  4. The aortic valve was not well visualized. Aortic valve regurgitation  is not visualized.  5. The inferior vena cava is normal in size with greater than 50%  respiratory variability, suggesting right atrial pressure of 3 mmHg.  Conclusion(s)/Recommendation(s): Normal biventricular function without  evidence of hemodynamically significant valvular heart disease.   ECG - SR rate 99 BPM. (See  cardiology reading for complete details)  PHYSICAL EXAM Blood pressure (!) 149/96, pulse 89, temperature 98.8 F (37.1 C), temperature source Oral, resp. rate 16, weight 82.4 kg, last menstrual period 04/30/2020, SpO2 100 %, currently breastfeeding. GENERAL: She is doing well at this time.  HEENT: No scalp tenderness noted.  Neck is supple and no trauma noted.  EXTREMITIES: No edema   BACK: Normal  SKIN: Normal by inspection.    MENTAL STATUS: Alert and oriented. Speech, language and cognition are generally intact. Judgment and insight normal.   CRANIAL NERVES: Pupils are equal, round and reactive to light and accomodation; extra ocular movements are full, there is no significant nystagmus; visual fields are full; upper and lower facial muscles are normal in strength and symmetric, there is no flattening of the nasolabial folds; tongue is midline; uvula is midline; shoulder elevation is normal.  MOTOR: Normal tone, bulk and strength; no pronator drift.  There is no drift of the upper or lower extremities.  COORDINATION: Left finger to nose is normal, right finger to nose is normal, No rest tremor; no intention tremor; no postural tremor; no bradykinesia.  SENSATION: Normal to light touch, temperature, and pain   ASSESSMENT/PLAN Ms. Marissa Crawford is a 40 y.o. female with a history of ongoing tobacco use and hypertension who awoke Monday with a right sided HA, dizziness, right sided incoordination, right sided ataxia with right neck and shoulder discomfort. The sxs resolved over several days but on 05/05/20 an outpt MRI revealed a large right cerebellar ischemic infarction. She did not receive IV t-PA due to late presentation (>4.5 hours from time of onset).  Stroke: Acute infarct right PICA territory - embolic - likely due to dissection of right vertebral artery.  Resultant mild ataxia.  Code Stroke CT Head - not ordered  CT head - Evolving acute right PICA territory infarct, stable  from previous MRI. No associated hemorrhage or significant regional mass effect. Otherwise negative head CT. No other acute intracranial abnormality.   MRI head - Acute infarct right PICA territory. Negative for hemorrhage. Otherwise negative. Recommend further vascular evaluation to rule out vertebral dissection or embolic source.  MRA head - not ordered  CTA H&N - Occlusion of the right vertebral artery at its origin, suspected to be acute in nature given the acute right PICA territory infarct, possibly related to dissection. Distal reconstitution of the right V4 segment via retrograde filling across the vertebrobasilar junction. The proximal right PICA is perfused. Otherwise negative CTA of the head and neck. No other large vessel occlusion, hemodynamically significant stenosis, or other acute vascular abnormality.   CT Perfusion - not ordered  Vasculitis labs - pending ; Sed rate 121 (H)  Carotid Doppler - CTA neck ordered - carotid dopplers not indicated.  2D Echo - EF 60 - 65%. No cardiac source of emboli identified.   TEE pending: recommended. Patient wishes to return home  and schedule as outpatient. This request was sent to [facility].   Linus Orn  Corona Virus 2 - negative  LDL - 139  HgbA1c - 5.9  UDS - not ordered  VTE prophylaxis - SCDs Diet  Diet Order            Diet Heart Room service appropriate? Yes; Fluid consistency: Thin  Diet effective now                 No antithrombotic prior to admission, Plan is for  aspirin 81 mg daily and clopidogrel 75 mg daily x 3 months.   Patient counseled to be compliant with her antithrombotic medications.   Ongoing aggressive stroke risk factor management  Therapy recommendations:  No PT or OT follow up is recommended  Disposition:  Home   S/p Dissection precautions: Patient was counseled to avoid strenuous activity or heavy lifting  Neurology follow up appointment requested.   Hypertension  Home BP meds: HCTZ ;  Losartan ; metoprolol  Current BP meds: HCTZ ; Losartan ; metoprolol  Stable . Permissive hypertension (OK if < 220/120) but gradually normalize in 5-7 days  . Long-term BP goal normotensive  Hyperlipidemia  Home Lipid lowering medication: none  LDL 139, goal < 70  Clarified that patient is not currently nursing a child.Lipitor 45m initiated.    Continue statin at discharge  Other Stroke Risk Factors  Cigarette smoker - advised to stop smoking  ETOH use, advised to drink no more than 1 alcoholic beverage per day.  Obesity, recommend weight loss, diet and exercise as appropriate   Family hx stroke (maternal grandfather and paternal grandmother)  Other Active Problems, Findings, Recommendations and/or Plan  Code status - Full code  Hospital day # 1 I have personally obtained history,examined this patient, reviewed notes, independently viewed imaging studies, participated in medical decision making and plan of care.ROS completed by me personally and pertinent positives fully documented  I have made any additions or clarifications directly to the above note. Agree with note above.  Patient presented with 1 week history of ataxia and leaning to the right hand MRI scan shows a right posterior inferior cerebellar artery infarct with CT angio showing right vertebral artery occlusion near its origin most likely from underlying dissection.  Patient did complain of some neck pain at the onset of the symptoms.  She has no history to suggest an ongoing infection, vasculitis or autoimmune condition but ESR is elevated 121.  Recommend checking vasculitic labs and TEE.  Patient advised not to do heavy physical activities which involve neck straining.  Aspirin and Plavix for 3 months followed by aspirin alone and aggressive risk factor modification.  Greater than 50% time during this 35-minute visit was spent on counseling and coordination of care about her stroke discussion about stroke prevention  and treatment.  PAntony Contras MD Medical Director MGreen LakePager: [phone]/31/2022 4:08 PM  To contact Stroke Continuity provider, please refer to [email or web address] After hours, contact General Neurology

## 2020-05-07 NOTE — ED Notes (Signed)
Patient requesting to stay in ED until the morning so that she can see cardiology and be set up with outpatient follow. Dr. Cyd Silence aware and agreeable to patient staying in ed until then and will pass information to morning team. Charge nurse made aware. Patient placement called.

## 2020-05-07 NOTE — ED Notes (Signed)
Upon attempt to transport patient to inpatient unit, patient requesting to leave and follow up outpatient. Dr. Cyd Silence paged.

## 2020-05-07 NOTE — ED Notes (Signed)
Dr. Cyd Silence speaking with patient.

## 2020-05-07 NOTE — Telephone Encounter (Signed)
Who Is Calling Lab Lab Name [facility] Phone Number [phone] Lab Tech Name [place] [facility] Reference Number NA Chief Complaint Lab Result (Critical or Stat) Call Type Lab Send to RN Reason for Call Report lab results Initial Comment Radiology needs to speak to on-call about an MRI brain critical report Translation No Nurse Assessment Nurse: Radford Pax, RN, Eugene Garnet Date/Time (Eastern Time): 05/05/2020 3:40:20 PM Confirm and document reason for call. If symptomatic, describe symptoms. ---Erline Levine with radiology is needing the on call paged. Radiologist Dr Carlis Abbott is needing to speak with the on call urgently about MRI brain scans on patient. Does the patient have any new or worsening symptoms? ---No Please document clinical information provided and list any resource used. ---Will page on call provider. Nurse: Radford Pax, RN, Eugene Garnet Date/Time Eilene Ghazi Time): 05/05/2020 3:37:29 PM Is there an on-call provider listed? ---Yes Please list name of person reporting value (Lab Employee) and a contact number. ---Erline Levine Please document the following items: Lab name Lab value (read back to lab to verify) Reference range for lab value Date and time blood was drawn Collect time of birth for bilirubin results ---Urgent MRI scans Disp. Time Eilene Ghazi Time) Disposition Final User 05/05/2020 3:41:25 PM Called On-Call Provider Radford Pax, RN, Eugene Garnet 05/05/2020 3:42:25 PM Clinical Call Yes Radford Pax, RN, Eugene Garnet PLEASE NOTE: All timestamps contained within this report are represented as [place] Standard Time. CONFIDENTIALTY NOTICE: This fax transmission is intended only for the addressee. It contains information that is legally privileged, confidential or otherwise protected from use or disclosure. If you are not the intended recipient, you are strictly prohibited from reviewing, disclosing, copying using or disseminating any of this information or taking any action in reliance on or regarding this information. If you  have received this fax in error, please notify us immediately by telephone so that we can arrange for its return to [place]. Phone: [phone], Toll-Free: [phone], Fax: [phone] Page: 2 of 2 Call Id: [id number] Paging DoctorName Phone DateTime Result/Outcome Message Type Notes Waunita Schooner- MD [phone] 05/05/2020 3:41:25 PM Called On Call Provider - Reached Doctor Paged Waunita Schooner- MD 05/05/2020 3:41:50 PM Spoke with On Call - General Message Result MD has been connected directly to radiology tech  Pt currently admitted

## 2020-05-07 NOTE — ED Notes (Signed)
Multiple attempts made to call report to inpatient unit without success.

## 2020-05-07 NOTE — ED Notes (Signed)
Breakfast ordered 

## 2020-05-07 NOTE — Progress Notes (Signed)
PROGRESS NOTE    Marissa Crawford  [id number] DOB: Mar 15, 1981 DOA: 05/05/2020 PCP: Ann Held, DO   Chief Complaint  Patient presents with  . Cerebrovascular Accident  Brief Narrative: 40 year old female with history of hypertension, obesity, tobacco use presented with right-sided decreased coordination over the past week and outpatient MRI showing CVA. As per the report Patient awoke on Monday 04/30/20 with a headache, right neck and shoulder pain.  She also noted that she was unsteady on her feet and had some associated dizziness on standing.  She also noted some heaviness of her right leg at that time.She presented to the ED per pcp advise for further evaluation however patient felt like she can work through it and went to work.  At work she was working slower and this lasted for a couple of days and got better on Wednesday. Had televisit with her PCP on Tuesday and at that time PCP ordered an MRI. MRI  done on 1/29 demonstrated an acute right PICA territory infarct without hemorrhage. Of note, patient works here at [employer] [place] In ED-CTA performed in ED showed occlusion of the right vertebral artery suspected to be acute and possibly due to dissection.  Neurology was consulted in the ED. Patient was admitted for further monitoring and work-up  Subjective:  Seen this morning.  She has been ambulating no new complaints.  No headache. Has been wanting to go home TEE scheduled for 2/1 and schedule is full. Discussed extensively for further need to complete TEE, also notified neurology and cardiology to come and talk to her.   Assessment & Plan:  Subacute right cerebellar PICA stroke/bleed occlusion of the right vertebral artery at its origin possibly related to dissection: stroke large in size, no hydrocephalus on MRI brain.neurology on board, continue stroke work-up smoking cessation BP management, neurochecks monitoring,  telemetry monitoring, vasculitis work-up in process-given  elevated sed rate 121, elevated CRP 5.6.-ANA with reflex if positive, antiphospholipid antibody, homocystine level.  RPR negative, HIV antibody pending No PT OT needs. HbA1c 5.9-prediabetic range, lipid panel ldl 139 , goal < 70. TTE 04/12/20- lvef 60-65%.Neurology following and planning for TEE,schedule is full today and is scheduled for Tuesday per cards.continue wound ASA 325 mg and plavix 75 mg. No statins 2/2 lactation (defer to neuro, will need OP f/u).  Discussed with her for the need to complete work-up here/not to leave  AMA, cardiology going to discuss with her to see if outpatient TEE is feasible if not then TEE is already scheduled for tomorrow.  HTN: Well-controlled on her home losartan HCTZ and metoprolol.  Prediabetes, new onset with hemoglobin A1c 5.9 discussed about carbohydrate restriction, weight loss.  Follow-up A1c next 3 to 6 months.  Follow-up with PCP  Hypokalemia: Repleted and normalized. AKI improved 1.5> 0.9 encourage oral hydration, tolerating home losartan/hctz. Morbid Obesity w/ BMI 34: Educated on wt loss,exercise. OP pcp f/u will be beneficial. Tobacco use - will benefit w/ cessation  Nutrition: Diet Order            Diet Heart Room service appropriate? Yes; Fluid consistency: Thin  Diet effective now                 Pt's Body mass index is 34.33 kg/m.  DVT prophylaxis: SCD's Start: 05/05/20 2258 Code Status:   Code Status: Full Code  Family Communication: plan of care discussed with patient at bedside.  Status is: inpatient Patient remains inpatient for ongoing need to complete a stroke work-up  with TEE.   Dispo: The patient is from: Home              Anticipated d/c is to: Home              Anticipated d/c date is: tomorrow after TEE, and once cleared by neurology.              Patient currently is not medically stable to d/c.   Difficult to place patient No  Consultants:see note  Procedures:see note  Culture/Microbiology No results found for:  SDES, SPECREQUEST, CULT, REPTSTATUS  Other culture-see note  Medications: Scheduled Meds: . aspirin EC  325 mg Oral Daily  . clopidogrel  75 mg Oral Q breakfast  . hydrochlorothiazide  25 mg Oral Daily  . losartan  100 mg Oral Daily  . metoprolol succinate  100 mg Oral Daily   Continuous Infusions:  Antimicrobials: Anti-infectives (From admission, onward)   None     Objective: Vitals: Today's Vitals   05/07/20 0400 05/07/20 0700 05/07/20 0907 05/07/20 1115  BP: (!) 130/59  (!) 149/96 129/85  Pulse: 73 71 89 81  Resp: 17  16 16   Temp:   98.8 F (37.1 C)   TempSrc:   Oral   SpO2: 100%  100% 99%  Weight:      PainSc:   4  2    No intake or output data in the 24 hours ending 05/07/20 1349 Filed Weights   05/06/20 0939  Weight: 82.4 kg   Weight change:   Intake/Output from previous day: No intake/output data recorded. Intake/Output this shift: No intake/output data recorded. Filed Weights   05/06/20 0939  Weight: 82.4 kg    Examination: General exam: AAOx3 , NAD, weak appearing.Pleasant. HEENT:Oral mucosa moist, Ear/Nose WNL grossly, dentition normal. Respiratory system: bilaterally clear,no wheezing or crackles,no use of accessory muscle Cardiovascular system: S1 & S2 +, No JVD,. Gastrointestinal system: Abdomen soft, NT,ND, BS+ Nervous System:Alert, awake, moving extremities and grossly nonfocal Extremities: No edema, distal peripheral pulses palpable.  Skin: No rashes,no icterus. MSK: Normal muscle bulk,tone, power  Data Reviewed: I have personally reviewed following labs and imaging studies CBC: Recent Labs  Lab 05/04/20 0905 05/05/20 1831  WBC 8.1 8.8  NEUTROABS 4.9 4.9  HGB 12.6 13.3  HCT 37.1 38.8  MCV 97.3 96.3  PLT 332.0 542   Basic Metabolic Panel: Recent Labs  Lab 05/04/20 0905 05/05/20 1831 05/06/20 1010  NA 136 137 136  K 3.4* 3.1* 3.7  CL 99 98 102  CO2 28 24 24   GLUCOSE 103* 89 153*  BUN 20 14 13   CREATININE 1.57* 1.17* 0.94   CALCIUM 9.6 9.6 9.2   GFR: Estimated Creatinine Clearance: 78.9 mL/min (by C-G formula based on SCr of 0.94 mg/dL). Liver Function Tests: Recent Labs  Lab 05/04/20 0905 05/05/20 1831  AST 38* 42*  ALT 37* 38  ALKPHOS 70 67  BILITOT 0.5 0.4  PROT 7.8 8.2*  ALBUMIN 4.3 4.0   No results for input(s): LIPASE, AMYLASE in the last 168 hours. No results for input(s): AMMONIA in the last 168 hours. Coagulation Profile: Recent Labs  Lab 05/05/20 1831  INR 1.0   Cardiac Enzymes: No results for input(s): CKTOTAL, CKMB, CKMBINDEX, TROPONINI in the last 168 hours. BNP (last 3 results) No results for input(s): PROBNP in the last 8760 hours. HbA1C: Recent Labs    05/06/20 0516  HGBA1C 5.9*   CBG: No results for input(s): GLUCAP in the last 168 hours.  Lipid Profile: Recent Labs    05/06/20 0516  CHOL 188  HDL 22*  LDLCALC 139*  TRIG 134  CHOLHDL 8.5   Thyroid Function Tests: No results for input(s): TSH, T4TOTAL, FREET4, T3FREE, THYROIDAB in the last 72 hours. Anemia Panel: No results for input(s): VITAMINB12, FOLATE, FERRITIN, TIBC, IRON, RETICCTPCT in the last 72 hours. Sepsis Labs: No results for input(s): PROCALCITON, LATICACIDVEN in the last 168 hours.  Recent Results (from the past 240 hour(s))  SARS CORONAVIRUS 2 (TAT 6-24 HRS) Nasopharyngeal Nasopharyngeal Swab     Status: None   Collection Time: 05/05/20 11:21 PM   Specimen: Nasopharyngeal Swab  Result Value Ref Range Status   SARS Coronavirus 2 NEGATIVE NEGATIVE Final    Comment: (NOTE) SARS-CoV-2 target nucleic acids are NOT DETECTED.  The SARS-CoV-2 RNA is generally detectable in upper and lower respiratory specimens during the acute phase of infection. Negative results do not preclude SARS-CoV-2 infection, do not rule out co-infections with other pathogens, and should not be used as the sole basis for treatment or other patient management decisions. Negative results must be combined with clinical  observations, patient history, and epidemiological information. The expected result is Negative.  Fact Sheet for Patients: SugarRoll.be  Fact Sheet for Healthcare Providers: https://www.woods-mathews.com/  This test is not yet approved or cleared by the [place] FDA and  has been authorized for detection and/or diagnosis of SARS-CoV-2 by FDA under an Emergency Use Authorization (EUA). This EUA will remain  in effect (meaning this test can be used) for the duration of the COVID-19 declaration under Se ction 564(b)(1) of the Act, 21 U.S.C. section 360bbb-3(b)(1), unless the authorization is terminated or revoked sooner.  Performed at [facility], [place] [address] [place], [place] [address]      Radiology Studies: CT Angio Head W or Wo Contrast  Result Date: 05/05/2020 CLINICAL DATA:  Initial evaluation for acute stroke. EXAM: CT ANGIOGRAPHY HEAD AND NECK TECHNIQUE: Multidetector CT imaging of the head and neck was performed using the standard protocol during bolus administration of intravenous contrast. Multiplanar CT image reconstructions and MIPs were obtained to evaluate the vascular anatomy. Carotid stenosis measurements (when applicable) are obtained utilizing NASCET criteria, using the distal internal carotid diameter as the denominator. CONTRAST:  19mL OMNIPAQUE IOHEXOL 350 MG/ML SOLN COMPARISON:  Prior MRI from earlier the same day. FINDINGS: CT HEAD FINDINGS Brain: Evolving right PICA territory infarct again seen, stable from previous MRI. No associated hemorrhage or significant regional mass effect. Remainder the brain is normal in appearance. No other acute large vessel territory infarct or hemorrhage. No mass lesion, midline shift or significant mass effect. No extra-axial fluid collection. No hydrocephalus. Vascular: No hyperdense vessel. Skull: Scalp soft tissues and calvarium within normal limits. Sinuses: Paranasal sinuses are  largely clear.  No mastoid effusion. Orbits: Globes orbital soft tissues within normal limits. Review of the MIP images confirms the above findings CTA NECK FINDINGS Aortic arch: Visualized aortic arch of normal caliber with normal branch pattern. No hemodynamically significant stenosis seen about the origin of the great vessels. Visualized subclavian arteries widely patent. Right carotid system: Right common and internal carotid arteries patent without stenosis, dissection or occlusion. Mild eccentric calcified plaque at the right bifurcation without significant stenosis. Left carotid system: Left common and internal carotid arteries widely patent without stenosis, dissection or occlusion. Vertebral arteries: Both vertebral arteries arise from the subclavian arteries. Left vertebral artery widely patent within the neck without stenosis, dissection or occlusion. Right vertebral  artery occluded at its origin, and remains occluded within the neck. Skeleton: No visible acute osseous abnormality. No discrete or worrisome osseous lesions. Other neck: No other acute soft tissue abnormality within the neck. No mass or adenopathy. Upper chest: Visualized upper chest demonstrates no acute finding. Review of the MIP images confirms the above findings CTA HEAD FINDINGS Anterior circulation: Petrous segments widely patent. Minimal atheromatous plaque within the carotid siphons without significant stenosis. A1 segments patent bilaterally. Normal anterior communicating artery complex. Anterior cerebral arteries patent to their distal aspects without stenosis. Normal in stenosis or occlusion. Normal MCA bifurcations. Distal MCA branches well perfused and symmetric. Posterior circulation: Left vertebral artery widely patent to the vertebrobasilar junction. Right vertebral artery occluded as it courses into the cranial vault. Retrograde filling of the distal right V4 segment via collateral flow across the vertebrobasilar junction.  Perfusion of the origin/proximal aspect of the right PICA. Left PICA patent as well. Basilar patent to its distal aspect without stenosis. Superior cerebellar arteries patent bilaterally. Both PCA supplied via the basilar as well as small bilateral posterior communicating arteries. Both PCAs well perfused to their distal aspects. Venous sinuses: Patent allowing for timing the contrast bolus. Anatomic variants: None significant. Review of the MIP images confirms the above findings IMPRESSION: CT HEAD IMPRESSION: 1. Evolving acute right PICA territory infarct, stable from previous MRI. No associated hemorrhage or significant regional mass effect. 2. Otherwise negative head CT. No other acute intracranial abnormality. CTA HEAD AND NECK IMPRESSION: 1. Occlusion of the right vertebral artery at its origin, suspected to be acute in nature given the acute right PICA territory infarct, possibly related to dissection. Distal reconstitution of the right V4 segment via retrograde filling across the vertebrobasilar junction. The proximal right PICA is perfused. 2. Otherwise negative CTA of the head and neck. No other large vessel occlusion, hemodynamically significant stenosis, or other acute vascular abnormality. Electronically Signed   By: Jeannine Boga M.D.   On: 05/05/2020 21:42   CT Angio Neck W and/or Wo Contrast  Result Date: 05/05/2020 CLINICAL DATA:  Initial evaluation for acute stroke. EXAM: CT ANGIOGRAPHY HEAD AND NECK TECHNIQUE: Multidetector CT imaging of the head and neck was performed using the standard protocol during bolus administration of intravenous contrast. Multiplanar CT image reconstructions and MIPs were obtained to evaluate the vascular anatomy. Carotid stenosis measurements (when applicable) are obtained utilizing NASCET criteria, using the distal internal carotid diameter as the denominator. CONTRAST:  3mL OMNIPAQUE IOHEXOL 350 MG/ML SOLN COMPARISON:  Prior MRI from earlier the same day.  FINDINGS: CT HEAD FINDINGS Brain: Evolving right PICA territory infarct again seen, stable from previous MRI. No associated hemorrhage or significant regional mass effect. Remainder the brain is normal in appearance. No other acute large vessel territory infarct or hemorrhage. No mass lesion, midline shift or significant mass effect. No extra-axial fluid collection. No hydrocephalus. Vascular: No hyperdense vessel. Skull: Scalp soft tissues and calvarium within normal limits. Sinuses: Paranasal sinuses are largely clear.  No mastoid effusion. Orbits: Globes orbital soft tissues within normal limits. Review of the MIP images confirms the above findings CTA NECK FINDINGS Aortic arch: Visualized aortic arch of normal caliber with normal branch pattern. No hemodynamically significant stenosis seen about the origin of the great vessels. Visualized subclavian arteries widely patent. Right carotid system: Right common and internal carotid arteries patent without stenosis, dissection or occlusion. Mild eccentric calcified plaque at the right bifurcation without significant stenosis. Left carotid system: Left common and internal carotid arteries widely  patent without stenosis, dissection or occlusion. Vertebral arteries: Both vertebral arteries arise from the subclavian arteries. Left vertebral artery widely patent within the neck without stenosis, dissection or occlusion. Right vertebral artery occluded at its origin, and remains occluded within the neck. Skeleton: No visible acute osseous abnormality. No discrete or worrisome osseous lesions. Other neck: No other acute soft tissue abnormality within the neck. No mass or adenopathy. Upper chest: Visualized upper chest demonstrates no acute finding. Review of the MIP images confirms the above findings CTA HEAD FINDINGS Anterior circulation: Petrous segments widely patent. Minimal atheromatous plaque within the carotid siphons without significant stenosis. A1 segments patent  bilaterally. Normal anterior communicating artery complex. Anterior cerebral arteries patent to their distal aspects without stenosis. Normal in stenosis or occlusion. Normal MCA bifurcations. Distal MCA branches well perfused and symmetric. Posterior circulation: Left vertebral artery widely patent to the vertebrobasilar junction. Right vertebral artery occluded as it courses into the cranial vault. Retrograde filling of the distal right V4 segment via collateral flow across the vertebrobasilar junction. Perfusion of the origin/proximal aspect of the right PICA. Left PICA patent as well. Basilar patent to its distal aspect without stenosis. Superior cerebellar arteries patent bilaterally. Both PCA supplied via the basilar as well as small bilateral posterior communicating arteries. Both PCAs well perfused to their distal aspects. Venous sinuses: Patent allowing for timing the contrast bolus. Anatomic variants: None significant. Review of the MIP images confirms the above findings IMPRESSION: CT HEAD IMPRESSION: 1. Evolving acute right PICA territory infarct, stable from previous MRI. No associated hemorrhage or significant regional mass effect. 2. Otherwise negative head CT. No other acute intracranial abnormality. CTA HEAD AND NECK IMPRESSION: 1. Occlusion of the right vertebral artery at its origin, suspected to be acute in nature given the acute right PICA territory infarct, possibly related to dissection. Distal reconstitution of the right V4 segment via retrograde filling across the vertebrobasilar junction. The proximal right PICA is perfused. 2. Otherwise negative CTA of the head and neck. No other large vessel occlusion, hemodynamically significant stenosis, or other acute vascular abnormality. Electronically Signed   By: Jeannine Boga M.D.   On: 05/05/2020 21:42   MR Brain Wo Contrast  Addendum Date: 05/05/2020   ADDENDUM REPORT: 05/05/2020 15:47 ADDENDUM: These results were called by telephone  at the time of interpretation on 05/05/2020 at 3:47 pm to provider Einar Pheasant , who verbally acknowledged these results. Electronically Signed   By: Franchot Gallo M.D.   On: 05/05/2020 15:47   Result Date: 05/05/2020 CLINICAL DATA:  Headache, dizziness, ataxia. EXAM: MRI HEAD WITHOUT CONTRAST TECHNIQUE: Multiplanar, multiecho pulse sequences of the brain and surrounding structures were obtained without intravenous contrast. COMPARISON:  None. FINDINGS: Brain: Acute infarct in the right PICA territory. Moderately large cerebellar infarct is present without hemorrhage. No other acute infarct. No evidence of chronic ischemia Ventricle size normal.  Negative for hemorrhage or mass. Vascular: Normal arterial flow voids. Skull and upper cervical spine: Negative Sinuses/Orbits: Paranasal sinuses clear.  Negative orbit Other: None IMPRESSION: Acute infarct right PICA territory. Negative for hemorrhage. Otherwise negative. Recommend further vascular evaluation to rule out vertebral dissection or embolic source. Electronically Signed: By: Franchot Gallo M.D. On: 05/05/2020 15:39     LOS: 1 day   Antonieta Pert, MD [facility]  05/07/2020, 1:49 PM

## 2020-05-07 NOTE — Progress Notes (Signed)
    [facility] has been requested to perform a transesophageal echocardiogram on 05/08/20 for CVA.  After careful review of history and examination, the risks and benefits of transesophageal echocardiogram have been explained including risks of esophageal damage, perforation (1:10,000 risk), bleeding, pharyngeal hematoma as well as other potential complications associated with conscious sedation including aspiration, arrhythmia, respiratory failure and death. Alternatives to treatment were discussed, questions were answered. Patient is willing to proceed.   TEE scheduled for 05/08/20 at 1:30pm with Dr. Harrell Gave.   Roby Lofts, PA-C 05/07/2020 1:34 PM

## 2020-05-07 NOTE — ED Notes (Signed)
Report given to inpatient RN. Patient transported to floor.

## 2020-05-08 ENCOUNTER — Inpatient Hospital Stay (HOSPITAL_COMMUNITY): Payer: 59 | Admitting: Anesthesiology

## 2020-05-08 ENCOUNTER — Encounter (HOSPITAL_COMMUNITY)
Admission: EM | Disposition: A | Discharge status: Home / Self Care | Payer: Self-pay | Source: Home / Self Care | Attending: Internal Medicine

## 2020-05-08 ENCOUNTER — Other Ambulatory Visit (HOSPITAL_COMMUNITY): Payer: Self-pay | Admitting: Internal Medicine

## 2020-05-08 ENCOUNTER — Inpatient Hospital Stay (HOSPITAL_COMMUNITY): Payer: 59

## 2020-05-08 ENCOUNTER — Encounter (HOSPITAL_COMMUNITY): Payer: Self-pay | Admitting: Internal Medicine

## 2020-05-08 DIAGNOSIS — I10 Essential (primary) hypertension: Secondary | ICD-10-CM | POA: Diagnosis not present

## 2020-05-08 DIAGNOSIS — I639 Cerebral infarction, unspecified: Secondary | ICD-10-CM

## 2020-05-08 HISTORY — PX: BUBBLE STUDY: SHX6837

## 2020-05-08 HISTORY — PX: TEE WITHOUT CARDIOVERSION: SHX5443

## 2020-05-08 LAB — HOMOCYSTEINE: Homocysteine: 14.8 umol/L — ABNORMAL HIGH (ref 0.0–14.5)

## 2020-05-08 SURGERY — ECHOCARDIOGRAM, TRANSESOPHAGEAL
Anesthesia: Monitor Anesthesia Care

## 2020-05-08 MED ORDER — ASPIRIN 81 MG PO TBEC: 81.0000 mg | DELAYED_RELEASE_TABLET | Freq: Every day | ORAL | 11 refills | Status: DC

## 2020-05-08 MED ORDER — PROPOFOL 10 MG/ML IV BOLUS
INTRAVENOUS | Status: DC | PRN
  Administered 2020-05-08 (×2): 30 mg via INTRAVENOUS
  Administered 2020-05-08 (×2): 20 mg via INTRAVENOUS
  Administered 2020-05-08: 50 mg via INTRAVENOUS

## 2020-05-08 MED ORDER — LIDOCAINE HCL (CARDIAC) PF 100 MG/5ML IV SOSY
PREFILLED_SYRINGE | INTRAVENOUS | Status: DC | PRN
  Administered 2020-05-08: 60 mg via INTRATRACHEAL

## 2020-05-08 MED ORDER — CLOPIDOGREL BISULFATE 75 MG PO TABS: 75.0000 mg | ORAL_TABLET | Freq: Every day | ORAL | 2 refills | Status: DC

## 2020-05-08 MED ORDER — SODIUM CHLORIDE 0.9 % IV SOLN: INTRAVENOUS | Status: DC | PRN

## 2020-05-08 MED ORDER — PROPOFOL 500 MG/50ML IV EMUL
INTRAVENOUS | Status: DC | PRN
  Administered 2020-05-08: 125 ug/kg/min via INTRAVENOUS

## 2020-05-08 MED ORDER — ATORVASTATIN CALCIUM 40 MG PO TABS: 40.0000 mg | ORAL_TABLET | Freq: Every day | ORAL | 1 refills | Status: DC

## 2020-05-08 MED FILL — CLOPIDOGREL 75 MG TABLET: 75 | 30 days supply | Qty: 30 | Fill #0

## 2020-05-08 MED FILL — ATORVASTATIN 40 MG TABLET: 40 | 30 days supply | Qty: 30 | Fill #0

## 2020-05-08 MED FILL — ASPIRIN LOW DOSE 81 MG TBEC: 81 | 30 days supply | Qty: 30 | Fill #0

## 2020-05-08 NOTE — Anesthesia Preprocedure Evaluation (Addendum)
Anesthesia Evaluation  Patient identified by MRN, date of birth, ID band Patient awake    Reviewed: Allergy & Precautions, NPO status , Patient's Chart, lab work & pertinent test results, reviewed documented beta blocker date and time   History of Anesthesia Complications Negative for: history of anesthetic complications  Airway Mallampati: II  TM Distance: >3 FB Neck ROM: Full    Dental no notable dental hx.    Pulmonary Current Smoker and Patient abstained from smoking.,    Pulmonary exam normal        Cardiovascular hypertension, Pt. on medications and Pt. on home beta blockers Normal cardiovascular exam     Neuro/Psych CVA (05/05/20) negative psych ROS   GI/Hepatic negative GI ROS, Neg liver ROS,   Endo/Other  negative endocrine ROS  Renal/GU ARFRenal disease  negative genitourinary   Musculoskeletal negative musculoskeletal ROS (+)   Abdominal   Peds  Hematology negative hematology ROS (+)   Anesthesia Other Findings Day of surgery medications reviewed with patient.  Reproductive/Obstetrics negative OB ROS                            Anesthesia Physical Anesthesia Plan  ASA: IV  Anesthesia Plan: MAC   Post-op Pain Management:    Induction:   PONV Risk Score and Plan: 1 and Treatment may vary due to age or medical condition and Propofol infusion  Airway Management Planned: Natural Airway and Nasal Cannula  Additional Equipment: None  Intra-op Plan:   Post-operative Plan:   Informed Consent: I have reviewed the patients History and Physical, chart, labs and discussed the procedure including the risks, benefits and alternatives for the proposed anesthesia with the patient or authorized representative who has indicated his/her understanding and acceptance.       Plan Discussed with: CRNA  Anesthesia Plan Comments:        Anesthesia Quick Evaluation

## 2020-05-08 NOTE — Anesthesia Procedure Notes (Signed)
Procedure Name: MAC Date/Time: 05/08/2020 1:44 PM Performed by: Kathryne Hitch, CRNA Pre-anesthesia Checklist: Patient identified, Emergency Drugs available, Suction available and Patient being monitored Patient Re-evaluated:Patient Re-evaluated prior to induction Oxygen Delivery Method: Nasal cannula Preoxygenation: Pre-oxygenation with 100% oxygen Induction Type: IV induction Placement Confirmation: positive ETCO2 Dental Injury: Teeth and Oropharynx as per pre-operative assessment

## 2020-05-08 NOTE — Discharge Instructions (Signed)
Transesophageal Echocardiogram Transesophageal echocardiogram (TEE) is a test that uses sound waves to take pictures of your heart. TEE is done by passing a small probe attached to a flexible tube down the part of the body that moves food from your mouth to your stomach (esophagus). The pictures give clear images of your heart. This can help your doctor see if there are problems with your heart. Tell a doctor about:  Any allergies you have.  All medicines you are taking. This includes vitamins, herbs, eye drops, creams, and over-the-counter medicines.  Any problems you or family members have had with anesthetic medicines.  Any blood disorders you have.  Any surgeries you have had.  Any medical conditions you have.  Any swallowing problems.  Whether you have or have had a blockage in the part of the body that moves food from your mouth to your stomach.  Whether you are pregnant or may be pregnant. What are the risks? In general, this is a safe procedure. But, problems may occur, such as:  Damage to nearby structures or organs.  A tear in the part of the body that moves food from your mouth to your stomach.  Irregular heartbeat.  Hoarse voice or trouble swallowing.  Bleeding. What happens before the procedure? Medicines  Ask your doctor about changing or stopping: ? Your normal medicines. ? Vitamins, herbs, and supplements. ? Over-the-counter medicines.  Do not take aspirin or ibuprofen unless you are told to. General instructions  Follow instructions from your doctor about what you cannot eat or drink.  You will take out any dentures or dental retainers.  Plan to have a responsible adult take you home from the hospital or clinic.  Plan to have a responsible adult care for you for the time you are told after you leave the hospital or clinic. This is important. What happens during the procedure?  An IV will be put into one of your veins.  You may be given: ? A  sedative. This medicine helps you relax. ? A medicine to numb the back of your throat. This may be sprayed or gargled.  Your blood pressure, heart rate, and breathing will be watched.  You may be asked to lie on your left side.  A bite block will be placed in your mouth. This keeps you from biting the tube.  The tip of the probe will be placed into the back of your mouth.  You will be asked to swallow.  Your doctor will take pictures of your heart.  The probe and bite block will be taken out after the test is done. The procedure may vary among doctors and hospitals.   What can I expect after the procedure?  You will be monitored until you leave the hospital or clinic. This includes checking your blood pressure, heart rate, breathing rate, and blood oxygen level.  Your throat may feel sore and numb. This will get better over time. You will not be allowed to eat or drink until the numbness has gone away.  It is common to have a sore throat for a day or two.  It is up to you to get the results of your procedure. Ask how to get your results when they are ready. Follow these instructions at home:  If you were given a sedative during your procedure, do not drive or use machines until your doctor says that it is safe.  Return to your normal activities when your doctor says that it is safe.    Keep all follow-up visits. Summary  TEE is a test that uses sound waves to take pictures of your heart.  You will be given a medicine to help you relax.  Do not drive or use machines until your doctor says that it is safe. This information is not intended to replace advice given to you by your health care provider. Make sure you discuss any questions you have with your health care provider. Document Revised: 11/15/2019 Document Reviewed: 11/15/2019 [employer] Patient Education  2021 [employer]  

## 2020-05-08 NOTE — Discharge Summary (Signed)
Physician Discharge Summary  Marissa Crawford [id number] DOB: 1980/06/18 DOA: 05/05/2020  PCP: Ann Held, DO  Admit date: 05/05/2020 Discharge date: 05/09/2020  Admitted From: home Disposition:  home  Recommendations for Outpatient Follow-up:  Follow up with PCP in 1-2 weeks Please obtain BMP/CBC in one week Please follow up on the following pending results:  Home Health:no  Equipment/Devices: none  Discharge Condition: Stable Code Status:   Code Status: Prior Diet recommendation:  Diet Order             Diet Carb Modified                    Brief/Interim Summary: 40 year old female with history of hypertension, obesity, tobacco use presented with right-sided decreased coordination over the past week and outpatient MRI showing CVA. As per the report Patient awoke on Monday 04/30/20 with a headache, right neck and shoulder pain.  She also noted that she was unsteady on her feet and had some associated dizziness on standing.  She also noted some heaviness of her right leg at that time.She presented to the ED per pcp advise for further evaluation however patient felt like she can work through it and went to work.  At work she was working slower and this lasted for a couple of days and got better on Wednesday. Had televisit with her PCP on Tuesday and at that time PCP ordered an MRI. MRI  done on 1/29 demonstrated an acute right PICA territory infarct without hemorrhage. Of note, patient works here at [employer] [place] In ED-CTA performed in ED showed occlusion of the right vertebral artery suspected to be acute and possibly due to dissection.  Neurology was consulted in the ED. 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  Neurologically intact.vasculitis work-up in process-given elevated sed rate 121, elevated CRP 5.6.-ANA with reflex if positive, antiphospholipid antibody, homocystine level.  RPR negative-further follow-up of the results and plan with outpatient neurology clinic. No PT OT needs. HbA1c 5.9-prediabetic range, lipid panel ldl 139 , goal < 70, patient is placed on a statin as she is not currently lactating. TTE 04/12/20- lvef 60-65%.as per neurology underwent TEE that as per Dr Foye Clock pulmonary fistula-clinically not significant please refer to TEE full report for more detail.  I discussed with Dr. Leonie Man from neurology who advised to discharge the patient with 3 months of dual antiplatelet then aspirin alone.   HTN:  Well controlled on her home losartan HCTZ and metoprolol.   Prediabetes, new onset with hemoglobin A1c 5.9 discussed about carbohydrate restriction, weight loss.  Follow-up A1c next 3 to 6 months.  Follow-up with PCP   Hypokalemia:  Resolved AKI improved 1.5> 0.9 resolved. encourage oral hydration, tolerating home losartan/hctz. Morbid Obesity w/ BMI 34:  She will benefit with weight loss, PCP follow-up.   Tobacco use - will benefit w/ cessation.  Counseling done.  Consults: Neurology, cardiology  Subjective: Alert awake oriented, no new complaints.  Awaiting for TEE and then hopefully home Discharge Exam: Vitals:   05/08/20 1430 05/08/20 1504  BP: 102/65 (!) 132/92  Pulse: 82 89  Resp: 16 16  Temp:  98.6 F (37 C)  SpO2: 99% 100%   General: Pt is alert, awake, not  in acute distress Cardiovascular: RRR, S1/S2 +, no rubs, no gallops Respiratory: CTA bilaterally, no wheezing, no rhonchi Abdominal: Soft, NT, ND, bowel sounds + Extremities: no edema, no cyanosis  Discharge Instructions  Discharge Instructions     Ambulatory referral to Neurology   Complete by: As directed    An appointment is requested in approximately: 4 weeks with Dr. Leonie Man    Diet Carb Modified   Complete by: As directed    Discharge instructions   Complete by: As directed    Please follow-up with neurology as outpatient.  Please call call MD or return to ER for similar or worsening recurring problem that brought you to hospital or if any fever,nausea/vomiting,abdominal pain, uncontrolled pain, difficulty with vision, weakness of arm or leg, numbness tingling in fingers, chest pain,  shortness of breath or any other alarming symptoms.  Please follow-up your doctor as instructed in a week time and call the office for appointment.  Please avoid alcohol, smoking, or any other illicit substance and maintain healthy habits including taking your regular medications as prescribed.  You were cared for by a hospitalist during your hospital stay. If you have any questions about your discharge medications or the care you received while you were in the hospital after you are discharged, you can call the unit and ask to speak with the hospitalist on call if the hospitalist that took care of you is not available.  Once you are discharged, your primary care physician will handle any further medical issues. Please note that NO REFILLS for any discharge medications will be authorized once you are discharged, as it is imperative that you return to your primary care physician (or establish a relationship with a primary care physician if you do not have one) for your aftercare needs so that they can reassess your need for medications and monitor your lab values   Increase activity slowly   Complete by: As directed       Allergies as of 05/08/2020   No Known Allergies      Medication List     STOP taking these medications    naproxen 500 MG tablet Commonly known as: NAPROSYN       TAKE these medications    aspirin 81 MG EC tablet Take 1 tablet (81 mg total) by mouth daily. Swallow whole.   atorvastatin 40 MG tablet Commonly known as: LIPITOR Take 1 tablet (40 mg  total) by mouth daily.   clopidogrel 75 MG tablet Commonly known as: PLAVIX Take 1 tablet (75 mg total) by mouth daily with breakfast.   losartan-hydrochlorothiazide 100-25 MG tablet Commonly known as: HYZAAR Take 1 tablet by mouth daily.   metoprolol succinate 100 MG 24 hr tablet Commonly known as: TOPROL-XL Take 1 tablet (100 mg total) by mouth daily. Take with or immediately following a meal. What changed: additional instructions        Follow-up Information     Ann Held, DO Follow up.   Specialty: Family Medicine Contact information: [phone] [place] [address] [phone]         Garvin Fila, MD Follow up in 2 week(s).   Specialties: Neurology, Radiology Contact information: [address] [place] [address] [phone]                No Known Allergies  The results of significant diagnostics from this hospitalization (including imaging, microbiology, ancillary and laboratory) are listed below for reference.  Microbiology: Recent Results (from the past 240 hour(s))  SARS CORONAVIRUS 2 (TAT 6-24 HRS) Nasopharyngeal Nasopharyngeal Swab     Status: None   Collection Time: 05/05/20 11:21 PM   Specimen: Nasopharyngeal Swab  Result Value Ref Range Status   SARS Coronavirus 2 NEGATIVE NEGATIVE Final    Comment: (NOTE) SARS-CoV-2 target nucleic acids are NOT DETECTED.  The SARS-CoV-2 RNA is generally detectable in upper and lower respiratory specimens during the acute phase of infection. Negative results do not preclude SARS-CoV-2 infection, do not rule out co-infections with other pathogens, and should not be used as the sole basis for treatment or other patient management decisions. Negative results must be combined with clinical observations, patient history, and epidemiological information. The expected result is Negative.  Fact Sheet for  Patients: SugarRoll.be  Fact Sheet for Healthcare Providers: https://www.woods-mathews.com/  This test is not yet approved or cleared by the [place] FDA and  has been authorized for detection and/or diagnosis of SARS-CoV-2 by FDA under an Emergency Use Authorization (EUA). This EUA will remain  in effect (meaning this test can be used) for the duration of the COVID-19 declaration under Se ction 564(b)(1) of the Act, 21 U.S.C. section 360bbb-3(b)(1), unless the authorization is terminated or revoked sooner.  Performed at [facility], [place] [address], [place], [place] [address]     Procedures/Studies: CT Angio Head W or Wo Contrast  Result Date: 05/05/2020 CLINICAL DATA:  Initial evaluation for acute stroke. EXAM: CT ANGIOGRAPHY HEAD AND NECK TECHNIQUE: Multidetector CT imaging of the head and neck was performed using the standard protocol during bolus administration of intravenous contrast. Multiplanar CT image reconstructions and MIPs were obtained to evaluate the vascular anatomy. Carotid stenosis measurements (when applicable) are obtained utilizing NASCET criteria, using the distal internal carotid diameter as the denominator. CONTRAST:  62mL OMNIPAQUE IOHEXOL 350 MG/ML SOLN COMPARISON:  Prior MRI from earlier the same day. FINDINGS: CT HEAD FINDINGS Brain: Evolving right PICA territory infarct again seen, stable from previous MRI. No associated hemorrhage or significant regional mass effect. Remainder the brain is normal in appearance. No other acute large vessel territory infarct or hemorrhage. No mass lesion, midline shift or significant mass effect. No extra-axial fluid collection. No hydrocephalus. Vascular: No hyperdense vessel. Skull: Scalp soft tissues and calvarium within normal limits. Sinuses: Paranasal sinuses are largely clear.  No mastoid effusion. Orbits: Globes orbital soft tissues within normal limits. Review of the MIP  images confirms the above findings CTA NECK FINDINGS Aortic arch: Visualized aortic arch of normal caliber with normal branch pattern. No hemodynamically significant stenosis seen about the origin of the great vessels. Visualized subclavian arteries widely patent. Right carotid system: Right common and internal carotid arteries patent without stenosis, dissection or occlusion. Mild eccentric calcified plaque at the right bifurcation without significant stenosis. Left carotid system: Left common and internal carotid arteries widely patent without stenosis, dissection or occlusion. Vertebral arteries: Both vertebral arteries arise from the subclavian arteries. Left vertebral artery widely patent within the neck without stenosis, dissection or occlusion. Right vertebral artery occluded at its origin, and remains occluded within the neck. Skeleton: No visible acute osseous abnormality. No discrete or worrisome osseous lesions. Other neck: No other acute soft tissue abnormality within the neck. No mass or adenopathy. Upper chest: Visualized upper chest demonstrates no acute finding. Review of the MIP images confirms the above findings CTA HEAD FINDINGS Anterior circulation: Petrous segments widely patent. Minimal atheromatous plaque within the carotid siphons without significant stenosis.  A1 segments patent bilaterally. Normal anterior communicating artery complex. Anterior cerebral arteries patent to their distal aspects without stenosis. Normal in stenosis or occlusion. Normal MCA bifurcations. Distal MCA branches well perfused and symmetric. Posterior circulation: Left vertebral artery widely patent to the vertebrobasilar junction. Right vertebral artery occluded as it courses into the cranial vault. Retrograde filling of the distal right V4 segment via collateral flow across the vertebrobasilar junction. Perfusion of the origin/proximal aspect of the right PICA. Left PICA patent as well. Basilar patent to its distal  aspect without stenosis. Superior cerebellar arteries patent bilaterally. Both PCA supplied via the basilar as well as small bilateral posterior communicating arteries. Both PCAs well perfused to their distal aspects. Venous sinuses: Patent allowing for timing the contrast bolus. Anatomic variants: None significant. Review of the MIP images confirms the above findings IMPRESSION: CT HEAD IMPRESSION: 1. Evolving acute right PICA territory infarct, stable from previous MRI. No associated hemorrhage or significant regional mass effect. 2. Otherwise negative head CT. No other acute intracranial abnormality. CTA HEAD AND NECK IMPRESSION: 1. Occlusion of the right vertebral artery at its origin, suspected to be acute in nature given the acute right PICA territory infarct, possibly related to dissection. Distal reconstitution of the right V4 segment via retrograde filling across the vertebrobasilar junction. The proximal right PICA is perfused. 2. Otherwise negative CTA of the head and neck. No other large vessel occlusion, hemodynamically significant stenosis, or other acute vascular abnormality. Electronically Signed   By: Jeannine Boga M.D.   On: 05/05/2020 21:42   CT Angio Neck W and/or Wo Contrast  Result Date: 05/05/2020 CLINICAL DATA:  Initial evaluation for acute stroke. EXAM: CT ANGIOGRAPHY HEAD AND NECK TECHNIQUE: Multidetector CT imaging of the head and neck was performed using the standard protocol during bolus administration of intravenous contrast. Multiplanar CT image reconstructions and MIPs were obtained to evaluate the vascular anatomy. Carotid stenosis measurements (when applicable) are obtained utilizing NASCET criteria, using the distal internal carotid diameter as the denominator. CONTRAST:  62mL OMNIPAQUE IOHEXOL 350 MG/ML SOLN COMPARISON:  Prior MRI from earlier the same day. FINDINGS: CT HEAD FINDINGS Brain: Evolving right PICA territory infarct again seen, stable from previous MRI. No  associated hemorrhage or significant regional mass effect. Remainder the brain is normal in appearance. No other acute large vessel territory infarct or hemorrhage. No mass lesion, midline shift or significant mass effect. No extra-axial fluid collection. No hydrocephalus. Vascular: No hyperdense vessel. Skull: Scalp soft tissues and calvarium within normal limits. Sinuses: Paranasal sinuses are largely clear.  No mastoid effusion. Orbits: Globes orbital soft tissues within normal limits. Review of the MIP images confirms the above findings CTA NECK FINDINGS Aortic arch: Visualized aortic arch of normal caliber with normal branch pattern. No hemodynamically significant stenosis seen about the origin of the great vessels. Visualized subclavian arteries widely patent. Right carotid system: Right common and internal carotid arteries patent without stenosis, dissection or occlusion. Mild eccentric calcified plaque at the right bifurcation without significant stenosis. Left carotid system: Left common and internal carotid arteries widely patent without stenosis, dissection or occlusion. Vertebral arteries: Both vertebral arteries arise from the subclavian arteries. Left vertebral artery widely patent within the neck without stenosis, dissection or occlusion. Right vertebral artery occluded at its origin, and remains occluded within the neck. Skeleton: No visible acute osseous abnormality. No discrete or worrisome osseous lesions. Other neck: No other acute soft tissue abnormality within the neck. No mass or adenopathy. Upper chest: Visualized upper chest demonstrates  no acute finding. Review of the MIP images confirms the above findings CTA HEAD FINDINGS Anterior circulation: Petrous segments widely patent. Minimal atheromatous plaque within the carotid siphons without significant stenosis. A1 segments patent bilaterally. Normal anterior communicating artery complex. Anterior cerebral arteries patent to their distal  aspects without stenosis. Normal in stenosis or occlusion. Normal MCA bifurcations. Distal MCA branches well perfused and symmetric. Posterior circulation: Left vertebral artery widely patent to the vertebrobasilar junction. Right vertebral artery occluded as it courses into the cranial vault. Retrograde filling of the distal right V4 segment via collateral flow across the vertebrobasilar junction. Perfusion of the origin/proximal aspect of the right PICA. Left PICA patent as well. Basilar patent to its distal aspect without stenosis. Superior cerebellar arteries patent bilaterally. Both PCA supplied via the basilar as well as small bilateral posterior communicating arteries. Both PCAs well perfused to their distal aspects. Venous sinuses: Patent allowing for timing the contrast bolus. Anatomic variants: None significant. Review of the MIP images confirms the above findings IMPRESSION: CT HEAD IMPRESSION: 1. Evolving acute right PICA territory infarct, stable from previous MRI. No associated hemorrhage or significant regional mass effect. 2. Otherwise negative head CT. No other acute intracranial abnormality. CTA HEAD AND NECK IMPRESSION: 1. Occlusion of the right vertebral artery at its origin, suspected to be acute in nature given the acute right PICA territory infarct, possibly related to dissection. Distal reconstitution of the right V4 segment via retrograde filling across the vertebrobasilar junction. The proximal right PICA is perfused. 2. Otherwise negative CTA of the head and neck. No other large vessel occlusion, hemodynamically significant stenosis, or other acute vascular abnormality. Electronically Signed   By: Jeannine Boga M.D.   On: 05/05/2020 21:42   MR Brain Wo Contrast  Addendum Date: 05/05/2020   ADDENDUM REPORT: 05/05/2020 15:47 ADDENDUM: These results were called by telephone at the time of interpretation on 05/05/2020 at 3:47 pm to provider Einar Pheasant , who verbally acknowledged these  results. Electronically Signed   By: Franchot Gallo M.D.   On: 05/05/2020 15:47   Result Date: 05/05/2020 CLINICAL DATA:  Headache, dizziness, ataxia. EXAM: MRI HEAD WITHOUT CONTRAST TECHNIQUE: Multiplanar, multiecho pulse sequences of the brain and surrounding structures were obtained without intravenous contrast. COMPARISON:  None. FINDINGS: Brain: Acute infarct in the right PICA territory. Moderately large cerebellar infarct is present without hemorrhage. No other acute infarct. No evidence of chronic ischemia Ventricle size normal.  Negative for hemorrhage or mass. Vascular: Normal arterial flow voids. Skull and upper cervical spine: Negative Sinuses/Orbits: Paranasal sinuses clear.  Negative orbit Other: None IMPRESSION: Acute infarct right PICA territory. Negative for hemorrhage. Otherwise negative. Recommend further vascular evaluation to rule out vertebral dissection or embolic source. Electronically Signed: By: Franchot Gallo M.D. On: 05/05/2020 15:39   ECHOCARDIOGRAM COMPLETE  Result Date: 04/12/2020    ECHOCARDIOGRAM REPORT   Patient Name:   Marissa Crawford Date of Exam: 04/12/2020 Medical Rec #:  [id number]    Height:       61.0 in Accession #:    [id number]   Weight:       186.8 lb Date of Birth:  11-Dec-1980     BSA:          1.835 m Patient Age:    39 years     BP:           147/83 mmHg Patient Gender: F            HR:  90 bpm. Exam Location:  Outpatient Procedure: 2D Echo, Cardiac Doppler and Color Doppler Indications:    Palpitations 785.1 / R00.2                 Abnormal ECG R94.31  History:        Patient has no prior history of Echocardiogram examinations.                 Risk Factors:Hypertension and Current Smoker.  Sonographer:    Vickie Epley RDCS Referring Phys: [place]  1. Left ventricular ejection fraction, by estimation, is 60 to 65%. The left ventricle has normal function. The left ventricle has no regional wall motion abnormalities. Left ventricular  diastolic parameters were normal.  2. Right ventricular systolic function is normal. The right ventricular size is normal. Tricuspid regurgitation signal is inadequate for assessing PA pressure.  3. The mitral valve is normal in structure. No evidence of mitral valve regurgitation.  4. The aortic valve was not well visualized. Aortic valve regurgitation is not visualized.  5. The inferior vena cava is normal in size with greater than 50% respiratory variability, suggesting right atrial pressure of 3 mmHg. Conclusion(s)/Recommendation(s): Normal biventricular function without evidence of hemodynamically significant valvular heart disease. FINDINGS  Left Ventricle: Left ventricular ejection fraction, by estimation, is 60 to 65%. The left ventricle has normal function. The left ventricle has no regional wall motion abnormalities. The left ventricular internal cavity size was normal in size. There is  no left ventricular hypertrophy. Left ventricular diastolic parameters were normal. Right Ventricle: The right ventricular size is normal. No increase in right ventricular wall thickness. Right ventricular systolic function is normal. Tricuspid regurgitation signal is inadequate for assessing PA pressure. Left Atrium: Left atrial size was normal in size. Right Atrium: Right atrial size was normal in size. Pericardium: There is no evidence of pericardial effusion. Mitral Valve: The mitral valve is normal in structure. No evidence of mitral valve regurgitation. Tricuspid Valve: The tricuspid valve is normal in structure. Tricuspid valve regurgitation is not demonstrated. Aortic Valve: The aortic valve was not well visualized. Aortic valve regurgitation is not visualized. Pulmonic Valve: The pulmonic valve was normal in structure. Pulmonic valve regurgitation is not visualized. Aorta: The aortic root is normal in size and structure. Venous: The inferior vena cava is normal in size with greater than 50% respiratory  variability, suggesting right atrial pressure of 3 mmHg. IAS/Shunts: The atrial septum is grossly normal.  LEFT VENTRICLE PLAX 2D LVIDd:         4.20 cm     Diastology LVIDs:         2.80 cm     LV e' medial:    8.49 cm/s LV PW:         0.70 cm     LV E/e' medial:  9.5 LV IVS:        0.70 cm     LV e' lateral:   9.25 cm/s LVOT diam:     1.80 cm     LV E/e' lateral: 8.7 LV SV:         49 LV SV Index:   27 LVOT Area:     2.54 cm  LV Volumes (MOD) LV vol d, MOD A2C: 75.7 ml LV vol d, MOD A4C: 77.1 ml LV vol s, MOD A2C: 31.8 ml LV vol s, MOD A4C: 33.9 ml LV SV MOD A2C:     43.9 ml LV SV MOD A4C:  77.1 ml LV SV MOD BP:      44.4 ml RIGHT VENTRICLE RV S prime:     11.50 cm/s TAPSE (M-mode): 1.6 cm LEFT ATRIUM           Index       RIGHT ATRIUM          Index LA diam:      2.80 cm 1.53 cm/m  RA Area:     8.00 cm LA Vol (A2C): 31.4 ml 17.11 ml/m RA Volume:   14.10 ml 7.69 ml/m LA Vol (A4C): 10.4 ml 5.67 ml/m  AORTIC VALVE LVOT Vmax:   103.00 cm/s LVOT Vmean:  77.300 cm/s LVOT VTI:    0.192 m  AORTA Ao Root diam: 2.40 cm MITRAL VALVE MV Area (PHT): 3.65 cm    SHUNTS MV Decel Time: 208 msec    Systemic VTI:  0.19 m MV E velocity: 80.50 cm/s  Systemic Diam: 1.80 cm MV A velocity: 58.80 cm/s MV E/A ratio:  1.37 Rudean Haskell MD Electronically signed by Rudean Haskell MD Signature Date/Time: 04/12/2020/3:52:08 PM    Final    ECHO TEE  Result Date: 05/08/2020    TRANSESOPHOGEAL ECHO REPORT   Patient Name:   Marissa Crawford Date of Exam: 05/08/2020 Medical Rec #:  [id number]    Height:       61.0 in Accession #:    [id number]   Weight:       181.7 lb Date of Birth:  06-12-1980     BSA:          1.813 m Patient Age:    51 years     BP:           111/75 mmHg Patient Gender: F            HR:           85 bpm. Exam Location:  Inpatient Procedure: Transesophageal Echo and Saline Contrast Bubble Study Indications:     Stroke  History:         Patient has prior history of Echocardiogram examinations, most                   recent 04/12/2020. CHF, Previous Myocardial Infarction, Stroke                  and COPD; Risk Factors:Hypertension, Dyslipidemia and Diabetes.  Sonographer:     Bernadene Person RDCS Referring Phys:  [id number] Abigail Butts Diagnosing Phys: Buford Dresser MD PROCEDURE: After discussion of the risks and benefits of a TEE, an informed consent was obtained from the patient. The transesophogeal probe was passed without difficulty through the esophogus of the patient. Sedation performed by different physician. The patient was monitored while under deep sedation. Anesthestetic sedation was provided intravenously by Anesthesiology: 557.88mg  of Propofol, 60mg  of Lidocaine. Image quality was good. The patient's vital signs; including heart rate, blood pressure, and oxygen saturation; remained stable throughout the procedure. The patient developed no complications during the procedure. IMPRESSIONS  1. Left ventricular ejection fraction, by estimation, is 60 to 65%. The left ventricle has normal function.  2. Right ventricular systolic function is normal. The right ventricular size is normal.  3. No left atrial/left atrial appendage thrombus was detected.  4. The mitral valve is normal in structure. Trivial mitral valve regurgitation. No evidence of mitral stenosis.  5. The aortic valve is tricuspid. Aortic valve regurgitation is not visualized. No aortic stenosis is present.  6. No shunt  by Color doppler. Agitated saline contrast bubble study was positive with shunting observed after >6 cardiac cycles suggestive of intrapulmonary shunting. Conclusion(s)/Recommendation(s): Normal biventricular function without evidence of hemodynamically significant valvular heart disease. No evidence of cardiac source of embolism. Bubble study negative for intraatrial shunt. Scant late bubble seen, may be due to extracardiac shunt. FINDINGS  Left Ventricle: Left ventricular ejection fraction, by estimation, is 60 to 65%. The left  ventricle has normal function. The left ventricular internal cavity size was normal in size. Right Ventricle: The right ventricular size is normal. No increase in right ventricular wall thickness. Right ventricular systolic function is normal. Left Atrium: Left atrial size was not assessed. No left atrial/left atrial appendage thrombus was detected. Right Atrium: Right atrial size was not assessed. Pericardium: There is no evidence of pericardial effusion. Mitral Valve: The mitral valve is normal in structure. Trivial mitral valve regurgitation. No evidence of mitral valve stenosis. There is no evidence of mitral valve vegetation. Tricuspid Valve: The tricuspid valve is normal in structure. Tricuspid valve regurgitation is trivial. No evidence of tricuspid stenosis. There is no evidence of tricuspid valve vegetation. Aortic Valve: The aortic valve is tricuspid. Aortic valve regurgitation is not visualized. No aortic stenosis is present. There is no evidence of aortic valve vegetation. Pulmonic Valve: The pulmonic valve was grossly normal. Pulmonic valve regurgitation is trivial. No evidence of pulmonic stenosis. There is no evidence of pulmonic valve vegetation. Aorta: The aortic root and ascending aorta are structurally normal, with no evidence of dilitation. IAS/Shunts: No shunt by Color doppler. Agitated saline contrast was given intravenously to evaluate for intracardiac shunting. Agitated saline contrast bubble study was positive with shunting observed after >6 cardiac cycles suggestive of intrapulmonary shunting. There is no evidence of a patent foramen ovale. Buford Dresser MD Electronically signed by Buford Dresser MD Signature Date/Time: 05/08/2020/6:17:05 PM    Final      Labs: BNP (last 3 results) No results for input(s): BNP in the last 8760 hours. Basic Metabolic Panel: Recent Labs  Lab 05/04/20 0905 05/05/20 1831 05/06/20 1010  NA 136 137 136  K 3.4* 3.1* 3.7  CL 99 98 102   CO2 28 24 24   GLUCOSE 103* 89 153*  BUN 20 14 13   CREATININE 1.57* 1.17* 0.94  CALCIUM 9.6 9.6 9.2   Liver Function Tests: Recent Labs  Lab 05/04/20 0905 05/05/20 1831  AST 38* 42*  ALT 37* 38  ALKPHOS 70 67  BILITOT 0.5 0.4  PROT 7.8 8.2*  ALBUMIN 4.3 4.0   No results for input(s): LIPASE, AMYLASE in the last 168 hours. No results for input(s): AMMONIA in the last 168 hours. CBC: Recent Labs  Lab 05/04/20 0905 05/05/20 1831  WBC 8.1 8.8  NEUTROABS 4.9 4.9  HGB 12.6 13.3  HCT 37.1 38.8  MCV 97.3 96.3  PLT 332.0 353   Cardiac Enzymes: No results for input(s): CKTOTAL, CKMB, CKMBINDEX, TROPONINI in the last 168 hours. BNP: Invalid input(s): POCBNP CBG: No results for input(s): GLUCAP in the last 168 hours. D-Dimer No results for input(s): DDIMER in the last 72 hours. Hgb A1c No results for input(s): HGBA1C in the last 72 hours.  Lipid Profile No results for input(s): CHOL, HDL, LDLCALC, TRIG, CHOLHDL, LDLDIRECT in the last 72 hours.  Thyroid function studies No results for input(s): TSH, T4TOTAL, T3FREE, THYROIDAB in the last 72 hours.  Invalid input(s): FREET3 Anemia work up No results for input(s): VITAMINB12, FOLATE, FERRITIN, TIBC, IRON, RETICCTPCT in the last 14  hours. Urinalysis    Component Value Date/Time   LABSPEC 1.020 07/11/2011 1312   PHURINE 6.5 07/11/2011 1312   GLUCOSEU NEGATIVE 07/11/2011 1312   HGBUR SMALL (A) 07/11/2011 1312   BILIRUBINUR negative 02/07/2019 1139   BILIRUBINUR neg 12/23/2016 1206   KETONESUR trace (5) (A) 02/07/2019 1139   KETONESUR NEGATIVE 07/11/2011 1312   PROTEINUR =30 (A) 02/07/2019 1139   PROTEINUR neg 12/23/2016 1206   PROTEINUR NEGATIVE 07/11/2011 1312   UROBILINOGEN 0.2 02/07/2019 1139   UROBILINOGEN 1.0 07/11/2011 1312   NITRITE Negative 02/07/2019 1139   NITRITE neg 12/23/2016 1206   NITRITE POSITIVE (A) 07/11/2011 1312   LEUKOCYTESUR Negative 02/07/2019 1139   Sepsis Labs Invalid input(s):  PROCALCITONIN,  WBC,  LACTICIDVEN Microbiology Recent Results (from the past 240 hour(s))  SARS CORONAVIRUS 2 (TAT 6-24 HRS) Nasopharyngeal Nasopharyngeal Swab     Status: None   Collection Time: 05/05/20 11:21 PM   Specimen: Nasopharyngeal Swab  Result Value Ref Range Status   SARS Coronavirus 2 NEGATIVE NEGATIVE Final    Comment: (NOTE) SARS-CoV-2 target nucleic acids are NOT DETECTED.  The SARS-CoV-2 RNA is generally detectable in upper and lower respiratory specimens during the acute phase of infection. Negative results do not preclude SARS-CoV-2 infection, do not rule out co-infections with other pathogens, and should not be used as the sole basis for treatment or other patient management decisions. Negative results must be combined with clinical observations, patient history, and epidemiological information. The expected result is Negative.  Fact Sheet for Patients: SugarRoll.be  Fact Sheet for Healthcare Providers: https://www.woods-mathews.com/  This test is not yet approved or cleared by the [place] FDA and  has been authorized for detection and/or diagnosis of SARS-CoV-2 by FDA under an Emergency Use Authorization (EUA). This EUA will remain  in effect (meaning this test can be used) for the duration of the COVID-19 declaration under Se ction 564(b)(1) of the Act, 21 U.S.C. section 360bbb-3(b)(1), unless the authorization is terminated or revoked sooner.  Performed at [facility], [place] [address] [place],  [address]      Time coordinating discharge: 25 minutes  SIGNED: Antonieta Pert, MD  [facility] 05/09/2020, 4:23 PM  If 7PM-7AM, please contact night-coverage [email or web address]

## 2020-05-08 NOTE — Progress Notes (Signed)
STROKE TEAM PROGRESS NOTE   INTERVAL HISTORY No acute events since arrival.   Patient changed her mind and decided to stay for recommended TEE.  Awaiting TEE.  We discussed her stroke diagnosis and answered her questions. She was again advised to avoid strenuous activity or heavy lifting until she is seen in follow up. She was counseled regarding her stroke risk factors. She is willing to quit smoking. She states understanding of dietary changes discussed.   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   OBJECTIVE Vitals:   05/08/20 1410 05/08/20 1420 05/08/20 1430 05/08/20 1504  BP: (!) 85/53 109/71 102/65 (!) 132/92  Pulse: 85 85 82 89  Resp: (!) 22 (!) 27 16 16   Temp: (!) 97.4 F (36.3 C)   98.6 F (37 C)  TempSrc: Tympanic   Oral  SpO2: 97% 96% 99% 100%  Weight:       CBC:  Recent Labs  Lab 05/04/20 0905 05/05/20 1831  WBC 8.1 8.8  NEUTROABS 4.9 4.9  HGB 12.6 13.3  HCT 37.1 38.8  MCV 97.3 96.3  PLT 332.0 0000000   Basic Metabolic Panel:  Recent Labs  Lab 05/05/20 1831 05/06/20 1010  NA 137 136  K 3.1* 3.7  CL 98 102  CO2 24 24  GLUCOSE 89 153*  BUN 14 13  CREATININE 1.17* 0.94  CALCIUM 9.6 9.Crawford   Lipid Panel:     Component Value Date/Time   CHOL 188 05/06/2020 0516   TRIG 134 05/06/2020 0516   HDL 22 (L) 05/06/2020 0516   CHOLHDL 8.5 05/06/2020 0516   VLDL 27 05/06/2020 0516   LDLCALC 139 (H) 05/06/2020 0516   HgbA1c:  Lab Results  Component Value Date   HGBA1C 5.9 (H) 05/06/2020   Urine Drug Screen: No results found for: LABOPIA, COCAINSCRNUR, LABBENZ, AMPHETMU, THCU, LABBARB  Alcohol Level No results found for: ETH  IMAGING  CT Angio Head W or Wo Contrast  CT Angio Neck W and/or Wo Contrast 05/05/2020  CT HEAD  IMPRESSION:  1. Evolving acute right PICA territory infarct, stable from previous MRI. No associated hemorrhage or significant regional mass effect.  Crawford. Otherwise negative head CT. No other acute  intracranial abnormality.   CTA HEAD AND NECK  IMPRESSION:  1. Occlusion of the right vertebral artery at its origin, suspected to be acute in nature given the acute right PICA territory infarct, possibly related to dissection. Distal reconstitution of the right V4 segment via retrograde filling across the vertebrobasilar junction. The proximal right PICA is perfused.  Crawford. Otherwise negative CTA of the head and neck. No other large vessel occlusion, hemodynamically significant stenosis, or other acute vascular abnormality.   MR Brain Wo Contrast 05/05/2020   IMPRESSION:  Acute infarct right PICA territory. Negative for hemorrhage. Otherwise negative. Recommend further vascular evaluation to rule out vertebral dissection or embolic source.   Transthoracic Echocardiogram  04/12/2020 IMPRESSIONS  1. Left ventricular ejection fraction, by estimation, is 60 to 65%.  ECG - SR rate 99 BPM. (See cardiology reading for complete details)  PHYSICAL EXAM Blood pressure (!) 132/92, pulse 89, temperature 98.6 F (37 C), temperature source Oral, resp. rate 16, weight 82.4 kg, last menstrual period 04/30/2020, SpO2 100 %, not currently breastfeeding. GENERAL: She is doing well at this time.  HEENT: No scalp tenderness noted.  Neck is supple and no trauma noted.  EXTREMITIES: No edema   BACK: Normal  SKIN: Normal by inspection.  MENTAL STATUS: Alert and oriented. Speech, language and cognition are generally intact. Judgment and insight normal.   CRANIAL NERVES: Pupils are equal, round and reactive to light and accomodation; extra ocular movements are full, there is no significant nystagmus; visual fields are full; upper and lower facial muscles are normal in strength and symmetric, there is no flattening of the nasolabial folds; tongue is midline; uvula is midline; shoulder elevation is normal.  MOTOR: Normal tone, bulk and strength; no pronator drift.  There is no drift of the upper or lower  extremities.  COORDINATION: Left finger to nose is normal, right finger to nose is normal, No rest tremor; no intention tremor; no postural tremor; no bradykinesia.  SENSATION: Normal to light touch, temperature, and pain   ASSESSMENT/PLAN Marissa Crawford is a 40 y.o. female with a history of ongoing tobacco use and hypertension who awoke Monday with a right sided HA, dizziness, right sided incoordination, right sided ataxia with right neck and shoulder discomfort. The sxs resolved over several days but on 05/05/20 an outpt MRI revealed a large right cerebellar ischemic infarction. She did not receive IV t-PA due to late presentation (>4.5 hours from time of onset).  Stroke: Acute infarct right PICA territory - embolic - likely due to dissection of right vertebral artery.  Resultant mild ataxia.  Code Stroke CT Head - not ordered  CT head - Evolving acute right PICA territory infarct, stable from previous MRI. No associated hemorrhage or significant regional mass effect. Otherwise negative head CT. No other acute intracranial abnormality.   MRI head - Acute infarct right PICA territory. Negative for hemorrhage. Otherwise negative. Recommend further vascular evaluation to rule out vertebral dissection or embolic source.  MRA head - not ordered  CTA H&N - Occlusion of the right vertebral artery at its origin, suspected to be acute in nature given the acute right PICA territory infarct, possibly related to dissection. Distal reconstitution of the right V4 segment via retrograde filling across the vertebrobasilar junction. The proximal right PICA is perfused. Otherwise negative CTA of the head and neck. No other large vessel occlusion, hemodynamically significant stenosis, or other acute vascular abnormality.   CT Perfusion - not ordered  Vasculitis labs - pending ; Sed rate 121 (H)  Carotid Doppler - CTA neck ordered - carotid dopplers not indicated.  2D Echo - EF 60 - 65%. No cardiac  source of emboli identified.   TEE pending  Marissa Crawford - negative  LDL - 139  HgbA1c - 5.9  UDS - not ordered  VTE prophylaxis - SCDs Diet: regular diet   No antithrombotic prior to admission, Plan is for  aspirin 81 mg daily and clopidogrel 75 mg daily x 3 months.   Patient counseled to be compliant with her antithrombotic medications.   Ongoing aggressive stroke risk factor management  Therapy recommendations:  No PT or OT follow up is recommended.   Disposition:  Home   S/p Dissection precautions: Patient was counseled to avoid strenuous activity or heavy lifting  Neurology follow up appointment requested.   Hypertension  Home BP meds: HCTZ ; Losartan ; metoprolol  Current BP meds: HCTZ ; Losartan ; metoprolol  Stable . Permissive hypertension (OK if < 220/120) but gradually normalize in 5-7 days  . Long-term BP goal normotensive  Hyperlipidemia  Home Lipid lowering medication: none  LDL 139, goal < 70  Clarified that patient is not currently nursing a child.Lipitor 40mg  initiated.    Continue statin  at discharge  Other Stroke Risk Factors  Cigarette smoker - advised to stop smoking  ETOH use, advised to drink no more than 1 alcoholic beverage per day.  Obesity, recommend weight loss, diet and exercise as appropriate   Family hx stroke (maternal grandfather and paternal grandmother)  Other Active Problems, Findings, Recommendations and/or Plan  Code status - Full code  To contact Stroke Continuity provider, please refer to [email or web address] After hours, contact General Neurology

## 2020-05-08 NOTE — Transfer of Care (Signed)
Immediate Anesthesia Transfer of Care Note  Patient: Marissa Crawford  Procedure(s) Performed: TRANSESOPHAGEAL ECHOCARDIOGRAM (TEE) (N/A ) BUBBLE STUDY  Patient Location: Endoscopy Unit  Anesthesia Type:MAC  Level of Consciousness: drowsy and patient cooperative  Airway & Oxygen Therapy: Patient Spontanous Breathing  Post-op Assessment: Report given to RN and Post -op Vital signs reviewed and stable  Post vital signs: Reviewed and stable  Last Vitals:  Vitals Value Taken Time  BP 85/53   Temp    Pulse 83   Resp 28   SpO2 97     Last Pain:  Vitals:   05/08/20 1221  TempSrc: Temporal  PainSc: 0-No pain         Complications: No complications documented.

## 2020-05-08 NOTE — CV Procedure (Signed)
    TRANSESOPHAGEAL ECHOCARDIOGRAM   NAME:  Marissa Crawford   MRN: [id number] DOB:  05/14/1980   ADMIT DATE: 05/05/2020  INDICATIONS: CVA  PROCEDURE:   Informed consent was obtained prior to the procedure. The risks, benefits and alternatives for the procedure were discussed and the patient comprehended these risks.  Risks include, but are not limited to, cough, sore throat, vomiting, nausea, somnolence, esophageal and stomach trauma or perforation, bleeding, low blood pressure, aspiration, pneumonia, infection, trauma to the teeth and death.    Procedural time out performed.   Patient received monitored anesthesia care under the supervision of Dr. Daiva Huge. Patient received a total of 558 mg propofol and 60 mg lidocaine during the procedure.  The transesophageal probe was inserted in the esophagus and stomach without difficulty and multiple views were obtained.    COMPLICATIONS:    There were no immediate complications.  FINDINGS:  LEFT VENTRICLE: EF = 60-65%.  No regional wall motion abnormalities.  RIGHT VENTRICLE: Normal size and function.   LEFT ATRIUM: No thrombus/mass.  LEFT ATRIAL APPENDAGE: No thrombus/mass.   RIGHT ATRIUM: No thrombus/mass.  AORTIC VALVE:  Trileaflet. No regurgitation. No vegetation.  MITRAL VALVE:    Normal structure. Trivial regurgitation. No vegetation.  TRICUSPID VALVE: Normal structure. Trivial regurgitation. No vegetation.  PULMONIC VALVE: Grossly normal structure. Trivial regurgitation. No apparent vegetation.  INTERATRIAL SEPTUM: No PFO or ASD seen by color Doppler. Agitated saline contrast negative for intraatrial shunt, but there were rare late bubble seen, may have extracardiac shunt.  PERICARDIUM: No effusion noted.  DESCENDING AORTA: No significant plaque seen   CONCLUSION: No intracardiac source of embolism noted. Negative early bubble study, positive late bubble suggests there may be a small extracardiac shunt.   Buford Dresser, MD, PhD [facility]  [address] [place], [place] [address] [phone]   2:07 PM

## 2020-05-08 NOTE — Anesthesia Postprocedure Evaluation (Signed)
Anesthesia Post Note  Patient: Marissa Crawford  Procedure(s) Performed: TRANSESOPHAGEAL ECHOCARDIOGRAM (TEE) (N/A ) BUBBLE STUDY     Patient location during evaluation: PACU Anesthesia Type: MAC Level of consciousness: awake and alert and oriented Pain management: pain level controlled Vital Signs Assessment: post-procedure vital signs reviewed and stable Respiratory status: spontaneous breathing, nonlabored ventilation and respiratory function stable Cardiovascular status: blood pressure returned to baseline Postop Assessment: no apparent nausea or vomiting Anesthetic complications: no   No complications documented.  Last Vitals:  Vitals:   05/08/20 1420 05/08/20 1430  BP: 109/71 102/65  Pulse: 85 82  Resp: (!) 27 16  Temp:    SpO2: 96% 99%    Last Pain:  Vitals:   05/08/20 1430  TempSrc:   PainSc: 0-No pain                 Brennan Bailey

## 2020-05-08 NOTE — Interval H&P Note (Signed)
History and Physical Interval Note:  05/08/2020 1:12 PM  Marissa Crawford  has presented today for surgery, with the diagnosis of STROKE.  The various methods of treatment have been discussed with the patient and family. After consideration of risks, benefits and other options for treatment, the patient has consented to  Procedure(s): TRANSESOPHAGEAL ECHOCARDIOGRAM (TEE) (N/A) as a surgical intervention.  The patient's history has been reviewed, patient examined, no change in status, stable for surgery.  I have reviewed the patient's chart and labs.  Questions were answered to the patient's satisfaction.     Kennita Pavlovich Harrell Gave

## 2020-05-08 NOTE — Progress Notes (Signed)
  Echocardiogram Echocardiogram Transesophageal has been performed.  Marissa Crawford 05/08/2020, 2:41 PM

## 2020-05-09 ENCOUNTER — Other Ambulatory Visit: Payer: Self-pay | Admitting: *Deleted

## 2020-05-09 ENCOUNTER — Encounter (HOSPITAL_COMMUNITY): Payer: Self-pay | Admitting: Cardiology

## 2020-05-09 NOTE — Patient Outreach (Signed)
[facility]) Care Management  05/09/2020  Marissa Crawford 08-01-1980 [id number]  Transition of care call/case closure   Referral received:05/05/20 Initial outreach:05/09/20 Insurance: [place] [employer]    Subjective: Initial successful telephone call to patient's preferred number in order to complete transition of care assessment; 2 HIPAA identifiers verified. Explained purpose of call and completed transition of care assessment.  Marissa Crawford states that she is doing pretty good, so far so good. She denies headache, dizziness , reviewed Stroke warning symptoms with [facility], she denies any new concerns, reinforced getting help right away if symptoms. She reports tolerating diet, denies bowel or bladder problems.  She reports having her nephew present to assist as needed. 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 Reviewed [place] benefit for education support on smoking cessation she states its been 3 days and doing well so far and plans to remain no smoking.      Reviewed accessing the following [place] Benefits :  She discussed  ongoing health issues hypertension and hyperlipidemia she monitors blood pressure at home,has plans for exercise program. Reviewed [facility] benefits for managing chronic conditions.  She plans to review active health management site .   She does  have the hospital indemnity, provided contact number to file a claim [phone] [employer].  Provided patient with resource contact  for find a doctor in network if decides to pursue new PCP.  She  uses a [facility] outpatient [facility] , she explains that she has not received call regarding her prescriptions being ready since discharge on yesterday, viewed epic prescriptions for Lipitor, Plavix and aspirin E- prescribing confirmed by pharmacy. Marissa Crawford states she will call pharmacy to verify and pick up medication on today. She voiced I know that it is important  .  Objective:  Marissa Crawford was  hospitalized at [facility] for Right vertebral PICA territory infarct  1/29-05/08/20  for Comorbidities include:  Hypertension , hyperlipidemia She  was discharged to home on 05/08/20 without the need for home health services or DME.   Assessment:  Patient voices good understanding of all discharge instructions.  See transition of care flowsheet for assessment details.   Plan:  Reviewed hospital discharge diagnosis of Stroke    and discharge treatment plan using hospital discharge instructions, assessing medication adherence, reviewing problems requiring provider notification, and discussing the importance of follow up with surgeon, primary care provider and/or specialists as directed.  Reviewed [place] healthy lifestyle program information to receive discounted premium for  2023   Step 1: Get  your annual physical  Step 2: Complete your health assessment  Step 3:Identify your current health status and complete the corresponding action step between April 07, 2020 and December 06, 2020.     No ongoing care management needs identified so will close case to [place] Management services and route successful outreach letter with [place] Management pamphlet and 24 Hour Nurse Line Magnet to [place] Management clinical pool to be mailed to patient's home address.  Thanked patient for their services to [facility].  Marissa Draft, RN, BSN  [place] Management Coordinator  [phone]- Mobile [phone]- Toll Free Main Office

## 2020-05-11 LAB — ANTIPHOSPHOLIPID SYNDROME EVAL, BLD
Anticardiolipin IgA: <9 APL U/mL (ref 0–11)
Anticardiolipin IgG: <9 GPL U/mL (ref 0–14)
Anticardiolipin IgM: 66 MPL U/mL — ABNORMAL HIGH (ref 0–12)
DRVVT: 45.3 s (ref 0.0–47.0)
PTT Lupus Anticoagulant: 35.4 s (ref 0.0–51.9)
Phosphatydalserine, IgA: <1 APS Units (ref 0–19)
Phosphatydalserine, IgG: 9 Units (ref 0–30)
Phosphatydalserine, IgM: 25 Units (ref 0–30)

## 2020-05-18 ENCOUNTER — Other Ambulatory Visit: Payer: Self-pay

## 2020-05-18 ENCOUNTER — Ambulatory Visit: Payer: 59 | Admitting: Family Medicine

## 2020-05-18 ENCOUNTER — Other Ambulatory Visit: Payer: Self-pay | Admitting: Family Medicine

## 2020-05-18 ENCOUNTER — Encounter: Payer: Self-pay | Admitting: Family Medicine

## 2020-05-18 VITALS — BP 150/100 | HR 87 | Temp 98.2°F | Resp 18 | Ht 61.0 in | Wt 182.0 lb

## 2020-05-18 DIAGNOSIS — I10 Essential (primary) hypertension: Secondary | ICD-10-CM | POA: Diagnosis not present

## 2020-05-18 DIAGNOSIS — I639 Cerebral infarction, unspecified: Secondary | ICD-10-CM | POA: Diagnosis not present

## 2020-05-18 DIAGNOSIS — I7774 Dissection of vertebral artery: Secondary | ICD-10-CM | POA: Diagnosis not present

## 2020-05-18 LAB — BASIC METABOLIC PANEL
BUN: 9 mg/dL (ref 7–25)
CO2: 26 mmol/L (ref 20–32)
Calcium: 8.9 mg/dL (ref 8.6–10.2)
Chloride: 106 mmol/L (ref 98–110)
Creat: 0.74 mg/dL (ref 0.50–1.10)
Glucose, Bld: 71 mg/dL (ref 65–99)
Potassium: 3.7 mmol/L (ref 3.5–5.3)
Sodium: 141 mmol/L (ref 135–146)

## 2020-05-18 MED ORDER — LOSARTAN POTASSIUM 50 MG PO TABS
50.0000 mg | ORAL_TABLET | Freq: Every day | ORAL | 3 refills | Status: DC
Start: 2020-05-18 — End: 2020-05-18

## 2020-05-18 MED FILL — LOSARTAN POTASSIUM 50 MG TA: 50 | 30 days supply | Qty: 30 | Fill #0

## 2020-05-18 NOTE — Progress Notes (Signed)
Patient ID: Marissa Crawford, female    DOB: [birth year]  Age: 40 y.o. MRN: [id number]    Subjective:  Subjective  HPI Marissa Crawford presents for f/u hosp for vertebral artery dissection and acute ischemic stroke.   She presented to er with worsening ha and neck pain    Ct angio and mri brain done  She saw neuro in hosp and was d/c on plavix and aspirin  Headache and neck pain are gone   Review of Systems  Constitutional: Negative for appetite change, diaphoresis, fatigue and unexpected weight change.  Eyes: Negative for pain, redness and visual disturbance.  Respiratory: Negative for cough, chest tightness, shortness of breath and wheezing.   Cardiovascular: Negative for chest pain, palpitations and leg swelling.  Endocrine: Negative for cold intolerance, heat intolerance, polydipsia, polyphagia and polyuria.  Genitourinary: Negative for difficulty urinating, dysuria and frequency.  Neurological: Negative for dizziness, light-headedness, numbness and headaches.    History Past Medical History:  Diagnosis Date  . Acute CVA (cerebrovascular accident) ([facility]) 05/05/2020  . AKI (acute kidney injury) ([place]) 05/05/2020  . HTN (hypertension)     She has a past surgical history that includes TEE without cardioversion (N/A, 05/08/2020) and Bubble study (05/08/2020).   Her family history includes Alcohol abuse in her father; Cancer (age of onset: 17) in her mother; Cervical cancer in her mother; Diabetes in her brother, maternal grandfather, maternal grandmother, paternal grandfather, and paternal grandmother; Gout in her father; Hypertension in her father; Seizures in her sister; Stroke in her maternal grandfather and paternal grandmother.She reports that she has been smoking cigarettes. She started smoking about 21 years ago. She has a 20.00 pack-year smoking history. She has never used smokeless tobacco. She reports current alcohol use of about 2.0 standard drinks of alcohol per week. She reports that she does  not use drugs.  Current Outpatient Medications on File Prior to Visit  Medication Sig Dispense Refill  . aspirin EC 81 MG EC tablet Take 1 tablet (81 mg total) by mouth daily. Swallow whole. 30 tablet 11  . atorvastatin (LIPITOR) 40 MG tablet Take 1 tablet (40 mg total) by mouth daily. 30 tablet 1  . clopidogrel (PLAVIX) 75 MG tablet Take 1 tablet (75 mg total) by mouth daily with breakfast. 30 tablet 2  . metoprolol succinate (TOPROL-XL) 100 MG 24 hr tablet Take 1 tablet (100 mg total) by mouth daily. Take with or immediately following a meal. (Patient taking differently: Take 100 mg by mouth daily.) 90 tablet 3   No current facility-administered medications on file prior to visit.     Objective:  Objective  Physical Exam Vitals and nursing note reviewed.  Constitutional:      Appearance: She is well-developed and well-nourished.  HENT:     Head: Normocephalic and atraumatic.  Eyes:     Extraocular Movements: EOM normal.     Conjunctiva/sclera: Conjunctivae normal.  Neck:     Thyroid: No thyromegaly.     Vascular: No carotid bruit or JVD.  Cardiovascular:     Rate and Rhythm: Normal rate and regular rhythm.     Heart sounds: Normal heart sounds. No murmur heard.   Pulmonary:     Effort: Pulmonary effort is normal. No respiratory distress.     Breath sounds: Normal breath sounds. No wheezing or rales.  Chest:     Chest wall: No tenderness.  Musculoskeletal:        General: No edema.     Cervical back: Normal  range of motion and neck supple.  Neurological:     Mental Status: She is alert and oriented to person, place, and time.     Motor: No weakness.     Coordination: Coordination normal.     Gait: Gait normal.  Psychiatric:        Mood and Affect: Mood and affect normal.    BP (!) 150/100 (BP Location: Right Arm, Patient Position: Sitting, Cuff Size: Normal)   Pulse 87   Temp 98.2 F (36.8 C) (Oral)   Resp 18   Ht 5\' 1"  (1.549 m)   Wt 182 lb (82.6 kg)   LMP  04/30/2020   SpO2 100%   BMI 34.39 kg/m  Wt Readings from Last 3 Encounters:  05/18/20 182 lb (82.6 kg)  05/06/20 181 lb 11.2 oz (82.4 kg)  03/26/20 186 lb 12.8 oz (84.7 kg)     Lab Results  Component Value Date   WBC 8.8 05/05/2020   HGB 13.3 05/05/2020   HCT 38.8 05/05/2020   PLT 353 05/05/2020   GLUCOSE 71 05/18/2020   CHOL 188 05/06/2020   TRIG 134 05/06/2020   HDL 22 (L) 05/06/2020   LDLDIRECT 148.0 03/26/2020   LDLCALC 139 (H) 05/06/2020   ALT 38 05/05/2020   AST 42 (H) 05/05/2020   NA 141 05/18/2020   K 3.7 05/18/2020   CL 106 05/18/2020   CREATININE 0.74 05/18/2020   BUN 9 05/18/2020   CO2 26 05/18/2020   TSH 1.75 05/04/2020   INR 1.0 05/05/2020   HGBA1C 5.9 (H) 05/06/2020    CT Angio Head W or Wo Contrast  Result Date: 05/05/2020 CLINICAL DATA:  Initial evaluation for acute stroke. EXAM: CT ANGIOGRAPHY HEAD AND NECK TECHNIQUE: Multidetector CT imaging of the head and neck was performed using the standard protocol during bolus administration of intravenous contrast. Multiplanar CT image reconstructions and MIPs were obtained to evaluate the vascular anatomy. Carotid stenosis measurements (when applicable) are obtained utilizing NASCET criteria, using the distal internal carotid diameter as the denominator. CONTRAST:  107mL OMNIPAQUE IOHEXOL 350 MG/ML SOLN COMPARISON:  Prior MRI from earlier the same day. FINDINGS: CT HEAD FINDINGS Brain: Evolving right PICA territory infarct again seen, stable from previous MRI. No associated hemorrhage or significant regional mass effect. Remainder the brain is normal in appearance. No other acute large vessel territory infarct or hemorrhage. No mass lesion, midline shift or significant mass effect. No extra-axial fluid collection. No hydrocephalus. Vascular: No hyperdense vessel. Skull: Scalp soft tissues and calvarium within normal limits. Sinuses: Paranasal sinuses are largely clear.  No mastoid effusion. Orbits: Globes orbital soft  tissues within normal limits. Review of the MIP images confirms the above findings CTA NECK FINDINGS Aortic arch: Visualized aortic arch of normal caliber with normal branch pattern. No hemodynamically significant stenosis seen about the origin of the great vessels. Visualized subclavian arteries widely patent. Right carotid system: Right common and internal carotid arteries patent without stenosis, dissection or occlusion. Mild eccentric calcified plaque at the right bifurcation without significant stenosis. Left carotid system: Left common and internal carotid arteries widely patent without stenosis, dissection or occlusion. Vertebral arteries: Both vertebral arteries arise from the subclavian arteries. Left vertebral artery widely patent within the neck without stenosis, dissection or occlusion. Right vertebral artery occluded at its origin, and remains occluded within the neck. Skeleton: No visible acute osseous abnormality. No discrete or worrisome osseous lesions. Other neck: No other acute soft tissue abnormality within the neck. No mass or adenopathy.  Upper chest: Visualized upper chest demonstrates no acute finding. Review of the MIP images confirms the above findings CTA HEAD FINDINGS Anterior circulation: Petrous segments widely patent. Minimal atheromatous plaque within the carotid siphons without significant stenosis. A1 segments patent bilaterally. Normal anterior communicating artery complex. Anterior cerebral arteries patent to their distal aspects without stenosis. Normal in stenosis or occlusion. Normal MCA bifurcations. Distal MCA branches well perfused and symmetric. Posterior circulation: Left vertebral artery widely patent to the vertebrobasilar junction. Right vertebral artery occluded as it courses into the cranial vault. Retrograde filling of the distal right V4 segment via collateral flow across the vertebrobasilar junction. Perfusion of the origin/proximal aspect of the right PICA. Left  PICA patent as well. Basilar patent to its distal aspect without stenosis. Superior cerebellar arteries patent bilaterally. Both PCA supplied via the basilar as well as small bilateral posterior communicating arteries. Both PCAs well perfused to their distal aspects. Venous sinuses: Patent allowing for timing the contrast bolus. Anatomic variants: None significant. Review of the MIP images confirms the above findings IMPRESSION: CT HEAD IMPRESSION: 1. Evolving acute right PICA territory infarct, stable from previous MRI. No associated hemorrhage or significant regional mass effect. 2. Otherwise negative head CT. No other acute intracranial abnormality. CTA HEAD AND NECK IMPRESSION: 1. Occlusion of the right vertebral artery at its origin, suspected to be acute in nature given the acute right PICA territory infarct, possibly related to dissection. Distal reconstitution of the right V4 segment via retrograde filling across the vertebrobasilar junction. The proximal right PICA is perfused. 2. Otherwise negative CTA of the head and neck. No other large vessel occlusion, hemodynamically significant stenosis, or other acute vascular abnormality. Electronically Signed   By: Jeannine Boga M.D.   On: 05/05/2020 21:42   CT Angio Neck W and/or Wo Contrast  Result Date: 05/05/2020 CLINICAL DATA:  Initial evaluation for acute stroke. EXAM: CT ANGIOGRAPHY HEAD AND NECK TECHNIQUE: Multidetector CT imaging of the head and neck was performed using the standard protocol during bolus administration of intravenous contrast. Multiplanar CT image reconstructions and MIPs were obtained to evaluate the vascular anatomy. Carotid stenosis measurements (when applicable) are obtained utilizing NASCET criteria, using the distal internal carotid diameter as the denominator. CONTRAST:  55mL OMNIPAQUE IOHEXOL 350 MG/ML SOLN COMPARISON:  Prior MRI from earlier the same day. FINDINGS: CT HEAD FINDINGS Brain: Evolving right PICA territory  infarct again seen, stable from previous MRI. No associated hemorrhage or significant regional mass effect. Remainder the brain is normal in appearance. No other acute large vessel territory infarct or hemorrhage. No mass lesion, midline shift or significant mass effect. No extra-axial fluid collection. No hydrocephalus. Vascular: No hyperdense vessel. Skull: Scalp soft tissues and calvarium within normal limits. Sinuses: Paranasal sinuses are largely clear.  No mastoid effusion. Orbits: Globes orbital soft tissues within normal limits. Review of the MIP images confirms the above findings CTA NECK FINDINGS Aortic arch: Visualized aortic arch of normal caliber with normal branch pattern. No hemodynamically significant stenosis seen about the origin of the great vessels. Visualized subclavian arteries widely patent. Right carotid system: Right common and internal carotid arteries patent without stenosis, dissection or occlusion. Mild eccentric calcified plaque at the right bifurcation without significant stenosis. Left carotid system: Left common and internal carotid arteries widely patent without stenosis, dissection or occlusion. Vertebral arteries: Both vertebral arteries arise from the subclavian arteries. Left vertebral artery widely patent within the neck without stenosis, dissection or occlusion. Right vertebral artery occluded at its origin, and remains  occluded within the neck. Skeleton: No visible acute osseous abnormality. No discrete or worrisome osseous lesions. Other neck: No other acute soft tissue abnormality within the neck. No mass or adenopathy. Upper chest: Visualized upper chest demonstrates no acute finding. Review of the MIP images confirms the above findings CTA HEAD FINDINGS Anterior circulation: Petrous segments widely patent. Minimal atheromatous plaque within the carotid siphons without significant stenosis. A1 segments patent bilaterally. Normal anterior communicating artery complex.  Anterior cerebral arteries patent to their distal aspects without stenosis. Normal in stenosis or occlusion. Normal MCA bifurcations. Distal MCA branches well perfused and symmetric. Posterior circulation: Left vertebral artery widely patent to the vertebrobasilar junction. Right vertebral artery occluded as it courses into the cranial vault. Retrograde filling of the distal right V4 segment via collateral flow across the vertebrobasilar junction. Perfusion of the origin/proximal aspect of the right PICA. Left PICA patent as well. Basilar patent to its distal aspect without stenosis. Superior cerebellar arteries patent bilaterally. Both PCA supplied via the basilar as well as small bilateral posterior communicating arteries. Both PCAs well perfused to their distal aspects. Venous sinuses: Patent allowing for timing the contrast bolus. Anatomic variants: None significant. Review of the MIP images confirms the above findings IMPRESSION: CT HEAD IMPRESSION: 1. Evolving acute right PICA territory infarct, stable from previous MRI. No associated hemorrhage or significant regional mass effect. 2. Otherwise negative head CT. No other acute intracranial abnormality. CTA HEAD AND NECK IMPRESSION: 1. Occlusion of the right vertebral artery at its origin, suspected to be acute in nature given the acute right PICA territory infarct, possibly related to dissection. Distal reconstitution of the right V4 segment via retrograde filling across the vertebrobasilar junction. The proximal right PICA is perfused. 2. Otherwise negative CTA of the head and neck. No other large vessel occlusion, hemodynamically significant stenosis, or other acute vascular abnormality. Electronically Signed   By: Jeannine Boga M.D.   On: 05/05/2020 21:42   MR Brain Wo Contrast  Addendum Date: 05/05/2020   ADDENDUM REPORT: 05/05/2020 15:47 ADDENDUM: These results were called by telephone at the time of interpretation on 05/05/2020 at 3:47 pm to  provider Einar Pheasant , who verbally acknowledged these results. Electronically Signed   By: Franchot Gallo M.D.   On: 05/05/2020 15:47   Result Date: 05/05/2020 CLINICAL DATA:  Headache, dizziness, ataxia. EXAM: MRI HEAD WITHOUT CONTRAST TECHNIQUE: Multiplanar, multiecho pulse sequences of the brain and surrounding structures were obtained without intravenous contrast. COMPARISON:  None. FINDINGS: Brain: Acute infarct in the right PICA territory. Moderately large cerebellar infarct is present without hemorrhage. No other acute infarct. No evidence of chronic ischemia Ventricle size normal.  Negative for hemorrhage or mass. Vascular: Normal arterial flow voids. Skull and upper cervical spine: Negative Sinuses/Orbits: Paranasal sinuses clear.  Negative orbit Other: None IMPRESSION: Acute infarct right PICA territory. Negative for hemorrhage. Otherwise negative. Recommend further vascular evaluation to rule out vertebral dissection or embolic source. Electronically Signed: By: Franchot Gallo M.D. On: 05/05/2020 15:39     Assessment & Plan:  Plan  I have discontinued Waleska I. Scarber's losartan-hydrochlorothiazide. I am also having her start on losartan. Additionally, I am having her maintain her metoprolol succinate, aspirin, atorvastatin, and clopidogrel.  Meds ordered this encounter  Medications  . losartan (COZAAR) 50 MG tablet    Sig: Take 1 tablet (50 mg total) by mouth daily.    Dispense:  90 tablet    Refill:  3    Problem List Items Addressed This Visit  Unprioritized   HTN (hypertension)    Poorly controlled will alter medications, encouraged DASH diet, minimize caffeine and obtain adequate sleep. Report concerning symptoms and follow up as directed and as needed Losartan 50 mg added to metoprolol Pt will check bp at home/ work and let us know if it is not coming down       Relevant Medications   losartan (COZAAR) 50 MG tablet   Other Relevant Orders   Basic metabolic panel (Completed)    Stroke ([facility])    F/u neuro con't plavix and asa       Relevant Medications   losartan (COZAAR) 50 MG tablet    Other Visit Diagnoses    Vertebral artery dissection ([facility])    -  Primary   Relevant Medications   losartan (COZAAR) 50 MG tablet   Other Relevant Orders   Ambulatory referral to Neurology      Follow-up: Return in about 3 weeks (around 06/08/2020), or if symptoms worsen or fail to improve, for hypertension.  Ann Held, DO

## 2020-05-18 NOTE — Patient Instructions (Signed)

## 2020-05-19 NOTE — Assessment & Plan Note (Signed)
Poorly controlled will alter medications, encouraged DASH diet, minimize caffeine and obtain adequate sleep. Report concerning symptoms and follow up as directed and as needed Losartan 50 mg added to metoprolol Pt will check bp at home/ work and let us know if it is not coming down

## 2020-05-19 NOTE — Assessment & Plan Note (Signed)
F/u neuro con't plavix and asa

## 2020-05-22 NOTE — Telephone Encounter (Signed)
Called and LVM for patient to return call.  Has follow up in May but PCP has requested for patient to be seen sooner.  Have several visits available next week.  Can schedule patient then if she calls back.

## 2020-05-22 NOTE — Telephone Encounter (Signed)
-----   Message from Garvin Fila, MD sent at 05/22/2020  1:02 PM EST ----- Can you squeeze her in sooner ? ----- Message ----- From: Anette Guarneri Sent: 05/21/2020   7:50 AM EST To: Garvin Fila, MD, Barbaraann Rondo, #  Hey Dr. Leonie Man, Patient has a hospital follow up scheduled with you for 5/2, but we received an urgent referral from pt's PCP requesting an appointment in two weeks. Could you review and advise? Thank you.

## 2020-05-28 ENCOUNTER — Encounter: Payer: Self-pay | Admitting: Neurology

## 2020-05-28 ENCOUNTER — Inpatient Hospital Stay: Payer: 59 | Admitting: Neurology

## 2020-05-31 ENCOUNTER — Telehealth: Payer: Self-pay | Admitting: Family Medicine

## 2020-05-31 NOTE — Telephone Encounter (Signed)
Document faxed to our office to be filled out by provider (5 pages FMLA- [employer]) Document put at front office tray under providers name.

## 2020-06-01 NOTE — Telephone Encounter (Signed)
Couldn't leave VM for patient to call back. Sent mychart message for patient to call office & schedule

## 2020-06-01 NOTE — Telephone Encounter (Signed)
Can we schedule a virtual to have these forms filled out please

## 2020-06-06 ENCOUNTER — Telehealth: Payer: 59 | Admitting: Family Medicine

## 2020-06-06 ENCOUNTER — Other Ambulatory Visit: Payer: Self-pay

## 2020-06-06 ENCOUNTER — Telehealth: Payer: Self-pay | Admitting: Family Medicine

## 2020-06-06 NOTE — Telephone Encounter (Signed)
Document faxed to our office for provider to fill out for pt (FMLA 5 pages) Document put at front office tray under providers name.

## 2020-06-06 NOTE — Telephone Encounter (Signed)
Received another copy of FMLA forms. Pt has been called and Mychart messages have been sent. Pt has not returned calls or messages

## 2020-06-07 ENCOUNTER — Encounter: Payer: 59 | Admitting: Family Medicine

## 2020-06-07 ENCOUNTER — Other Ambulatory Visit: Payer: Self-pay

## 2020-06-07 ENCOUNTER — Telehealth: Payer: Self-pay

## 2020-06-07 NOTE — Telephone Encounter (Signed)
I looked into this immediately with the CMA and the 1:00 pm patient, the patient before patient [name], did not have the appropriate patient modifiers set (they were set at 30 mins) thus only creating a 10 minute window from 1:30 pm-1:40 pm.  CMA made aware of this and she did not notice this when she made the appointment for patient Marissa Crawford.

## 2020-06-07 NOTE — Telephone Encounter (Signed)
Called patient to get a OV set up for FMLA papers & she denied   Patient stated "I dont not care if they fire me...Marland KitchenMarland Kitchen im not worried about getting these papers filled out any longer"

## 2020-06-07 NOTE — Telephone Encounter (Signed)
Pt called because she saw where I had tried to reach her to pre-chart her Mychart visit for Dr Etter Sjogren. Once on the phone pt asked if she needed to remain on the call for Dr Etter Sjogren so I sent Dr Etter Sjogren a teams message explaining that the pt was connecting, and asked if she wanted to speak to her or reschedule, because at this time it was after 1:40. Pt's appointment was at 1:30 pm. Dr Etter Sjogren then said she needed to reschedule and actually come in to be seen. I relayed the message to the patient, she got irritated and asked how Dr Etter Sjogren had a 1:40 appointment in the 1st place when her appointment was at 1:30 and MyChart told her that her appointment was 20 minutes long. Pt chose not to reschedule.

## 2020-06-07 NOTE — Telephone Encounter (Signed)
I called pt exactly Marissa Crawford and tried a few times -- so did heather and tiera---- now she was put in a Marissa Crawford which should not have been able to happen because I had a physical at 140 and went into the room with the cpe --- so we probably need to look into why that happened   thanks

## 2020-06-08 NOTE — Telephone Encounter (Signed)
Does the pt need to be made aware>?

## 2020-06-10 NOTE — Progress Notes (Signed)
This encounter was created in error - please disregard.

## 2020-06-15 ENCOUNTER — Telehealth: Payer: Self-pay | Admitting: Family Medicine

## 2020-06-15 NOTE — Telephone Encounter (Signed)
recieved

## 2020-06-15 NOTE — Telephone Encounter (Signed)
Document faxed to our office for provider to fill out (FMLA - 5 pages - [employer]) Document put at front office tray under providers name.

## 2020-06-18 NOTE — Telephone Encounter (Signed)
See notes from 06/07/20

## 2020-08-06 ENCOUNTER — Inpatient Hospital Stay: Payer: 59 | Admitting: Neurology

## 2020-08-30 MED FILL — Metoprolol Succinate Tab ER 24HR 100 MG (Tartrate Equiv): ORAL | 90 days supply | Qty: 90 | Fill #0 | Status: AC

## 2020-08-31 ENCOUNTER — Other Ambulatory Visit (HOSPITAL_COMMUNITY): Payer: Self-pay

## 2020-09-04 ENCOUNTER — Other Ambulatory Visit (HOSPITAL_COMMUNITY): Payer: Self-pay

## 2020-12-07 ENCOUNTER — Other Ambulatory Visit (HOSPITAL_COMMUNITY): Payer: Self-pay

## 2020-12-07 MED FILL — Metoprolol Succinate Tab ER 24HR 100 MG (Tartrate Equiv): ORAL | 90 days supply | Qty: 90 | Fill #1 | Status: AC

## 2021-01-21 ENCOUNTER — Encounter: Payer: Self-pay | Admitting: Family Medicine

## 2021-01-29 ENCOUNTER — Other Ambulatory Visit (HOSPITAL_COMMUNITY): Payer: Self-pay

## 2021-01-29 ENCOUNTER — Other Ambulatory Visit: Payer: Self-pay

## 2021-01-29 ENCOUNTER — Other Ambulatory Visit (HOSPITAL_BASED_OUTPATIENT_CLINIC_OR_DEPARTMENT_OTHER): Payer: Self-pay

## 2021-01-29 ENCOUNTER — Encounter: Payer: Self-pay | Admitting: Family Medicine

## 2021-01-29 ENCOUNTER — Ambulatory Visit (INDEPENDENT_AMBULATORY_CARE_PROVIDER_SITE_OTHER): Payer: 59 | Admitting: Family Medicine

## 2021-01-29 VITALS — BP 190/98 | HR 74 | Temp 98.1°F | Ht 61.0 in | Wt 182.4 lb

## 2021-01-29 DIAGNOSIS — I10 Essential (primary) hypertension: Secondary | ICD-10-CM | POA: Diagnosis not present

## 2021-01-29 DIAGNOSIS — E785 Hyperlipidemia, unspecified: Secondary | ICD-10-CM

## 2021-01-29 MED ORDER — OMRON 3 SERIES BP MONITOR DEVI
0 refills | Status: AC
  Filled 2021-01-29: qty 1, 1d supply, fill #0

## 2021-01-29 MED ORDER — AMLODIPINE BESYLATE 5 MG PO TABS
5.0000 mg | ORAL_TABLET | Freq: Every day | ORAL | 1 refills | Status: DC
  Filled 2021-01-29 – 2021-02-26 (×2): qty 30, 30d supply, fill #0

## 2021-01-29 MED ORDER — AMLODIPINE BESYLATE 5 MG PO TABS
5.0000 mg | ORAL_TABLET | Freq: Every day | ORAL | 1 refills | Status: DC
  Filled 2021-01-29: qty 30, 30d supply, fill #0

## 2021-01-29 NOTE — Progress Notes (Addendum)
Subjective:   By signing my name below, I, Shehryar Baig, attest that this documentation has been prepared under the direction and in the presence of Dr. Roma Schanz, DO. 01/29/2021    Patient ID: Marissa Crawford, female    DOB: 10/05/1980, 40 y.o.   MRN: [id number]  Chief Complaint  Patient presents with   Hypertension    BP recheck   Cards referral : Dr. Skeet Latch @ [address]    Hypertension Associated symptoms include chest pain (While taking losartan) and headaches. Pertinent negatives include no blurred vision, malaise/fatigue, palpitations or shortness of breath.  Patient is in today for a office visit.   She complains that she chest pain while taking 50 mg losartan daily PO and so she stopped taking it. She reports while taking it at night she had urinary frequency, then when she switched to the morning she started experiencing chest pain. Since she stopped taking it she has no episodes of chest pain. She has no chest pain at this time. She is experiencing headaches at this time. She does not take Plavix nor is she regularly taking baby aspirins at this time. She notes having issues while taking aspirin.  She continues taking 100 mg metoprolol succinate daily PO and reports no new issues while taking it.  No chest pain off aspirin and losartan.    BP Readings from Last 3 Encounters:  01/29/21 (!) 190/98  05/18/20 (!) 150/100  05/01/20 120/81   Pulse Readings from Last 3 Encounters:  01/29/21 74  05/18/20 87  05/01/20 95   She is participating in exercise daily by walking during her lunch break. She does not eat during her lunch break.    Past Medical History:  Diagnosis Date   Acute CVA (cerebrovascular accident) ([place]) 05/05/2020   AKI (acute kidney injury) ([place]) 05/05/2020   HTN (hypertension)     Past Surgical History:  Procedure Laterality Date   BUBBLE STUDY  05/08/2020   Procedure: BUBBLE STUDY;  Surgeon: Buford Dresser, MD;  Location: [facility];  Service: Cardiovascular;;   TEE WITHOUT CARDIOVERSION N/A 05/08/2020   Procedure: TRANSESOPHAGEAL ECHOCARDIOGRAM (TEE);  Surgeon: Buford Dresser, MD;  Location: [facility] ENDOSCOPY;  Service: Cardiovascular;  Laterality: N/A;    Family History  Problem Relation Age of Onset   Cervical cancer Mother    Cancer Mother 29       cervical   Hypertension Father    Alcohol abuse Father    Gout Father    Seizures Sister    Diabetes Brother    Stroke Maternal Grandfather    Diabetes Maternal Grandfather    Stroke Paternal Grandmother    Diabetes Paternal Grandmother    Diabetes Maternal Grandmother    Diabetes Paternal Grandfather     Social History   Socioeconomic History   Marital status: Single    Spouse name: Not on file   Number of children: Not on file   Years of education: Not on file   Highest education level: Not on file  Occupational History   Occupation: Therapist, sports --Med surg    Employer: [employer]  Tobacco Use   Smoking status: Every Day    Packs/day: 1.00    Years: 20.00    Pack years: 20.00    Types: Cigarettes    Start date: 2001   Smokeless tobacco: Never  Substance and Sexual Activity   Alcohol use: Yes    Alcohol/week: 2.0 standard drinks    Types: 2 Standard drinks  or equivalent per week   Drug use: No   Sexual activity: Yes    Partners: Male    Birth control/protection: Condom, None  Other Topics Concern   Not on file  Social History Narrative   Exercise ---cardio   Social Determinants of Health   Financial Resource Strain: Not on file  Food Insecurity: Not on file  Transportation Needs: Not on file  Physical Activity: Not on file  Stress: Not on file  Social Connections: Not on file  Intimate Partner Violence: Not on file    Outpatient Medications Prior to Visit  Medication Sig Dispense Refill   Blood Pressure Monitoring (OMRON 3 SERIES BP MONITOR) DEVI use as directed 1 each 0   losartan-hydrochlorothiazide (HYZAAR) 100-25 MG  tablet Take 1 tablet by mouth daily.     metoprolol succinate (TOPROL-XL) 100 MG 24 hr tablet TAKE 1 TABLET (100 MG TOTAL) BY MOUTH DAILY. TAKE WITH OR IMMEDIATELY FOLLOWING A MEAL. 90 tablet 3   losartan (COZAAR) 50 MG tablet TAKE 1 TABLET (50 MG TOTAL) BY MOUTH DAILY. (Patient not taking: Reported on 01/29/2021) 90 tablet 3   amoxicillin-clavulanate (AUGMENTIN) 875-125 MG tablet TAKE 1 TABLET BY MOUTH EVERY 12 (TWELVE) HOURS FOR 7 DAYS. 14 tablet 0   aspirin 81 MG EC tablet TAKE 1 TABLET (81 MG TOTAL) BY MOUTH DAILY. SWALLOW WHOLE. (Patient not taking: Reported on 01/29/2021) 30 tablet 11   atorvastatin (LIPITOR) 40 MG tablet TAKE 1 TABLET (40 MG TOTAL) BY MOUTH DAILY. (Patient not taking: Reported on 01/29/2021) 30 tablet 1   clopidogrel (PLAVIX) 75 MG tablet TAKE 1 TABLET (75 MG TOTAL) BY MOUTH DAILY WITH BREAKFAST. (Patient not taking: Reported on 01/29/2021) 30 tablet 2   No facility-administered medications prior to visit.    No Known Allergies  Review of Systems  Constitutional:  Negative for fever and malaise/fatigue.  HENT:  Negative for congestion.   Eyes:  Negative for blurred vision.  Respiratory:  Negative for shortness of breath.   Cardiovascular:  Positive for chest pain (While taking losartan). Negative for palpitations and leg swelling.  Gastrointestinal:  Negative for abdominal pain, blood in stool and nausea.  Genitourinary:  Negative for dysuria and frequency.  Musculoskeletal:  Negative for falls.  Skin:  Negative for rash.  Neurological:  Positive for headaches. Negative for dizziness and loss of consciousness.  Endo/Heme/Allergies:  Negative for environmental allergies.  Psychiatric/Behavioral:  Negative for depression. The patient is not nervous/anxious.       Objective:    Physical Exam Vitals and nursing note reviewed.  Constitutional:      General: She is not in acute distress.    Appearance: Normal appearance. She is well-developed. She is not  ill-appearing.  HENT:     Head: Normocephalic and atraumatic.     Right Ear: External ear normal.     Left Ear: External ear normal.  Eyes:     Extraocular Movements: Extraocular movements intact.     Conjunctiva/sclera: Conjunctivae normal.     Pupils: Pupils are equal, round, and reactive to light.  Neck:     Thyroid: No thyromegaly.     Vascular: No carotid bruit or JVD.  Cardiovascular:     Rate and Rhythm: Normal rate and regular rhythm.     Heart sounds: Normal heart sounds. No murmur heard.   No gallop.  Pulmonary:     Effort: Pulmonary effort is normal. No respiratory distress.     Breath sounds: Normal breath sounds. No wheezing  or rales.  Chest:     Chest wall: No tenderness.  Musculoskeletal:     Cervical back: Normal range of motion and neck supple.  Skin:    General: Skin is warm and dry.  Neurological:     Mental Status: She is alert and oriented to person, place, and time.  Psychiatric:        Behavior: Behavior normal.        Judgment: Judgment normal.    BP (!) 190/98   Pulse 74   Temp 98.1 F (36.7 C) (Oral)   Ht 5\' 1"  (1.549 m)   Wt 182 lb 6.4 oz (82.7 kg)   LMP 01/24/2021   SpO2 99%   BMI 34.46 kg/m  Wt Readings from Last 3 Encounters:  01/29/21 182 lb 6.4 oz (82.7 kg)  05/18/20 182 lb (82.6 kg)  03/26/20 186 lb 12.8 oz (84.7 kg)    Diabetic Foot Exam - Simple   No data filed    Lab Results  Component Value Date   WBC 8.8 05/05/2020   HGB 13.3 05/05/2020   HCT 38.8 05/05/2020   PLT 353 05/05/2020   GLUCOSE 71 05/18/2020   CHOL 188 05/06/2020   TRIG 134 05/06/2020   HDL 22 (L) 05/06/2020   LDLDIRECT 148.0 03/26/2020   LDLCALC 139 (H) 05/06/2020   ALT 38 05/05/2020   AST 42 (H) 05/05/2020   NA 141 05/18/2020   K 3.7 05/18/2020   CL 106 05/18/2020   CREATININE 0.74 05/18/2020   BUN 9 05/18/2020   CO2 26 05/18/2020   TSH 1.75 05/04/2020   INR 1.0 05/05/2020   HGBA1C 5.9 (H) 05/06/2020    Lab Results  Component Value Date    TSH 1.75 05/04/2020   Lab Results  Component Value Date   WBC 8.8 05/05/2020   HGB 13.3 05/05/2020   HCT 38.8 05/05/2020   MCV 96.3 05/05/2020   PLT 353 05/05/2020   Lab Results  Component Value Date   NA 141 05/18/2020   K 3.7 05/18/2020   CO2 26 05/18/2020   GLUCOSE 71 05/18/2020   BUN 9 05/18/2020   CREATININE 0.74 05/18/2020   BILITOT 0.4 05/05/2020   ALKPHOS 67 05/05/2020   AST 42 (H) 05/05/2020   ALT 38 05/05/2020   PROT 8.2 (H) 05/05/2020   ALBUMIN 4.0 05/05/2020   CALCIUM 8.9 05/18/2020   ANIONGAP 10 05/06/2020   GFR 41.58 (L) 05/04/2020   Lab Results  Component Value Date   CHOL 188 05/06/2020   Lab Results  Component Value Date   HDL 22 (L) 05/06/2020   Lab Results  Component Value Date   LDLCALC 139 (H) 05/06/2020   Lab Results  Component Value Date   TRIG 134 05/06/2020   Lab Results  Component Value Date   CHOLHDL 8.5 05/06/2020   Lab Results  Component Value Date   HGBA1C 5.9 (H) 05/06/2020       Assessment & Plan:   Problem List Items Addressed This Visit       Unprioritized   HTN (hypertension) - Primary    Poorly controlled will alter medications, encouraged DASH diet, minimize caffeine and obtain adequate sleep. Report concerning symptoms and follow up as directed and as needed      Relevant Medications   losartan-hydrochlorothiazide (HYZAAR) 100-25 MG tablet   amLODipine (NORVASC) 5 MG tablet   Other Relevant Orders   Ambulatory referral to Cardiology   Other Visit Diagnoses     Hyperlipidemia,  unspecified hyperlipidemia type       Relevant Medications   losartan-hydrochlorothiazide (HYZAAR) 100-25 MG tablet   amLODipine (NORVASC) 5 MG tablet   Other Relevant Orders   Ambulatory referral to Cardiology        Meds ordered this encounter  Medications   DISCONTD: amLODipine (NORVASC) 5 MG tablet    Sig: Take 1 tablet (5 mg total) by mouth daily.    Dispense:  30 tablet    Refill:  1   DISCONTD: amLODipine  (NORVASC) 5 MG tablet    Sig: Take 1 tablet (5 mg total) by mouth daily.    Dispense:  30 tablet    Refill:  1   amLODipine (NORVASC) 5 MG tablet    Sig: Take 1 tablet (5 mg total) by mouth daily.    Dispense:  30 tablet    Refill:  1    I, Dr. Roma Schanz, DO, personally preformed the services described in this documentation.  All medical record entries made by the scribe were at my direction and in my presence.  I have reviewed the chart and discharge instructions (if applicable) and agree that the record reflects my personal performance and is accurate and complete. 01/29/2021   I,Shehryar Baig,acting as a scribe for Ann Held, DO.,have documented all relevant documentation on the behalf of Ann Held, DO,as directed by  Ann Held, DO while in the presence of Ann Held, DO.   Ann Held, DO

## 2021-01-29 NOTE — Patient Instructions (Signed)

## 2021-02-04 NOTE — Assessment & Plan Note (Signed)
Poorly controlled will alter medications, encouraged DASH diet, minimize caffeine and obtain adequate sleep. Report concerning symptoms and follow up as directed and as needed 

## 2021-02-08 ENCOUNTER — Other Ambulatory Visit (HOSPITAL_COMMUNITY): Payer: Self-pay

## 2021-02-08 MED ORDER — PREDNISONE 20 MG PO TABS
20.0000 mg | ORAL_TABLET | Freq: Every day | ORAL | 0 refills | Status: DC
  Filled 2021-02-08: qty 5, 5d supply, fill #0

## 2021-02-08 MED ORDER — SULFAMETHOXAZOLE-TRIMETHOPRIM 800-160 MG PO TABS
1.0000 | ORAL_TABLET | Freq: Two times a day (BID) | ORAL | 0 refills | Status: DC
  Filled 2021-02-08: qty 14, 7d supply, fill #0

## 2021-02-22 ENCOUNTER — Other Ambulatory Visit (HOSPITAL_COMMUNITY): Payer: Self-pay

## 2021-02-22 DIAGNOSIS — Z01419 Encounter for gynecological examination (general) (routine) without abnormal findings: Secondary | ICD-10-CM | POA: Diagnosis not present

## 2021-02-22 DIAGNOSIS — Z6833 Body mass index (BMI) 33.0-33.9, adult: Secondary | ICD-10-CM | POA: Diagnosis not present

## 2021-02-22 DIAGNOSIS — R319 Hematuria, unspecified: Secondary | ICD-10-CM | POA: Diagnosis not present

## 2021-02-22 DIAGNOSIS — Z113 Encounter for screening for infections with a predominantly sexual mode of transmission: Secondary | ICD-10-CM | POA: Diagnosis not present

## 2021-02-22 MED ORDER — NORETHINDRONE 0.35 MG PO TABS
1.0000 | ORAL_TABLET | Freq: Every day | ORAL | 3 refills | Status: AC
  Filled 2021-02-22: qty 84, 84d supply, fill #0

## 2021-02-26 ENCOUNTER — Other Ambulatory Visit (HOSPITAL_COMMUNITY): Payer: Self-pay

## 2021-02-27 ENCOUNTER — Encounter: Payer: Self-pay | Admitting: Family Medicine

## 2021-02-27 DIAGNOSIS — H5213 Myopia, bilateral: Secondary | ICD-10-CM | POA: Diagnosis not present

## 2021-02-27 DIAGNOSIS — H52223 Regular astigmatism, bilateral: Secondary | ICD-10-CM | POA: Diagnosis not present

## 2021-03-12 NOTE — Progress Notes (Signed)
Cardiology Office Note:    Date:  03/13/2021   ID:  Marissa Crawford, DOB Aug 31, 1980, MRN [id number]  PCP:  Carollee Herter, Alferd Apa, DO  Cardiologist:  None    Referring MD: Carollee Herter, Alferd Apa, *   No chief complaint on file.   History of Present Illness:    Marissa Crawford is a 40 y.o. female with a hx of hypertension, tobacco abuse, obesity, acute CVA, and acute kidney injury who presents today for evaluation of hypertension and hyperlipidemia. She was admitted with a stroke 04/2020. She presented with R-sided weakness. MRI as an outpatient showed a stroke. 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 Her SED rate was elevated at 121 and CRP 5.6. During hospitalization, her blood pressure was controlled on losartan, HCTZ, and metoprolol. She was discharged on aspirin and Plavix. Echo revealed LVEF 60-65% and normal diastolic function. TEE was negative for PFO. There was late shunting suggesting intrapulmonary shunt.  She last saw her PCP Dr. Carollee Herter on 01/2021. She complained of chest pain while taking losartan in the morning and self-discontinued it. She was unsure if it was the aspirin causing it so she stopped that as well. Given that her BP was so elevated she started back taking losartan/HCTZ.  Today, she is doing well. She noticed her blood pressure was increasing while she was pregnant with her daughter 5 years ago. She was given 78m of metoprolol which did not improve her condition. Her blood pressure could go as high as 233/110 so metoprolol was increased to 104mand amlodipine was added. Her blood pressure was still increasing up to 155/90 so losartan was added then discontinued due to adverse reactions. She notices her R shoulder swells when her pressure is high. Her blood pressure at home is similar to her measurements in clinic. She denies snoring but endorses grinding her teeth when she sleeps. She has been smoking for 20 years and has been trying to  quit. She has tried patches and Chantix and is down to 0.25ppd. She exercises for 30 minutes 3 to 4 times per week. She also opts to takes the stairs at work. For diet, she is cutting back on bacon, limits salt or caffeine, and prefers to cook at home. She enjoys an occasional ginger ale and opts for seltzer water because she likes the carbonated feeling. She drinks 4 oz of alcohol 1 to 2 times per week. She does not take supplements, NSAIDs, or ibuprofen. Her father was diagnosed with hypertension later in his life. Her grandmothers on both sides had strokes. She has been told she has lupus of the skin. She works as a reSports coachn [employer] denies any palpitations, chest pain, or shortness of breath, lightheadedness, headaches, syncope, orthopnea, PND, lower extremity edema or exertional symptoms.  Past Medical History:  Diagnosis Date   [place]   [place]   HTN (hypertension)    Resistant hypertension 12/23/2016   Tobacco abuse 03/13/2021    Past Surgical History:  Procedure Laterality Date   BUBBLE STUDY  05/08/2020   Procedure: BUBBLE STUDY;  Surgeon: ChBuford DresserMD;  [facility] Service: Cardiovascular;;   TEE WITHOUT CARDIOVERSION N/A 05/08/2020   Procedure: TRANSESOPHAGEAL ECHOCARDIOGRAM (TEE);  Surgeon: ChBuford DresserMD;  [facility];  Service: Cardiovascular;  Laterality: N/A;    Current Medications: Current Meds  Medication Sig   aspirin EC 81  MG tablet Take 81 mg by mouth daily. Swallow whole.   Blood Pressure Monitoring (OMRON 3 SERIES BP MONITOR) DEVI use as directed   norethindrone (CAMILA) 0.35 MG tablet Take 1 tablet (0.35 mg total) by mouth daily.   Olmesartan-amLODIPine-HCTZ (TRIBENZOR) 40-10-25 MG TABS Take 1 tablet by mouth daily.   [DISCONTINUED] amLODipine (NORVASC) 5 MG tablet Take 1 tablet (5 mg total) by mouth daily.   [DISCONTINUED]  losartan-hydrochlorothiazide (HYZAAR) 100-25 MG tablet Take 1 tablet by mouth daily.   [DISCONTINUED] metoprolol succinate (TOPROL-XL) 100 MG 24 hr tablet TAKE 1 TABLET (100 MG TOTAL) BY MOUTH DAILY. TAKE WITH OR IMMEDIATELY FOLLOWING A MEAL.     Allergies:   Patient has no known allergies.   Social History   Socioeconomic History   Marital status: Single    Spouse name: Not on file   Number of children: Not on file   Years of education: Not on file   Highest education level: Not on file  Occupational History   Occupation: Therapist, sports --Med surg    Employer: [employer]  Tobacco Use   Smoking status: Every Day    Packs/day: 1.00    Years: 20.00    Pack years: 20.00    Types: Cigarettes    Start date: 2001   Smokeless tobacco: Never  Substance and Sexual Activity   Alcohol use: Yes    Alcohol/week: 2.0 standard drinks    Types: 2 Standard drinks or equivalent per week   Drug use: No   Sexual activity: Yes    Partners: Male    Birth control/protection: Condom, None  Other Topics Concern   Not on file  Social History Narrative   Exercise ---cardio   Social Determinants of Health   Financial Resource Strain: Low Risk    Difficulty of Paying Living Expenses: Not hard at all  Food Insecurity: No Food Insecurity   Worried About Charity fundraiser in the Last Year: Never true   Foster in the Last Year: Never true  Transportation Needs: No Transportation Needs   Lack of Transportation (Medical): No   Lack of Transportation (Non-Medical): No  Physical Activity: Insufficiently Active   Days of Exercise per Week: 4 days   Minutes of Exercise per Session: 30 min  Stress: Not on file  Social Connections: Not on file     Family History: The patient's family history includes Alcohol abuse in her father; Cancer (age of onset: 88) in her mother; Cervical cancer in her mother; Diabetes in her brother, maternal grandfather, maternal grandmother, paternal grandfather, and paternal  grandmother; Gout in her father; Hypertension in her father; Seizures in her sister; Stroke in her maternal grandmother and paternal grandmother.  ROS:   Please see the history of present illness.    (+) Teeth grinding (+) Smoking All other systems reviewed and negative.   EKGs/Labs/Other Studies Reviewed:    The following studies were reviewed today: Echo 05/08/20  1. Left ventricular ejection fraction, by estimation, is 60 to 65%. The  left ventricle has normal function.   2. Right ventricular systolic function is normal. The right ventricular  size is normal.   3. No left atrial/left atrial appendage thrombus was detected.   4. The mitral valve is normal in structure. Trivial mitral valve  regurgitation. No evidence of mitral stenosis.   5. The aortic valve is tricuspid. Aortic valve regurgitation is not  visualized. No aortic stenosis is present.   6. No shunt by Color  doppler. Agitated saline contrast bubble study was  positive with shunting observed after >6 cardiac cycles suggestive of  intrapulmonary shunting.  Conclusion(s)/Recommendation(s): Normal biventricular function without  evidence of hemodynamically significant valvular heart disease. No  evidence of cardiac source of embolism. Bubble study negative for  intraatrial shunt. Scant late bubble seen, may be  due to extracardiac shunt.   CTA Head and Neck 05/05/20 CT HEAD IMPRESSION: 1. Evolving acute right PICA territory infarct, stable from previous MRI. No associated hemorrhage or significant regional mass effect. 2. Otherwise negative head CT. No other acute intracranial abnormality.   CTA HEAD AND NECK IMPRESSION: 1. Occlusion of the right vertebral artery at its origin, suspected to be acute in nature given the acute right PICA territory infarct, possibly related to dissection. Distal reconstitution of the right V4 segment via retrograde filling across the vertebrobasilar junction. The proximal right PICA is  perfused. 2. Otherwise negative CTA of the head and neck. No other large vessel occlusion, hemodynamically significant stenosis, or other acute vascular abnormality.  Echo 04/12/20  1. Left ventricular ejection fraction, by estimation, is 60 to 65%. The  left ventricle has normal function. The left ventricle has no regional  wall motion abnormalities. Left ventricular diastolic parameters were  normal.   2. Right ventricular systolic function is normal. The right ventricular  size is normal. Tricuspid regurgitation signal is inadequate for assessing  PA pressure.   3. The mitral valve is normal in structure. No evidence of mitral valve  regurgitation.   4. The aortic valve was not well visualized. Aortic valve regurgitation  is not visualized.   5. The inferior vena cava is normal in size with greater than 50%  respiratory variability, suggesting right atrial pressure of 3 mmHg.  Conclusion(s)/Recommendation(s): Normal biventricular function without  evidence of hemodynamically significant valvular heart disease.   EKG:  EKG was not ordered today  Recent Labs: 05/04/2020: TSH 1.75 05/05/2020: ALT 38; Hemoglobin 13.3; Platelets 353 05/18/2020: BUN 9; Creat 0.74; Potassium 3.7; Sodium 141   Recent Lipid Panel    Component Value Date/Time   CHOL 188 05/06/2020 0516   TRIG 134 05/06/2020 0516   HDL 22 (L) 05/06/2020 0516   CHOLHDL 8.5 05/06/2020 0516   VLDL 27 05/06/2020 0516   LDLCALC 139 (H) 05/06/2020 0516   LDLDIRECT 148.0 03/26/2020 1126     Physical Exam:    VS:  BP (!) 154/92 (BP Location: Right Arm, Patient Position: Sitting, Cuff Size: Normal)   Pulse 88   Ht 5' 1"  (1.549 m)   Wt 178 lb 8 oz (81 kg)   SpO2 97%   BMI 33.73 kg/m  , BMI Body mass index is 33.73 kg/m. GENERAL:  Well appearing HEENT: Pupils equal round and reactive, fundi not visualized, oral mucosa unremarkable NECK:  No jugular venous distention, waveform within normal limits, carotid upstroke brisk  and symmetric, no bruits, no thyromegaly LUNGS:  Clear to auscultation bilaterally HEART:  RRR.  PMI not displaced or sustained,S1 and S2 within normal limits, no S3, no S4, no clicks, no rubs, no murmurs ABD:  Flat, positive bowel sounds normal in frequency in pitch, no bruits, no rebound, no guarding, no midline pulsatile mass, no hepatomegaly, no splenomegaly EXT:  2 plus pulses throughout, no edema, no cyanosis no clubbing SKIN:  No rashes no nodules NEURO:  Cranial nerves II through XII grossly intact, motor grossly intact throughout PSYCH:  Cognitively intact, oriented to person place and time  ASSESSMENT:  1. Therapeutic drug monitoring   2. Resistant hypertension   3. Tobacco abuse   4. Obesity (BMI 30-39.9)   5. Vasculitis ([place])   6. Cerebrovascular accident (CVA), unspecified mechanism ([place])    PLAN:    Resistant hypertension Her blood pressure remains uncontrolled on more than 3 agents.  Therefore she has resistant hypertension.  She has been doing a good job of trying to increase her exercise and limiting her sodium intake.  Given that she developed hypertension at such a young age, we will check renal artery Dopplers to evaluate for renal artery stenosis.  We will try to simplify her regimen.  Switch amlodipine, losartan, and hydrochlorothiazide to Tribenzor (telmisartan 40/amlodipine 10/hydrochlorothiazide 25 mg).  Check a basic metabolic panel in a week.  If her blood pressure remains poorly controlled we will need to check for hyperaldosteronism.  Stop metoprolol.  She will continue to work on smoking cessation.  Tobacco abuse She continues to smoke about a quarter of a pack daily.  She is trying to work on cessation.  She is not interested in trying patches or Chantix again.  Hypokalemia Noted in the hospital.  Continue ARB and HCTZ for now.  Consider checking for hyperaldosteronism in the future.  Obesity (BMI 30-39.9) Continue working on diet and exercise.  We did  discuss the PREP program through the [facility].  She will keep working on diet and exercise on her own.  Stroke [facility]) She has a history of stroke presumably from vertebral dissection.  She stopped taking aspirin because something was upsetting her stomach.  She is unsure if it is this or the losartan.  She is willing to start back taking 81 mg daily.  If she does not tolerate it then she should be on clopidogrel 75 mg daily.  There is also some concern about vasculitis causing her stroke.  ESR, CRP, and anticardiolipin antibodies were all abnormal.  She does report a history of lupus diagnosed in her scalp.  We will refer her to rheumatology to better assess.  In order of problems listed above:      Medication Adjustments/Labs and Tests Ordered: Current medicines are reviewed at length with the patient today.  Concerns regarding medicines are outlined above.  Orders Placed This Encounter  Procedures   Basic metabolic panel   Ambulatory referral to Rheumatology   VAS US RENAL ARTERY DUPLEX    Meds ordered this encounter  Medications   Olmesartan-amLODIPine-HCTZ (TRIBENZOR) 40-10-25 MG TABS    Sig: Take 1 tablet by mouth daily.    Dispense:  90 tablet    Refill:  1    D/C LOSARTAN - HCTZ, METOPROLOL,  AND AMLODIPINE    Disposition: FU with APP in 1 month FU with Verlisa Vara C. Oval Linsey, MD, [facility] in [place] as a scribe for Skeet Latch, MD.,have documented all relevant documentation on the behalf of Skeet Latch, MD,as directed by  Skeet Latch, MD while in the presence of Skeet Latch, MD.  I, Ripley Oval Linsey, MD have reviewed all documentation for this visit.  The documentation of the exam, diagnosis, procedures, and orders on 03/13/2021 are all accurate and complete.   Signed, Skeet Latch, MD  03/13/2021 5:16 PM    [place]

## 2021-03-13 ENCOUNTER — Encounter (HOSPITAL_BASED_OUTPATIENT_CLINIC_OR_DEPARTMENT_OTHER): Payer: Self-pay | Admitting: Cardiovascular Disease

## 2021-03-13 ENCOUNTER — Ambulatory Visit (HOSPITAL_BASED_OUTPATIENT_CLINIC_OR_DEPARTMENT_OTHER): Payer: 59 | Admitting: Cardiovascular Disease

## 2021-03-13 ENCOUNTER — Other Ambulatory Visit (HOSPITAL_BASED_OUTPATIENT_CLINIC_OR_DEPARTMENT_OTHER): Payer: Self-pay | Admitting: *Deleted

## 2021-03-13 ENCOUNTER — Other Ambulatory Visit: Payer: Self-pay

## 2021-03-13 ENCOUNTER — Other Ambulatory Visit (HOSPITAL_COMMUNITY): Payer: Self-pay

## 2021-03-13 VITALS — BP 154/92 | HR 88 | Ht 61.0 in | Wt 178.5 lb

## 2021-03-13 DIAGNOSIS — I639 Cerebral infarction, unspecified: Secondary | ICD-10-CM | POA: Diagnosis not present

## 2021-03-13 DIAGNOSIS — I776 Arteritis, unspecified: Secondary | ICD-10-CM

## 2021-03-13 DIAGNOSIS — I10 Essential (primary) hypertension: Secondary | ICD-10-CM | POA: Diagnosis not present

## 2021-03-13 DIAGNOSIS — Z5181 Encounter for therapeutic drug level monitoring: Secondary | ICD-10-CM | POA: Diagnosis not present

## 2021-03-13 DIAGNOSIS — E669 Obesity, unspecified: Secondary | ICD-10-CM

## 2021-03-13 DIAGNOSIS — Z720 Tobacco use: Secondary | ICD-10-CM

## 2021-03-13 HISTORY — DX: Tobacco use: Z72.0

## 2021-03-13 MED ORDER — OLMESARTAN MEDOXOMIL-HCTZ 40-25 MG PO TABS
1.0000 | ORAL_TABLET | Freq: Every day | ORAL | 1 refills | Status: DC
  Filled 2021-03-13: qty 30, 30d supply, fill #0

## 2021-03-13 MED ORDER — OLMESARTAN-AMLODIPINE-HCTZ 40-10-25 MG PO TABS
1.0000 | ORAL_TABLET | Freq: Every day | ORAL | 1 refills | Status: DC
  Filled 2021-03-13: qty 90, 90d supply, fill #0

## 2021-03-13 MED ORDER — AMLODIPINE BESYLATE 10 MG PO TABS
10.0000 mg | ORAL_TABLET | Freq: Every day | ORAL | 3 refills | Status: AC
Start: 2021-03-13 — End: ?
  Filled 2021-03-13: qty 90, 90d supply, fill #0
  Filled 2021-06-28: qty 90, 90d supply, fill #1

## 2021-03-13 NOTE — Assessment & Plan Note (Signed)
Her blood pressure remains uncontrolled on more than 3 agents.  Therefore she has resistant hypertension.  She has been doing a good job of trying to increase her exercise and limiting her sodium intake.  Given that she developed hypertension at such a young age, we will check renal artery Dopplers to evaluate for renal artery stenosis.  We will try to simplify her regimen.  Switch amlodipine, losartan, and hydrochlorothiazide to Tribenzor (telmisartan 40/amlodipine 10/hydrochlorothiazide 25 mg).  Check a basic metabolic panel in a week.  If her blood pressure remains poorly controlled we will need to check for hyperaldosteronism.  Stop metoprolol.  She will continue to work on smoking cessation.

## 2021-03-13 NOTE — Assessment & Plan Note (Signed)
She continues to smoke about a quarter of a pack daily.  She is trying to work on cessation.  She is not interested in trying patches or Chantix again.

## 2021-03-13 NOTE — Assessment & Plan Note (Signed)
Noted in the hospital.  Continue ARB and HCTZ for now.  Consider checking for hyperaldosteronism in the future.

## 2021-03-13 NOTE — Assessment & Plan Note (Signed)
She has a history of stroke presumably from vertebral dissection.  She stopped taking aspirin because something was upsetting her stomach.  She is unsure if it is this or the losartan.  She is willing to start back taking 81 mg daily.  If she does not tolerate it then she should be on clopidogrel 75 mg daily.  There is also some concern about vasculitis causing her stroke.  ESR, CRP, and anticardiolipin antibodies were all abnormal.  She does report a history of lupus diagnosed in her scalp.  We will refer her to rheumatology to better assess.

## 2021-03-13 NOTE — Assessment & Plan Note (Signed)
Continue working on diet and exercise.  We did discuss the PREP program through the [facility].  She will keep working on diet and exercise on her own.

## 2021-03-13 NOTE — Telephone Encounter (Signed)
Received message from patients pharmacy Tribenzor not covered by insurance Sent Rx separately as requested

## 2021-03-13 NOTE — Patient Instructions (Addendum)
Medication Instructions:  STOP AMLODIPINE, LOSARTAN - HCT, AND METOPROLOL   START OLMESARTAN-AMLODIPINE-HCTZ 40-10-25 MG DAILY   TAKE ASPIRIN 81 MG DAILY    Labwork: BMET IN 1 WEEK    Testing/Procedures: Your physician has requested that you have a renal artery duplex. During this test, an ultrasound is used to evaluate blood flow to the kidneys. Allow one hour for this exam. Do not eat after midnight the day before and avoid carbonated beverages. Take your medications as you usually do.  Follow-Up: 04/18/2021 3:30 PM WITH PHARM D   You have been referred to RHEUMATOLOGY IF YOU DO NOT HEAR FROM THEM IN 2 WEEKS CALL THE OFFICE TO FOLLOW UP     Special Instructions:   MONITOR YOUR BLOOD PRESSURE TWICE A DAY, LOG IN THE BOOK PROVIDED. BRING THE BOOK AND YOUR BLOOD PRESSURE MACHINE TO YOUR FOLLOW UP IN 1 MONTH   DASH Eating Plan DASH stands for "Dietary Approaches to Stop Hypertension." The DASH eating plan is a healthy eating plan that has been shown to reduce high blood pressure (hypertension). It may also reduce your risk for type 2 diabetes, heart disease, and stroke. The DASH eating plan may also help with weight loss. What are tips for following this plan?  General guidelines Avoid eating more than 2,300 mg (milligrams) of salt (sodium) a day. If you have hypertension, you may need to reduce your sodium intake to 1,500 mg a day. Limit alcohol intake to no more than 1 drink a day for nonpregnant women and 2 drinks a day for men. One drink equals 12 oz of beer, 5 oz of wine, or 1 oz of hard liquor. Work with your health care provider to maintain a healthy body weight or to lose weight. Ask what an ideal weight is for you. Get at least 30 minutes of exercise that causes your heart to beat faster (aerobic exercise) most days of the week. Activities may include walking, swimming, or biking. Work with your health care provider or diet and nutrition specialist (dietitian) to adjust your  eating plan to your individual calorie needs. Reading food labels  Check food labels for the amount of sodium per serving. Choose foods with less than 5 percent of the Daily Value of sodium. Generally, foods with less than 300 mg of sodium per serving fit into this eating plan. To find whole grains, look for the word "whole" as the first word in the ingredient list. Shopping Buy products labeled as "low-sodium" or "no salt added." Buy fresh foods. Avoid canned foods and premade or frozen meals. Cooking Avoid adding salt when cooking. Use salt-free seasonings or herbs instead of table salt or sea salt. Check with your health care provider or pharmacist before using salt substitutes. Do not fry foods. Cook foods using healthy methods such as baking, boiling, grilling, and broiling instead. Cook with heart-healthy oils, such as olive, canola, soybean, or sunflower oil. Meal planning Eat a balanced diet that includes: 5 or more servings of fruits and vegetables each day. At each meal, try to fill half of your plate with fruits and vegetables. Up to 6-8 servings of whole grains each day. Less than 6 oz of lean meat, poultry, or fish each day. A 3-oz serving of meat is about the same size as a deck of cards. One egg equals 1 oz. 2 servings of low-fat dairy each day. A serving of nuts, seeds, or beans 5 times each week. Heart-healthy fats. Healthy fats called Omega-3 fatty acids  are found in foods such as flaxseeds and coldwater fish, like sardines, salmon, and mackerel. Limit how much you eat of the following: Canned or prepackaged foods. Food that is high in trans fat, such as fried foods. Food that is high in saturated fat, such as fatty meat. Sweets, desserts, sugary drinks, and other foods with added sugar. Full-fat dairy products. Do not salt foods before eating. Try to eat at least 2 vegetarian meals each week. Eat more home-cooked food and less restaurant, buffet, and fast food. When  eating at a restaurant, ask that your food be prepared with less salt or no salt, if possible. What foods are recommended? The items listed may not be a complete list. Talk with your dietitian about what dietary choices are best for you. Grains Whole-grain or whole-wheat bread. Whole-grain or whole-wheat pasta. Brown rice. [place]row. Bulgur. Whole-grain and low-sodium cereals. Pita bread. Low-fat, low-sodium crackers. Whole-wheat flour tortillas. Vegetables Fresh or frozen vegetables (raw, steamed, roasted, or grilled). Low-sodium or reduced-sodium tomato and vegetable juice. Low-sodium or reduced-sodium tomato sauce and tomato paste. Low-sodium or reduced-sodium canned vegetables. Fruits All fresh, dried, or frozen fruit. Canned fruit in natural juice (without added sugar). Meat and other protein foods Skinless chicken or [place]. Ground chicken or [place]. Pork with fat trimmed off. Fish and seafood. Egg whites. Dried beans, peas, or lentils. Unsalted nuts, nut butters, and seeds. Unsalted canned beans. Lean cuts of beef with fat trimmed off. Low-sodium, lean deli meat. Dairy Low-fat (1%) or fat-free (skim) milk. Fat-free, low-fat, or reduced-fat cheeses. Nonfat, low-sodium ricotta or cottage cheese. Low-fat or nonfat yogurt. Low-fat, low-sodium cheese. Fats and oils Soft margarine without trans fats. Vegetable oil. Low-fat, reduced-fat, or light mayonnaise and salad dressings (reduced-sodium). Canola, safflower, olive, soybean, and sunflower oils. Avocado. Seasoning and other foods Herbs. Spices. Seasoning mixes without salt. Unsalted popcorn and pretzels. Fat-free sweets. What foods are not recommended? The items listed may not be a complete list. Talk with your dietitian about what dietary choices are best for you. Grains Baked goods made with fat, such as croissants, muffins, or some breads. Dry pasta or rice meal packs. Vegetables Creamed or fried vegetables. Vegetables in a cheese  sauce. Regular canned vegetables (not low-sodium or reduced-sodium). Regular canned tomato sauce and paste (not low-sodium or reduced-sodium). Regular tomato and vegetable juice (not low-sodium or reduced-sodium). Angie Fava. Olives. Fruits Canned fruit in a light or heavy syrup. Fried fruit. Fruit in cream or butter sauce. Meat and other protein foods Fatty cuts of meat. Ribs. Fried meat. Berniece Salines. Sausage. Bologna and other processed lunch meats. Salami. Fatback. Hotdogs. Bratwurst. Salted nuts and seeds. Canned beans with added salt. Canned or smoked fish. Whole eggs or egg yolks. Chicken or [place] with skin. Dairy Whole or 2% milk, cream, and half-and-half. Whole or full-fat cream cheese. Whole-fat or sweetened yogurt. Full-fat cheese. Nondairy creamers. Whipped toppings. Processed cheese and cheese spreads. Fats and oils Butter. Stick margarine. Lard. Shortening. Ghee. Bacon fat. Tropical oils, such as coconut, palm kernel, or palm oil. Seasoning and other foods Salted popcorn and pretzels. Onion salt, garlic salt, seasoned salt, table salt, and sea salt. Worcestershire sauce. Tartar sauce. Barbecue sauce. Teriyaki sauce. Soy sauce, including reduced-sodium. Steak sauce. Canned and packaged gravies. Fish sauce. Oyster sauce. Cocktail sauce. Horseradish that you find on the shelf. Ketchup. Mustard. Meat flavorings and tenderizers. Bouillon cubes. Hot sauce and Tabasco sauce. Premade or packaged marinades. Premade or packaged taco seasonings. Relishes. Regular salad dressings. Where to find more information:  National Heart, Lung, and Blood Institute: https://wilson-eaton.com/ American Heart Association: www.heart.org Summary The DASH eating plan is a healthy eating plan that has been shown to reduce high blood pressure (hypertension). It may also reduce your risk for type 2 diabetes, heart disease, and stroke. With the DASH eating plan, you should limit salt (sodium) intake to 2,300 mg a day. If you have  hypertension, you may need to reduce your sodium intake to 1,500 mg a day. When on the DASH eating plan, aim to eat more fresh fruits and vegetables, whole grains, lean proteins, low-fat dairy, and heart-healthy fats. Work with your health care provider or diet and nutrition specialist (dietitian) to adjust your eating plan to your individual calorie needs. This information is not intended to replace advice given to you by your health care provider. Make sure you discuss any questions you have with your health care provider. Document Released: 03/13/2011 Document Revised: 03/06/2017 Document Reviewed: 03/17/2016 Elsevier Patient Education  2020 [employer].

## 2021-03-14 ENCOUNTER — Other Ambulatory Visit (HOSPITAL_COMMUNITY): Payer: Self-pay

## 2021-03-19 ENCOUNTER — Other Ambulatory Visit (HOSPITAL_COMMUNITY): Payer: Self-pay

## 2021-03-20 DIAGNOSIS — Z5181 Encounter for therapeutic drug level monitoring: Secondary | ICD-10-CM | POA: Diagnosis not present

## 2021-03-20 DIAGNOSIS — I10 Essential (primary) hypertension: Secondary | ICD-10-CM | POA: Diagnosis not present

## 2021-03-21 LAB — BASIC METABOLIC PANEL
BUN/Creatinine Ratio: 15 (ref 9–23)
BUN: 17 mg/dL (ref 6–20)
CO2: 21 mmol/L (ref 20–29)
Calcium: 10.1 mg/dL (ref 8.7–10.2)
Chloride: 93 mmol/L — ABNORMAL LOW (ref 96–106)
Creatinine, Ser: 1.11 mg/dL — ABNORMAL HIGH (ref 0.57–1.00)
Glucose: 138 mg/dL — ABNORMAL HIGH (ref 70–99)
Potassium: 3.2 mmol/L — ABNORMAL LOW (ref 3.5–5.2)
Sodium: 137 mmol/L (ref 134–144)
eGFR: 65 mL/min/{1.73_m2} (ref >59)

## 2021-03-28 ENCOUNTER — Other Ambulatory Visit: Payer: Self-pay

## 2021-03-28 ENCOUNTER — Ambulatory Visit (HOSPITAL_COMMUNITY)
Admission: RE | Admit: 2021-03-28 | Discharge: 2021-03-28 | Disposition: A | Discharge status: Home / Self Care | Payer: 59 | Source: Ambulatory Visit | Attending: Cardiovascular Disease | Admitting: Cardiovascular Disease

## 2021-03-28 DIAGNOSIS — I10 Essential (primary) hypertension: Secondary | ICD-10-CM | POA: Diagnosis not present

## 2021-04-17 ENCOUNTER — Ambulatory Visit: Payer: 59

## 2021-04-18 ENCOUNTER — Encounter: Payer: Self-pay | Admitting: Pharmacist Clinician (PhC)/ Clinical Pharmacy Specialist

## 2021-04-18 ENCOUNTER — Other Ambulatory Visit: Payer: Self-pay

## 2021-04-18 ENCOUNTER — Other Ambulatory Visit (HOSPITAL_COMMUNITY): Payer: Self-pay

## 2021-04-18 ENCOUNTER — Ambulatory Visit: Payer: 59 | Admitting: Pharmacist Clinician (PhC)/ Clinical Pharmacy Specialist

## 2021-04-18 VITALS — BP 156/88 | HR 83 | Resp 14 | Ht 61.25 in | Wt 178.4 lb

## 2021-04-18 DIAGNOSIS — I10 Essential (primary) hypertension: Secondary | ICD-10-CM

## 2021-04-18 MED ORDER — SPIRONOLACTONE 25 MG PO TABS
25.0000 mg | ORAL_TABLET | Freq: Every day | ORAL | 3 refills | Status: AC
Start: 2021-04-18 — End: 2021-08-03
  Filled 2021-04-18: qty 30, 30d supply, fill #0
  Filled 2021-06-02: qty 30, 30d supply, fill #1
  Filled 2021-06-28: qty 30, 30d supply, fill #2

## 2021-04-18 NOTE — Patient Instructions (Signed)
Return for a a follow up appointment Thursday February 9 at 3:30 pm  Go to the lab tomorrow to check renin/aldosterone labs - go first thing in the morning  Take your BP meds as follows:  After getting labs, then start spironolactone 25 mg once daily  Continue amlodipine.   Bring all of your meds, your BP cuff and your record of home blood pressures to your next appointment.  Exercise as youre able, try to walk approximately 30 minutes per day.  Keep salt intake to a minimum, especially watch canned and prepared boxed foods.  Eat more fresh fruits and vegetables and fewer canned items.  Avoid eating in fast food restaurants.    HOW TO TAKE YOUR BLOOD PRESSURE: Rest 5 minutes before taking your blood pressure.  Dont smoke or drink caffeinated beverages for at least 30 minutes before. Take your blood pressure before (not after) you eat. Sit comfortably with your back supported and both feet on the floor (dont cross your legs). Elevate your arm to heart level on a table or a desk. Use the proper sized cuff. It should fit smoothly and snugly around your bare upper arm. There should be enough room to slip a fingertip under the cuff. The bottom edge of the cuff should be 1 inch above the crease of the elbow. Ideally, take 3 measurements at one sitting and record the average.

## 2021-04-18 NOTE — Progress Notes (Signed)
04/18/2021 Marissa Crawford May 24, 1980 [id number]   HPI:  ELLASYN SWILLING is a 41 y.o. female patient of Dr Oval Linsey, with a Shindler below who presents today for hypertension clinic evaluation.  She was seen by Dr. Oval Linsey last month, at which time her pressure was elevated to 154/92.  Patient reported that her blood pressure increased some when she was pregnant, but not significantly.  Only after her daughter was born did the pressure continue to rise.  She had previously tried losartan, but it caused indigestion, nausea and vomiting.  Dr. Oval Linsey had her try olmesartan instead and unfortunately this caused the same problem.  She was able to take it for a couple of weeks, and her systolic pressure did come down to WNL, however she had to discontinue due to vomiting.  Since then she has been only on the amlodipine 10 mg daily.    Today she is here for follow up.  She is a Marine scientist for [employer], focusing on renal issues.  She does not want to challenge with another ARB and is hesitant to try an ACEI at this time.   Past Medical History: hyperlipidemia 1/22 - LDL 139, no meds, no ASCVD  CVA   obesity Working on lifestyle modifications, hoping to lose about 20 more pounds  Tobacco abuse 20 year smoker - tried patches/chantix, down to 1/2 ppd currently     Blood Pressure Goal:  130/80  Current Medications: amlodipine 10 mg daily  Family Hx: father with hypertension - controlled; mother died when pt 50 months- cancer; - knows that maternal history does include DM, stroke; 4 full siblings, none with hypertension  Social Hx: currently smoking 1/2 ppd; social alcohol; no regular caffeine  Diet: mostly home cooked meals, trying to get more veggies - only likes 4-5; eats lot shrimp, only pork is bacon, no beef; cut out soda and now just sparkling water, no snacks, no added salt at table, although does add some with cooking  Exercise: 30 min daily after work on treadmill/elliptical; quit using elevator at work -  so far today is up to 26 flights of stairs and 11,000 steps per watch  Home BP readings: has a few readings from week after starting olmesartan - systolic readings were 627-035, diastolic still mid-high 00'X  Intolerances: losartan and olmesartan both caused indigestion, N/V  Labs: 12/22 (drawn day after last dose of olmesartan - had vomiting night prior to draw)   Na 137, K 3.2, Glu 138, BUN 17, SCr 1.11 GFR 65  Previous SCr was 0.74 - unsure if increase was due to dehydration or effect of ARB or both   Wt Readings from Last 3 Encounters:  03/13/21 178 lb 8 oz (81 kg)  01/29/21 182 lb 6.4 oz (82.7 kg)  05/18/20 182 lb (82.6 kg)   BP Readings from Last 3 Encounters:  03/13/21 (!) 154/92  01/29/21 (!) 190/98  05/18/20 (!) 150/100   Pulse Readings from Last 3 Encounters:  03/13/21 88  01/29/21 74  05/18/20 87    Current Outpatient Medications  Medication Sig Dispense Refill   amLODipine (NORVASC) 10 MG tablet Take 1 tablet (10 mg total) by mouth daily. 90 tablet 3   aspirin EC 81 MG tablet Take 81 mg by mouth daily. Swallow whole.     Blood Pressure Monitoring (OMRON 3 SERIES BP MONITOR) DEVI use as directed 1 each 0   norethindrone (CAMILA) 0.35 MG tablet Take 1 tablet (0.35 mg total) by mouth daily.  84 tablet 3   olmesartan-hydrochlorothiazide (BENICAR HCT) 40-25 MG tablet Take 1 tablet by mouth daily. 90 tablet 1   No current facility-administered medications for this visit.    No Known Allergies  Past Medical History:  Diagnosis Date   Acute CVA (cerebrovascular accident) ([place]) 05/05/2020   AKI (acute kidney injury) ([place]) 05/05/2020   HTN (hypertension)    Resistant hypertension 12/23/2016   Tobacco abuse 03/13/2021    There were no vitals taken for this visit.  No problem-specific Assessment & Plan notes found for this encounter.   Tommy Medal PharmD CPP [facility] [address] [place], [place]  [address] [phone]

## 2021-04-18 NOTE — Assessment & Plan Note (Signed)
Patient with possible resistant hypertension, currently only tolerating amlodipine.  Will draw renin/aldosterone labs tomorrow morning, since she is currently not on any interfering medications.  Cannot give chlorthalidone today because of low potassium level last month and would like to start spironolactone.  Will have her take first dose after getting labs drawn.  She will need to repeat a metabolic panel in 2 weeks and we will see her back in 4 weeks for follow up.

## 2021-04-19 DIAGNOSIS — I10 Essential (primary) hypertension: Secondary | ICD-10-CM | POA: Diagnosis not present

## 2021-04-19 LAB — BASIC METABOLIC PANEL
BUN/Creatinine Ratio: 13 (ref 9–23)
BUN: 9 mg/dL (ref 6–20)
CO2: 19 mmol/L — ABNORMAL LOW (ref 20–29)
Calcium: 9.5 mg/dL (ref 8.7–10.2)
Chloride: 100 mmol/L (ref 96–106)
Creatinine, Ser: 0.71 mg/dL (ref 0.57–1.00)
Glucose: 98 mg/dL (ref 70–99)
Potassium: 3.6 mmol/L (ref 3.5–5.2)
Sodium: 138 mmol/L (ref 134–144)
eGFR: 111 mL/min/{1.73_m2} (ref >59)

## 2021-04-29 LAB — ALDOSTERONE + RENIN ACTIVITY W/ RATIO
ALDOS/RENIN RATIO: <2.3 (ref 0.0–30.0)
ALDOSTERONE: <1 ng/dL (ref 0.0–30.0)
Renin: 0.428 ng/mL/hr (ref 0.167–5.380)

## 2021-05-10 ENCOUNTER — Ambulatory Visit: Payer: 59 | Admitting: Internal Medicine

## 2021-05-16 ENCOUNTER — Other Ambulatory Visit: Payer: Self-pay

## 2021-05-16 ENCOUNTER — Other Ambulatory Visit (HOSPITAL_COMMUNITY): Payer: Self-pay

## 2021-05-16 ENCOUNTER — Encounter: Payer: Self-pay | Admitting: Pharmacist

## 2021-05-16 ENCOUNTER — Ambulatory Visit: Payer: 59 | Admitting: Pharmacist

## 2021-05-16 VITALS — BP 153/91 | HR 77 | Resp 14 | Ht 61.25 in | Wt 174.0 lb

## 2021-05-16 DIAGNOSIS — I10 Essential (primary) hypertension: Secondary | ICD-10-CM

## 2021-05-16 MED ORDER — HYDROCHLOROTHIAZIDE 12.5 MG PO CAPS
12.5000 mg | ORAL_CAPSULE | Freq: Every day | ORAL | 0 refills | Status: AC
  Filled 2021-05-16: qty 30, 30d supply, fill #0

## 2021-05-16 NOTE — Patient Instructions (Addendum)
°  It was nice meeting you today  We would like your blood pressure to be less than 130/80  Please continue your spironolactone and amlodipine  We will add on a new medication called hydrochlorothiazide 12.5mg  daily   We will recheck your lab work in about 1 week  Please call or message with any questions  Karren Cobble, PharmD, [place], [place], [place], [place], [place], [address] Phone: [phone], Fax: [phone]

## 2021-05-16 NOTE — Progress Notes (Signed)
Patient ID: WEDA BAUMGARNER                 DOB: Dec 29, 1980                      MRN: [id number]     HPI: Marissa Crawford is a 41 y.o. female referred by Dr. Oval Linsey to HTN clinic. PMH is significant for HLD, CVA, resistant hypertension and tobacco abuse.  At last visit with Pharmd patient was started on spironolactone.  Patient presents today in good spirits.  Has lost 4#.  Is walking more at work and doing extra stairs. However blood pressure remains elevated. Has been checking BP at home regularly since starting spironolactone and blood pressure remains >150/90.  However K normalized.    Patient has intolerances to ARBs.  Has been hesitant to start ACEi due to risk of angioedema. Patient is a renal nurse.  Current HTN meds:  Amlodipine 10mg  daily Spironolactone 25mg  daily  Previously tried: Olmesartan (indigestion) Losartan (indigestion)  BP goal: <130/80  Wt Readings from Last 3 Encounters:  05/16/21 174 lb (78.9 kg)  04/18/21 178 lb 6.4 oz (80.9 kg)  03/13/21 178 lb 8 oz (81 kg)   BP Readings from Last 3 Encounters:  05/16/21 (!) 153/91  04/18/21 (!) 156/88  03/13/21 (!) 154/92   Pulse Readings from Last 3 Encounters:  05/16/21 77  04/18/21 83  03/13/21 88    Renal function: CrCl cannot be calculated (Patient's most recent lab result is older than the maximum 21 days allowed.).  Past Medical History:  Diagnosis Date   Acute CVA (cerebrovascular accident) ([place]) 05/05/2020   AKI (acute kidney injury) ([place]) 05/05/2020   HTN (hypertension)    Resistant hypertension 12/23/2016   Tobacco abuse 03/13/2021    Current Outpatient Medications on File Prior to Visit  Medication Sig Dispense Refill   amLODipine (NORVASC) 10 MG tablet Take 1 tablet (10 mg total) by mouth daily. 90 tablet 3   aspirin EC 81 MG tablet Take 81 mg by mouth daily. Swallow whole.     Blood Pressure Monitoring (OMRON 3 SERIES BP MONITOR) DEVI use as directed 1 each 0   norethindrone (CAMILA) 0.35 MG tablet  Take 1 tablet (0.35 mg total) by mouth daily. 84 tablet 3   spironolactone (ALDACTONE) 25 MG tablet Take 1 tablet (25 mg total) by mouth daily. 30 tablet 3   No current facility-administered medications on file prior to visit.    Allergies  Allergen Reactions   Bee Venom    Olmesartan     indigestion   Yellow Jacket Venom Rash and Swelling     Assessment/Plan:  1. Hypertension -  Patient BP in room 153/91 which is above goal of <130/80 and virtually unchanged from last visit.  Congratulated on weight loss and exercise plan.  Discussed next options with patient including ACEi, thiazides, or hydralazine.  Thiazides previously not an option due to hypokalemia, however K now normal on spironolactone.  Patient would like to try low dose HCTZ.  Will start 12.5mg  and check Bmp in 1 week.  Continue amlodipine 10mg  daily Continue spironolactone 25mg  daily Start HCTZ 12.5mg  daily Check BMP in 1 week  Marissa Crawford, PharmD, [place], [place], [place], [place], [place], [address] Phone: [phone], Fax: [phone]

## 2021-05-17 ENCOUNTER — Telehealth (HOSPITAL_BASED_OUTPATIENT_CLINIC_OR_DEPARTMENT_OTHER): Payer: Self-pay | Admitting: *Deleted

## 2021-05-17 NOTE — Telephone Encounter (Signed)
-----   Message from Skeet Latch, MD sent at 05/10/2021  5:45 PM EST ----- She can do an MRA which won't affect her kidneys. There is contrast but it doesn't affect her kidneys.   ----- Message ----- From: Earvin Hansen, LPN Sent: 08/05/256  52:77 AM EST To: Skeet Latch, MD  Spoke with patient and she would prefer NOT having contrast especially since her kidneys are functioning well. Will forward to Dr Oval Linsey for review

## 2021-05-17 NOTE — Telephone Encounter (Signed)
Advised patient She is going to think about it an call back next week

## 2021-05-23 ENCOUNTER — Encounter: Payer: Self-pay | Admitting: Pharmacist

## 2021-05-27 DIAGNOSIS — I10 Essential (primary) hypertension: Secondary | ICD-10-CM | POA: Diagnosis not present

## 2021-05-28 LAB — BASIC METABOLIC PANEL
BUN/Creatinine Ratio: 15 (ref 9–23)
BUN: 13 mg/dL (ref 6–20)
CO2: 22 mmol/L (ref 20–29)
Calcium: 10.4 mg/dL — ABNORMAL HIGH (ref 8.7–10.2)
Chloride: 98 mmol/L (ref 96–106)
Creatinine, Ser: 0.88 mg/dL (ref 0.57–1.00)
Glucose: 124 mg/dL — ABNORMAL HIGH (ref 70–99)
Potassium: 4 mmol/L (ref 3.5–5.2)
Sodium: 138 mmol/L (ref 134–144)
eGFR: 86 mL/min/{1.73_m2} (ref >59)

## 2021-05-29 ENCOUNTER — Ambulatory Visit: Payer: 59 | Admitting: Internal Medicine

## 2021-06-03 ENCOUNTER — Other Ambulatory Visit (HOSPITAL_COMMUNITY): Payer: Self-pay

## 2021-06-11 DIAGNOSIS — Z202 Contact with and (suspected) exposure to infections with a predominantly sexual mode of transmission: Secondary | ICD-10-CM | POA: Diagnosis not present

## 2021-06-11 DIAGNOSIS — N3001 Acute cystitis with hematuria: Secondary | ICD-10-CM | POA: Diagnosis not present

## 2021-06-12 ENCOUNTER — Other Ambulatory Visit (HOSPITAL_COMMUNITY): Payer: Self-pay

## 2021-06-12 MED ORDER — NITROFURANTOIN MONOHYD MACRO 100 MG PO CAPS
100.0000 mg | ORAL_CAPSULE | Freq: Two times a day (BID) | ORAL | 0 refills | Status: AC
  Filled 2021-06-12: qty 10, 5d supply, fill #0

## 2021-06-28 ENCOUNTER — Other Ambulatory Visit (HOSPITAL_COMMUNITY): Payer: Self-pay

## 2021-07-25 ENCOUNTER — Encounter (HOSPITAL_BASED_OUTPATIENT_CLINIC_OR_DEPARTMENT_OTHER): Payer: Self-pay | Admitting: *Deleted

## 2021-08-09 ENCOUNTER — Encounter (HOSPITAL_BASED_OUTPATIENT_CLINIC_OR_DEPARTMENT_OTHER): Payer: Self-pay | Admitting: *Deleted

## 2022-01-20 IMAGING — MR MR HEAD W/O CM
10 of 11 series · 43 of 48 positions shown · non-contrast
Comparison: None.
COMPARISON: None.

Addendum:
CLINICAL DATA: Headache, dizziness, ataxia.

EXAM:
MRI HEAD WITHOUT CONTRAST
TECHNIQUE: Multiplanar, multiecho pulse sequences of the brain and surrounding
structures were obtained without intravenous contrast.

[Series 5: DWI · axial · 3.0mm · 0.88mm/px · z∈[-162,-17]mm · 10 of 99 slices shown (1 of 4)]
[im 1/99]
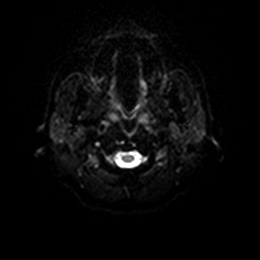
[im 11/99]
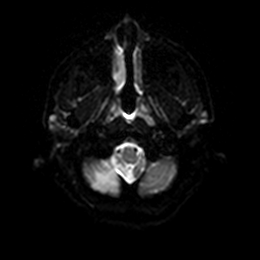
[im 22/99]
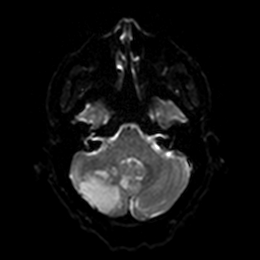
[im 33/99]
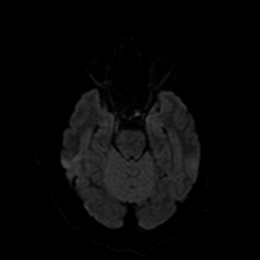
[im 44/99]
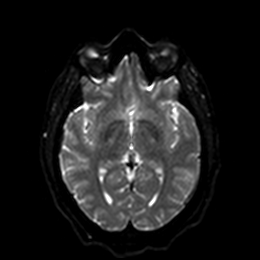
[im 55/99]
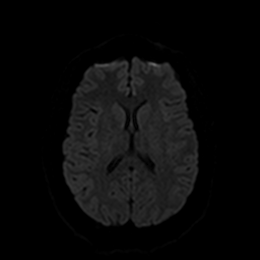
[im 66/99]
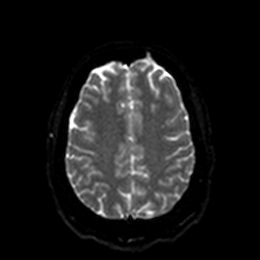
[im 77/99]
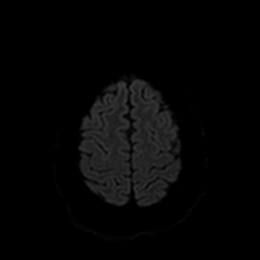
[im 88/99]
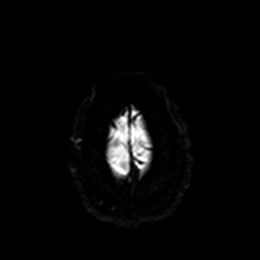
[im 99/99]
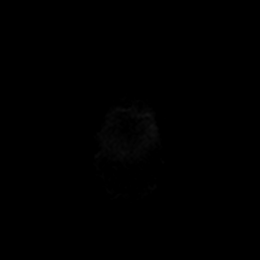

[Series 6: DWI · axial · 3.0mm · 0.88mm/px · z∈[-162,-17]mm · 5 of 50 slices shown (2 of 4)]
[im 1/50]
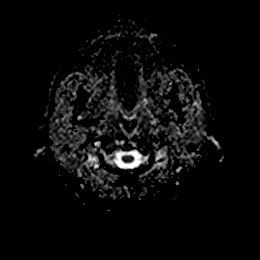
[im 13/50]
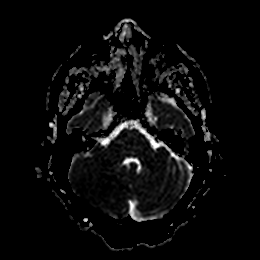
[im 25/50]
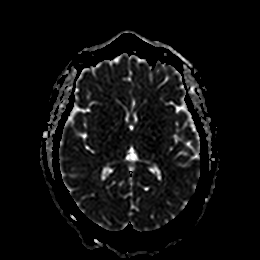
[im 37/50]
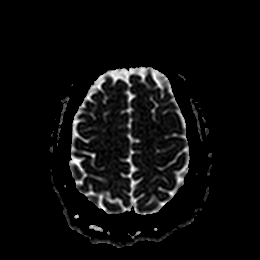
[im 50/50]
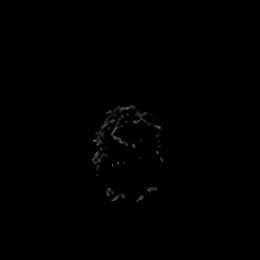

[Series 7: DWI · coronal · 4.0mm · 0.88mm/px · 6 of 68 slices shown (3 of 4)]
[im 1/68]
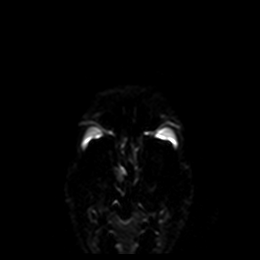
[im 14/68]
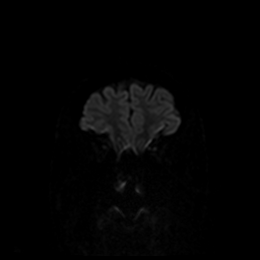
[im 27/68]
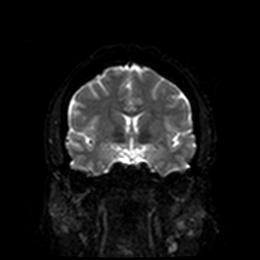
[im 41/68]
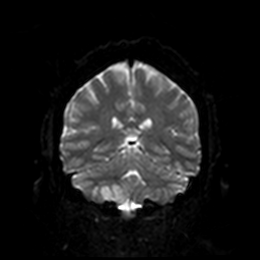
[im 54/68]
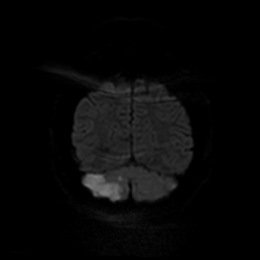
[im 68/68]
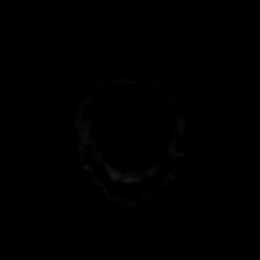

[Series 8: DWI · coronal · 4.0mm · 0.88mm/px · 3 of 34 slices shown (4 of 4)]
[im 1/34]
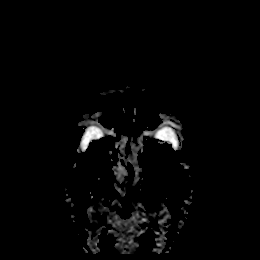
[im 17/34]
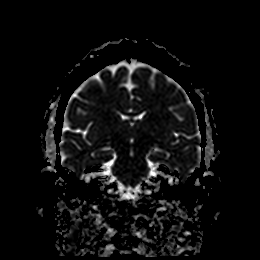
[im 34/34]
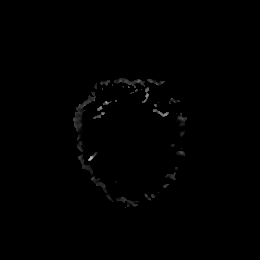

[Series 9: FLAIR · axial · 5.0mm · 0.45mm/px · z∈[-159,-17]mm · 2 of 25 slices shown]
[im 1/25]
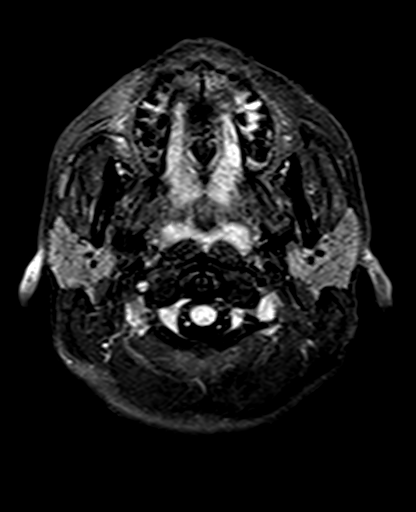
[im 25/25]
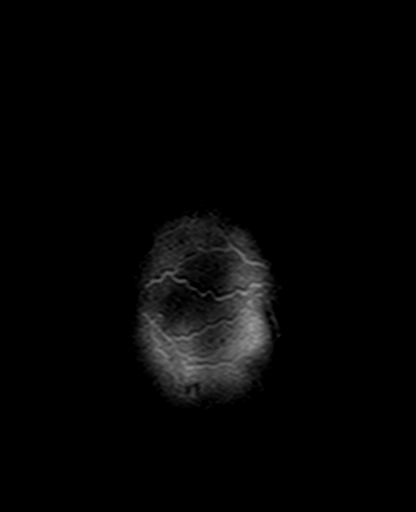

[Series 11: pha_images · axial · 3.0mm · 0.90mm/px · z∈[-163,-12]mm · 5 of 52 slices shown]
[im 1/52]
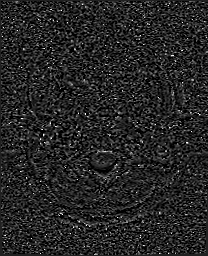
[im 13/52]
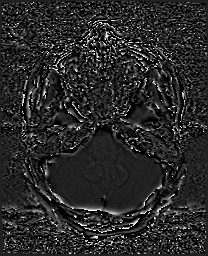
[im 26/52]
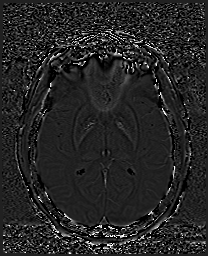
[im 39/52]
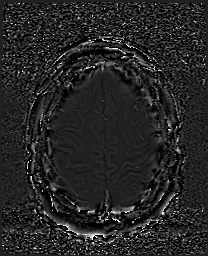
[im 52/52]
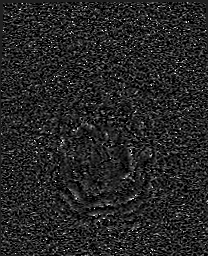

[Series 12: swi_images · axial · 3.0mm · 0.90mm/px · z∈[-163,-12]mm · 5 of 52 slices shown]
[im 1/52]
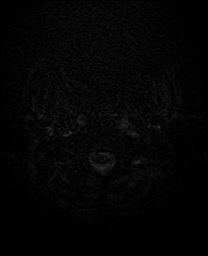
[im 13/52]
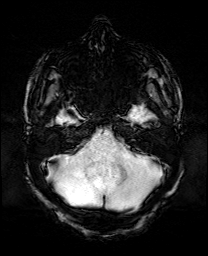
[im 26/52]
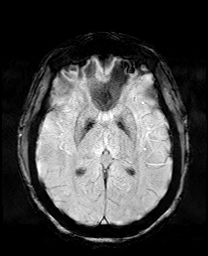
[im 39/52]
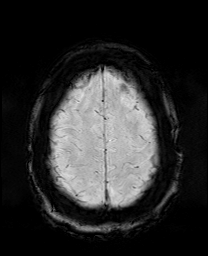
[im 52/52]
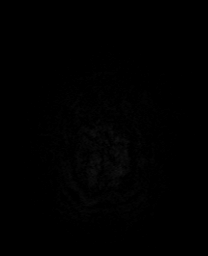

[Series 14: T1 · sagittal · 5.0mm · 0.75mm/px · 2 of 25 slices shown]
[im 1/25]
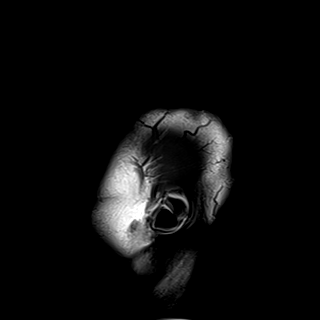
[im 25/25]
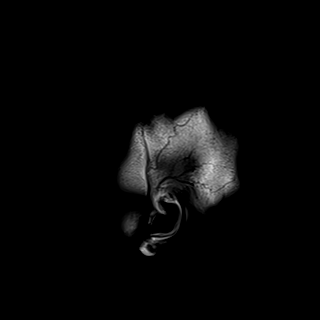

[Series 15: T2 · axial · 5.0mm · 0.72mm/px · z∈[-160,-18]mm · 2 of 25 slices shown (1 of 2)]
[im 1/25]
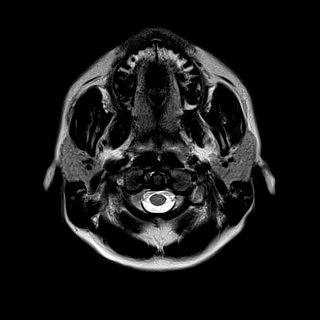
[im 25/25]
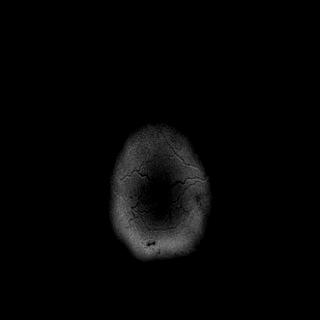

[Series 17: T2 · coronal · 5.0mm · 0.34mm/px · 3 of 29 slices shown (2 of 2)]
[im 1/29]
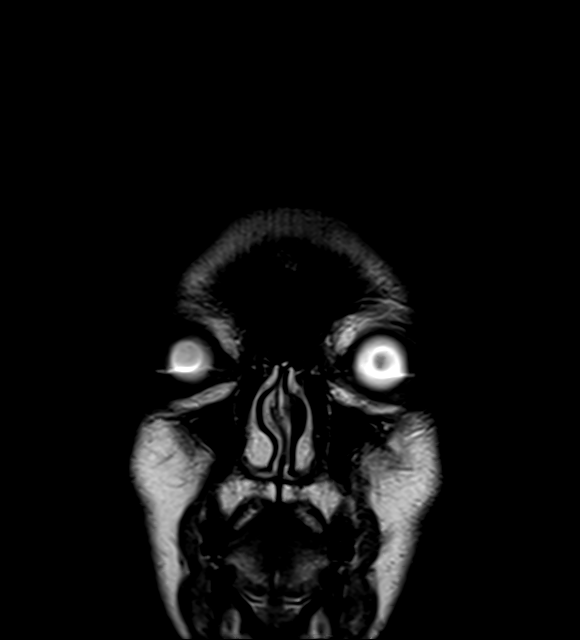
[im 15/29]
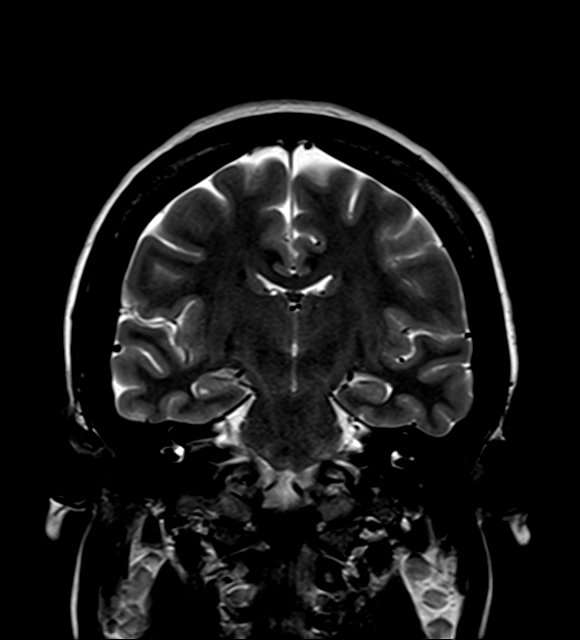
[im 29/29]
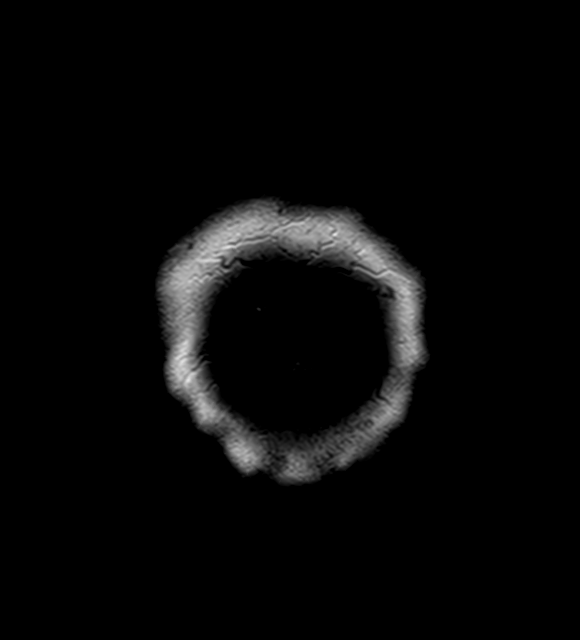

[43 of 48 positions shown; findings below may reference images not displayed]

FINDINGS: Brain: Acute infarct in the right PICA territory. Moderately large
cerebellar infarct is present without hemorrhage. No other acute
infarct. No evidence of chronic ischemia

Ventricle size normal.  Negative for hemorrhage or mass.

Vascular: Normal arterial flow voids.

Skull and upper cervical spine: Negative

Sinuses/Orbits: Paranasal sinuses clear.  Negative orbit

Other: None
IMPRESSION: Acute infarct right PICA territory. Negative for hemorrhage.
Otherwise negative. Recommend further vascular evaluation to rule
out vertebral dissection or embolic source.

ADDENDUM:
These results were called by telephone at the time of interpretation
on 05/05/2020 at [DATE] to provider Elyana , who verbally acknowledged
these results.

*** End of Addendum ***
FINDINGS: Brain: Acute infarct in the right PICA territory. Moderately large
cerebellar infarct is present without hemorrhage. No other acute
infarct. No evidence of chronic ischemia

Ventricle size normal.  Negative for hemorrhage or mass.

Vascular: Normal arterial flow voids.

Skull and upper cervical spine: Negative

Sinuses/Orbits: Paranasal sinuses clear.  Negative orbit

Other: None
IMPRESSION: Acute infarct right PICA territory. Negative for hemorrhage.
Otherwise negative. Recommend further vascular evaluation to rule
out vertebral dissection or embolic source.

## 2022-01-20 IMAGING — CT CT ANGIO HEAD
1 of 11 series · 5 of 33 positions shown · IV contrast (APPLIED)
Comparison: Prior MRI from earlier the same day.

CLINICAL DATA: Initial evaluation for acute stroke.

EXAM:
CT ANGIOGRAPHY HEAD AND NECK
TECHNIQUE: Multidetector CT imaging of the head and neck was performed using
the standard protocol during bolus administration of intravenous
contrast. Multiplanar CT image reconstructions and MIPs were
obtained to evaluate the vascular anatomy. Carotid stenosis
measurements (when applicable) are obtained utilizing NASCET
criteria, using the distal internal carotid diameter as the
denominator.
CONTRAST:  75mL OMNIPAQUE IOHEXOL 350 MG/ML SOLN

[Series 12: ax thins · axial · 0.39mm/px · z∈[-314,-100]mm · 5 of 322 slices shown]
[im 54/322  soft-tissue]
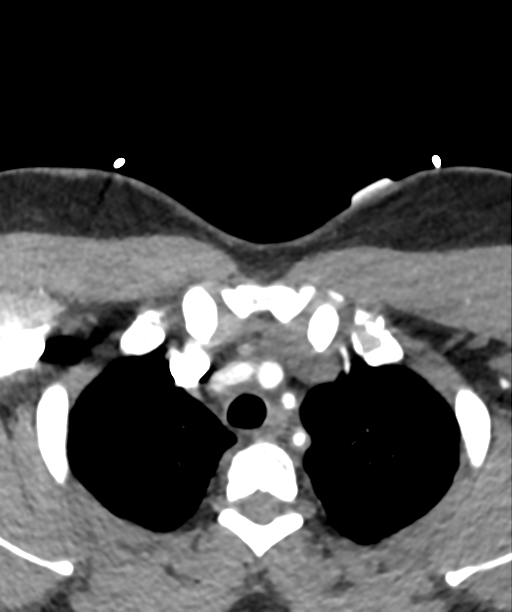
[im 108/322  bone]
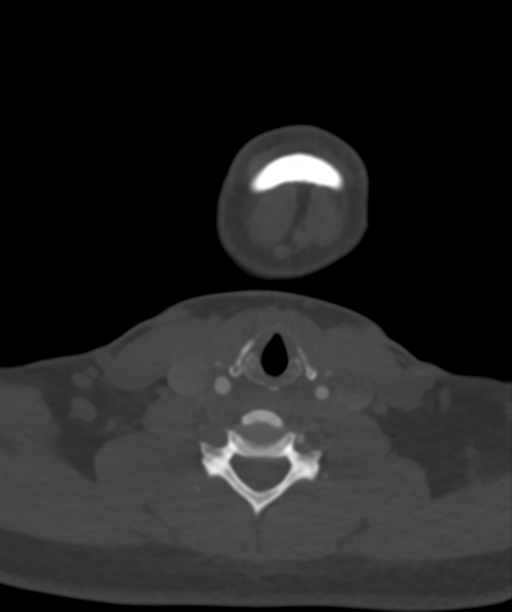
[im 161/322  soft-tissue]
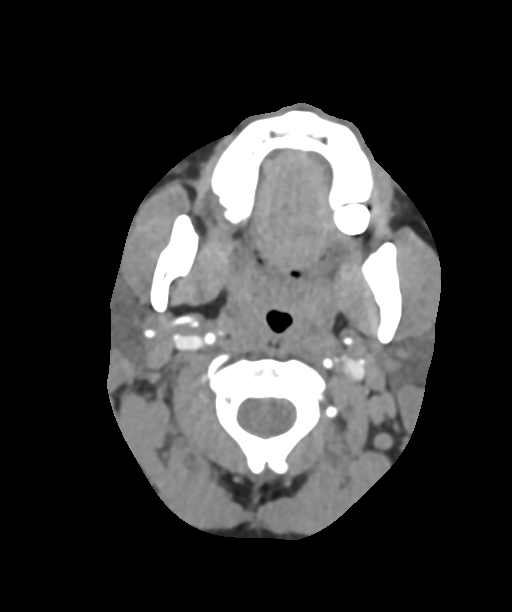
[im 215/322  bone]
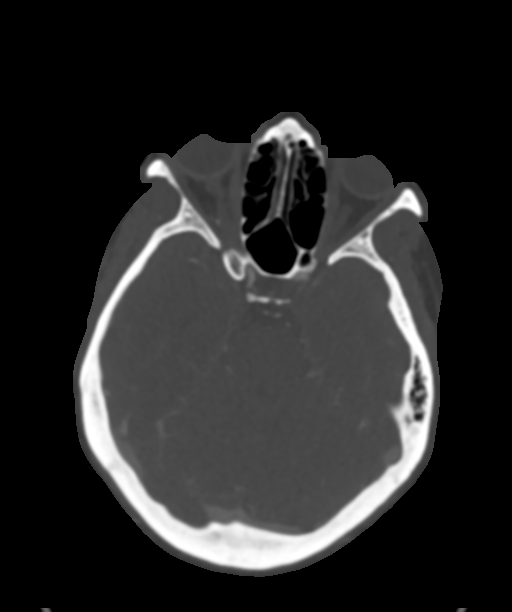
[im 268/322  soft-tissue]
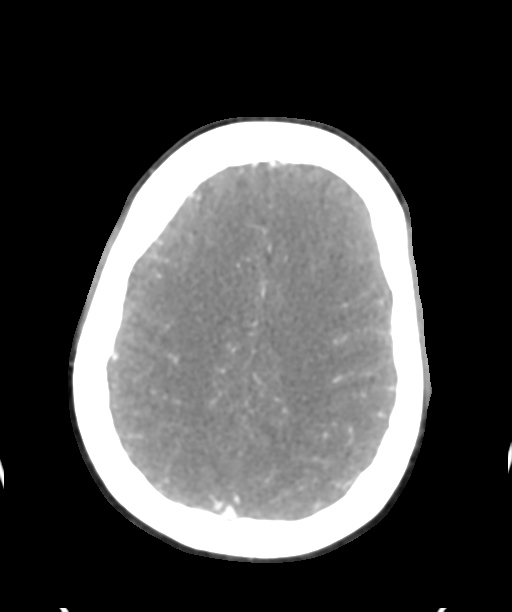

[5 of 33 positions shown; findings below may reference images not displayed]

FINDINGS: CT HEAD FINDINGS

Brain: Evolving right PICA territory infarct again seen, stable from
previous MRI. No associated hemorrhage or significant regional mass
effect.

Remainder the brain is normal in appearance. No other acute large
vessel territory infarct or hemorrhage. No mass lesion, midline
shift or significant mass effect. No extra-axial fluid collection.
No hydrocephalus.

Vascular: No hyperdense vessel.

Skull: Scalp soft tissues and calvarium within normal limits.

Sinuses: Paranasal sinuses are largely clear.  No mastoid effusion.

Orbits: Globes orbital soft tissues within normal limits.

Review of the MIP images confirms the above findings

CTA NECK FINDINGS

Aortic arch: Visualized aortic arch of normal caliber with normal
branch pattern. No hemodynamically significant stenosis seen about
the origin of the great vessels. Visualized subclavian arteries
widely patent.

Right carotid system: Right common and internal carotid arteries
patent without stenosis, dissection or occlusion. Mild eccentric
calcified plaque at the right bifurcation without significant
stenosis.

Left carotid system: Left common and internal carotid arteries
widely patent without stenosis, dissection or occlusion.

Vertebral arteries: Both vertebral arteries arise from the
subclavian arteries. Left vertebral artery widely patent within the
neck without stenosis, dissection or occlusion. Right vertebral
artery occluded at its origin, and remains occluded within the neck.

Skeleton: No visible acute osseous abnormality. No discrete or
worrisome osseous lesions.

Other neck: No other acute soft tissue abnormality within the neck.
No mass or adenopathy.

Upper chest: Visualized upper chest demonstrates no acute finding.

Review of the MIP images confirms the above findings

CTA HEAD FINDINGS

Anterior circulation: Petrous segments widely patent. Minimal
atheromatous plaque within the carotid siphons without significant
stenosis. A1 segments patent bilaterally. Normal anterior
communicating artery complex. Anterior cerebral arteries patent to
their distal aspects without stenosis. Normal in stenosis or
occlusion. Normal MCA bifurcations. Distal MCA branches well
perfused and symmetric.

Posterior circulation: Left vertebral artery widely patent to the
vertebrobasilar junction. Right vertebral artery occluded as it
courses into the cranial vault. Retrograde filling of the distal
right V4 segment via collateral flow across the vertebrobasilar
junction. Perfusion of the origin/proximal aspect of the right PICA.
Left PICA patent as well. Basilar patent to its distal aspect
without stenosis. Superior cerebellar arteries patent bilaterally.
Both PCA supplied via the basilar as well as small bilateral
posterior communicating arteries. Both PCAs well perfused to their
distal aspects.

Venous sinuses: Patent allowing for timing the contrast bolus.

Anatomic variants: None significant.

Review of the MIP images confirms the above findings
IMPRESSION: CT HEAD IMPRESSION:

1. Evolving acute right PICA territory infarct, stable from previous
MRI. No associated hemorrhage or significant regional mass effect.
2. Otherwise negative head CT. No other acute intracranial
abnormality.

CTA HEAD AND NECK IMPRESSION:

1. Occlusion of the right vertebral artery at its origin, suspected
to be acute in nature given the acute right PICA territory infarct,
possibly related to dissection. Distal reconstitution of the right
V4 segment via retrograde filling across the vertebrobasilar
junction. The proximal right PICA is perfused.
2. Otherwise negative CTA of the head and neck. No other large
vessel occlusion, hemodynamically significant stenosis, or other
acute vascular abnormality.

## 2024-05-10 ENCOUNTER — Other Ambulatory Visit: Payer: Self-pay | Admitting: Obstetrics and Gynecology

## 2024-05-10 DIAGNOSIS — R928 Other abnormal and inconclusive findings on diagnostic imaging of breast: Secondary | ICD-10-CM

## 2024-05-12 ENCOUNTER — Inpatient Hospital Stay
Admission: RE | Admit: 2024-05-12 | Discharge: 2024-05-12 | Attending: Obstetrics and Gynecology | Admitting: Obstetrics and Gynecology

## 2024-05-12 DIAGNOSIS — R928 Other abnormal and inconclusive findings on diagnostic imaging of breast: Secondary | ICD-10-CM

## 2024-05-18 ENCOUNTER — Ambulatory Visit: Payer: Self-pay | Admitting: Physician Assistant
